# Patient Record
Sex: Female | Born: 1960 | Race: White | Hispanic: No | Marital: Single | Smoking: Never smoker
Health system: Southern US, Community
[De-identification: ages and names within clinical notes are randomized; demographics above are authoritative.]

## PROBLEM LIST (undated history)

## (undated) DIAGNOSIS — F32A Depression, unspecified: Secondary | ICD-10-CM

## (undated) DIAGNOSIS — I878 Other specified disorders of veins: Secondary | ICD-10-CM

## (undated) DIAGNOSIS — G8929 Other chronic pain: Secondary | ICD-10-CM

## (undated) DIAGNOSIS — E119 Type 2 diabetes mellitus without complications: Secondary | ICD-10-CM

## (undated) DIAGNOSIS — F419 Anxiety disorder, unspecified: Secondary | ICD-10-CM

## (undated) DIAGNOSIS — B019 Varicella without complication: Secondary | ICD-10-CM

## (undated) DIAGNOSIS — I10 Essential (primary) hypertension: Secondary | ICD-10-CM

## (undated) DIAGNOSIS — Z8639 Personal history of other endocrine, nutritional and metabolic disease: Secondary | ICD-10-CM

## (undated) DIAGNOSIS — G4730 Sleep apnea, unspecified: Secondary | ICD-10-CM

## (undated) DIAGNOSIS — Z8669 Personal history of other diseases of the nervous system and sense organs: Secondary | ICD-10-CM

## (undated) DIAGNOSIS — F329 Major depressive disorder, single episode, unspecified: Secondary | ICD-10-CM

## (undated) DIAGNOSIS — D649 Anemia, unspecified: Secondary | ICD-10-CM

## (undated) HISTORY — DX: Essential (primary) hypertension: I10

## (undated) HISTORY — DX: Depression, unspecified: F32.A

## (undated) HISTORY — DX: Personal history of other endocrine, nutritional and metabolic disease: Z86.39

## (undated) HISTORY — DX: Personal history of other diseases of the nervous system and sense organs: Z86.69

## (undated) HISTORY — DX: Varicella without complication: B01.9

## (undated) HISTORY — DX: Other specified disorders of veins: I87.8

## (undated) HISTORY — DX: Major depressive disorder, single episode, unspecified: F32.9

## (undated) HISTORY — DX: Type 2 diabetes mellitus without complications: E11.9

---
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
## 1989-02-24 HISTORY — PX: APPENDECTOMY: SHX54

## 1989-02-24 HISTORY — PX: CHOLECYSTECTOMY: SHX55

## 2004-02-25 DIAGNOSIS — E119 Type 2 diabetes mellitus without complications: Secondary | ICD-10-CM

## 2004-02-25 HISTORY — PX: OTHER SURGICAL HISTORY: SHX169

## 2004-02-25 HISTORY — DX: Type 2 diabetes mellitus without complications: E11.9

## 2007-02-25 HISTORY — PX: HERNIA REPAIR: SHX51

## 2012-02-25 HISTORY — PX: OTHER SURGICAL HISTORY: SHX169

## 2012-02-25 HISTORY — PX: BELOW KNEE LEG AMPUTATION: SUR23

## 2013-03-30 ENCOUNTER — Other Ambulatory Visit: Payer: Self-pay

## 2013-03-30 LAB — VANCOMYCIN, TROUGH: VANCOMYCIN, TROUGH: 19 ug/mL (ref 10–20)

## 2013-07-09 DIAGNOSIS — Z89519 Acquired absence of unspecified leg below knee: Secondary | ICD-10-CM | POA: Insufficient documentation

## 2013-07-10 DIAGNOSIS — G8929 Other chronic pain: Secondary | ICD-10-CM | POA: Insufficient documentation

## 2013-08-21 ENCOUNTER — Other Ambulatory Visit: Payer: Self-pay | Admitting: Family Medicine

## 2013-08-21 LAB — URINALYSIS, COMPLETE
Bilirubin,UR: NEGATIVE
GLUCOSE, UR: NEGATIVE mg/dL (ref 0–75)
Ketone: NEGATIVE
Nitrite: NEGATIVE
PH: 6 (ref 4.5–8.0)
RBC,UR: 305 /HPF (ref 0–5)
Specific Gravity: 1.015 (ref 1.003–1.030)
Squamous Epithelial: 1
WBC UR: 307 /HPF (ref 0–5)

## 2013-08-22 LAB — URINE CULTURE

## 2013-08-26 ENCOUNTER — Other Ambulatory Visit: Payer: Self-pay | Admitting: Family Medicine

## 2013-08-26 LAB — URINALYSIS, COMPLETE
Bacteria: NEGATIVE
Bilirubin,UR: NEGATIVE
Glucose,UR: NEGATIVE mg/dL (ref 0–75)
Ketone: NEGATIVE
Nitrite: NEGATIVE
Ph: 5 (ref 4.5–8.0)
Protein: NEGATIVE
Specific Gravity: 1.016 (ref 1.003–1.030)

## 2013-08-27 LAB — URINE CULTURE

## 2013-11-24 ENCOUNTER — Ambulatory Visit: Payer: Self-pay | Admitting: Internal Medicine

## 2013-11-27 ENCOUNTER — Inpatient Hospital Stay: Payer: Self-pay | Admitting: Internal Medicine

## 2013-11-27 ENCOUNTER — Ambulatory Visit: Payer: Self-pay | Admitting: Urology

## 2013-11-27 LAB — URINALYSIS, COMPLETE
Bilirubin,UR: NEGATIVE
GLUCOSE, UR: NEGATIVE mg/dL (ref 0–75)
KETONE: NEGATIVE
Nitrite: NEGATIVE
Ph: 5 (ref 4.5–8.0)
Protein: NEGATIVE
Specific Gravity: 1.011 (ref 1.003–1.030)
Squamous Epithelial: 1

## 2013-11-27 LAB — CBC WITH DIFFERENTIAL/PLATELET
Bands: 6 %
HCT: 29.1 % — AB (ref 40.0–52.0)
HGB: 8.9 g/dL — AB (ref 13.0–18.0)
Lymphocytes: 3 %
MCH: 30.9 pg (ref 26.0–34.0)
MCHC: 30.5 g/dL — AB (ref 32.0–36.0)
MCV: 101 fL — AB (ref 80–100)
Metamyelocyte: 1 %
Monocytes: 4 %
Myelocyte: 1 %
Platelet: 244 10*3/uL (ref 150–440)
RBC: 2.87 10*6/uL — ABNORMAL LOW (ref 4.40–5.90)
RDW: 14.5 % (ref 11.5–14.5)
Segmented Neutrophils: 85 %
WBC: 20.3 10*3/uL — AB (ref 3.8–10.6)

## 2013-11-27 LAB — COMPREHENSIVE METABOLIC PANEL
Albumin: 1.3 g/dL — ABNORMAL LOW (ref 3.4–5.0)
Alkaline Phosphatase: 142 U/L — ABNORMAL HIGH
Anion Gap: 11 (ref 7–16)
BUN: 52 mg/dL — ABNORMAL HIGH (ref 7–18)
Bilirubin,Total: 0.4 mg/dL (ref 0.2–1.0)
CALCIUM: 6.7 mg/dL — AB (ref 8.5–10.1)
CO2: 12 mmol/L — AB (ref 21–32)
CREATININE: 2.48 mg/dL — AB (ref 0.60–1.30)
Chloride: 110 mmol/L — ABNORMAL HIGH (ref 98–107)
EGFR (African American): 35 — ABNORMAL LOW
EGFR (Non-African Amer.): 29 — ABNORMAL LOW
GLUCOSE: 81 mg/dL (ref 65–99)
Osmolality: 279 (ref 275–301)
Potassium: 5.4 mmol/L — ABNORMAL HIGH (ref 3.5–5.1)
SGOT(AST): 43 U/L — ABNORMAL HIGH (ref 15–37)
SGPT (ALT): 31 U/L
Sodium: 133 mmol/L — ABNORMAL LOW (ref 136–145)
TOTAL PROTEIN: 4.7 g/dL — AB (ref 6.4–8.2)

## 2013-11-27 LAB — TROPONIN I: Troponin-I: <0.02 ng/mL

## 2013-11-27 LAB — MAGNESIUM: Magnesium: 1.9 mg/dL

## 2013-11-27 LAB — PHOSPHORUS: PHOSPHORUS: 7.4 mg/dL — AB (ref 2.5–4.9)

## 2013-11-28 LAB — CBC WITH DIFFERENTIAL/PLATELET
BASOS ABS: 0 10*3/uL (ref 0.0–0.1)
Basophil %: 0.2 %
Eosinophil #: 0.1 10*3/uL (ref 0.0–0.7)
Eosinophil %: 0.2 %
HCT: 31.9 % — ABNORMAL LOW (ref 40.0–52.0)
HGB: 9.6 g/dL — ABNORMAL LOW (ref 13.0–18.0)
LYMPHS ABS: 0.7 10*3/uL — AB (ref 1.0–3.6)
Lymphocyte %: 3.5 %
MCH: 30.2 pg (ref 26.0–34.0)
MCHC: 30.2 g/dL — ABNORMAL LOW (ref 32.0–36.0)
MCV: 100 fL (ref 80–100)
Monocyte #: 0.6 x10 3/mm (ref 0.2–1.0)
Monocyte %: 2.7 %
Neutrophil #: 20.3 10*3/uL — ABNORMAL HIGH (ref 1.4–6.5)
Neutrophil %: 93.4 %
PLATELETS: 283 10*3/uL (ref 150–440)
RBC: 3.19 10*6/uL — AB (ref 4.40–5.90)
RDW: 13.9 % (ref 11.5–14.5)
WBC: 21.7 10*3/uL — ABNORMAL HIGH (ref 3.8–10.6)

## 2013-11-28 LAB — COMPREHENSIVE METABOLIC PANEL
ALBUMIN: 1.3 g/dL — AB (ref 3.4–5.0)
ALK PHOS: 157 U/L — AB
ALT: 35 U/L
ANION GAP: 9 (ref 7–16)
BILIRUBIN TOTAL: 0.6 mg/dL (ref 0.2–1.0)
BUN: 45 mg/dL — ABNORMAL HIGH (ref 7–18)
Calcium, Total: 6.4 mg/dL — CL (ref 8.5–10.1)
Chloride: 112 mmol/L — ABNORMAL HIGH (ref 98–107)
Co2: 14 mmol/L — ABNORMAL LOW (ref 21–32)
Creatinine: 2.01 mg/dL — ABNORMAL HIGH (ref 0.60–1.30)
EGFR (African American): 45 — ABNORMAL LOW
EGFR (Non-African Amer.): 37 — ABNORMAL LOW
Glucose: 106 mg/dL — ABNORMAL HIGH (ref 65–99)
OSMOLALITY: 282 (ref 275–301)
Potassium: 4.7 mmol/L (ref 3.5–5.1)
SGOT(AST): 44 U/L — ABNORMAL HIGH (ref 15–37)
SODIUM: 135 mmol/L — AB (ref 136–145)
Total Protein: 5.5 g/dL — ABNORMAL LOW (ref 6.4–8.2)

## 2013-11-28 LAB — IRON AND TIBC
IRON BIND. CAP.(TOTAL): 30 ug/dL — AB (ref 250–450)
Iron Saturation: 73 %
Iron: 22 ug/dL — ABNORMAL LOW (ref 65–175)
UNBOUND IRON-BIND. CAP.: 8 ug/dL

## 2013-11-28 LAB — PROTIME-INR
INR: 2.6
PROTHROMBIN TIME: 27 s — AB (ref 11.5–14.7)

## 2013-11-28 LAB — BASIC METABOLIC PANEL
ANION GAP: 4 — AB (ref 7–16)
BUN: 41 mg/dL — ABNORMAL HIGH (ref 7–18)
CALCIUM: 6.7 mg/dL — AB (ref 8.5–10.1)
CO2: 19 mmol/L — AB (ref 21–32)
Chloride: 114 mmol/L — ABNORMAL HIGH (ref 98–107)
Creatinine: 1.7 mg/dL — ABNORMAL HIGH (ref 0.60–1.30)
EGFR (African American): 55 — ABNORMAL LOW
EGFR (Non-African Amer.): 45 — ABNORMAL LOW
GLUCOSE: 103 mg/dL — AB (ref 65–99)
OSMOLALITY: 284 (ref 275–301)
Potassium: 4.6 mmol/L (ref 3.5–5.1)
Sodium: 137 mmol/L (ref 136–145)

## 2013-11-28 LAB — AMMONIA: Ammonia, Plasma: 56 mcmol/L — ABNORMAL HIGH (ref 11–32)

## 2013-11-28 LAB — TSH: Thyroid Stimulating Horm: 2.08 u[IU]/mL

## 2013-11-28 LAB — FERRITIN: Ferritin (ARMC): 1022 ng/mL — ABNORMAL HIGH (ref 8–388)

## 2013-11-28 LAB — SEDIMENTATION RATE: ERYTHROCYTE SED RATE: 28 mm/h — AB (ref 0–20)

## 2013-11-29 LAB — COMPREHENSIVE METABOLIC PANEL
ALT: 33 U/L
AST: 31 U/L (ref 15–37)
Albumin: 1 g/dL — ABNORMAL LOW (ref 3.4–5.0)
Alkaline Phosphatase: 124 U/L — ABNORMAL HIGH
Anion Gap: 7 (ref 7–16)
BUN: 36 mg/dL — ABNORMAL HIGH (ref 7–18)
Bilirubin,Total: 0.3 mg/dL (ref 0.2–1.0)
CHLORIDE: 116 mmol/L — AB (ref 98–107)
CREATININE: 1.44 mg/dL — AB (ref 0.60–1.30)
Calcium, Total: 6.5 mg/dL — CL (ref 8.5–10.1)
Co2: 16 mmol/L — ABNORMAL LOW (ref 21–32)
EGFR (African American): >60
EGFR (Non-African Amer.): 55 — ABNORMAL LOW
Glucose: 152 mg/dL — ABNORMAL HIGH (ref 65–99)
Osmolality: 289 (ref 275–301)
Potassium: 4.5 mmol/L (ref 3.5–5.1)
SODIUM: 139 mmol/L (ref 136–145)
TOTAL PROTEIN: 4.6 g/dL — AB (ref 6.4–8.2)

## 2013-11-29 LAB — WBC: WBC: 15.1 10*3/uL — ABNORMAL HIGH (ref 3.8–10.6)

## 2013-11-29 LAB — ALBUMIN: Albumin: 1 g/dL — ABNORMAL LOW (ref 3.4–5.0)

## 2013-11-29 LAB — PROTIME-INR
INR: 2.4
Prothrombin Time: 25.6 secs — ABNORMAL HIGH (ref 11.5–14.7)

## 2013-11-30 LAB — BASIC METABOLIC PANEL
Anion Gap: 5 — ABNORMAL LOW (ref 7–16)
BUN: 33 mg/dL — AB (ref 7–18)
CALCIUM: 6.7 mg/dL — AB (ref 8.5–10.1)
Chloride: 119 mmol/L — ABNORMAL HIGH (ref 98–107)
Co2: 19 mmol/L — ABNORMAL LOW (ref 21–32)
Creatinine: 1.15 mg/dL (ref 0.60–1.30)
EGFR (African American): >60
EGFR (Non-African Amer.): >60
GLUCOSE: 93 mg/dL (ref 65–99)
OSMOLALITY: 292 (ref 275–301)
POTASSIUM: 4.7 mmol/L (ref 3.5–5.1)
SODIUM: 143 mmol/L (ref 136–145)

## 2013-11-30 LAB — CBC WITH DIFFERENTIAL/PLATELET
BANDS NEUTROPHIL: 3 %
EOS PCT: 2 %
HCT: 27 % — AB (ref 40.0–52.0)
HGB: 8.5 g/dL — ABNORMAL LOW (ref 13.0–18.0)
LYMPHS PCT: 7 %
MCH: 31.1 pg (ref 26.0–34.0)
MCHC: 31.6 g/dL — AB (ref 32.0–36.0)
MCV: 99 fL (ref 80–100)
MONOS PCT: 4 %
Metamyelocyte: 2 %
Myelocyte: 3 %
Platelet: 147 10*3/uL — ABNORMAL LOW (ref 150–440)
Promyelocyte: 1 %
RBC: 2.75 10*6/uL — ABNORMAL LOW (ref 4.40–5.90)
RDW: 14.4 % (ref 11.5–14.5)
SEGMENTED NEUTROPHILS: 78 %
WBC: 9 10*3/uL (ref 3.8–10.6)

## 2013-12-01 LAB — URINE CULTURE

## 2013-12-02 LAB — CBC WITH DIFFERENTIAL/PLATELET
BASOS ABS: 0 10*3/uL (ref 0.0–0.1)
BASOS PCT: 0.1 %
Eosinophil #: 0.2 10*3/uL (ref 0.0–0.7)
Eosinophil %: 2 %
HCT: 28.4 % — AB (ref 40.0–52.0)
HGB: 8.9 g/dL — ABNORMAL LOW (ref 13.0–18.0)
Lymphocyte #: 0.9 10*3/uL — ABNORMAL LOW (ref 1.0–3.6)
Lymphocyte %: 8.3 %
MCH: 31 pg (ref 26.0–34.0)
MCHC: 31.5 g/dL — ABNORMAL LOW (ref 32.0–36.0)
MCV: 98 fL (ref 80–100)
Monocyte #: 0.6 x10 3/mm (ref 0.2–1.0)
Monocyte %: 5.4 %
Neutrophil #: 9.5 10*3/uL — ABNORMAL HIGH (ref 1.4–6.5)
Neutrophil %: 84.2 %
PLATELETS: 177 10*3/uL (ref 150–440)
RBC: 2.89 10*6/uL — ABNORMAL LOW (ref 4.40–5.90)
RDW: 14.7 % — AB (ref 11.5–14.5)
WBC: 11.3 10*3/uL — AB (ref 3.8–10.6)

## 2013-12-02 LAB — CULTURE, BLOOD (SINGLE)

## 2013-12-02 LAB — PROTIME-INR
INR: 2.1
Prothrombin Time: 23.4 secs — ABNORMAL HIGH (ref 11.5–14.7)

## 2013-12-02 LAB — WOUND AEROBIC CULTURE

## 2013-12-03 LAB — CBC WITH DIFFERENTIAL/PLATELET
BASOS ABS: 0.1 10*3/uL (ref 0.0–0.1)
Basophil %: 0.6 %
EOS PCT: 1.8 %
Eosinophil #: 0.2 10*3/uL (ref 0.0–0.7)
HCT: 29.4 % — ABNORMAL LOW (ref 40.0–52.0)
HGB: 8.9 g/dL — ABNORMAL LOW (ref 13.0–18.0)
Lymphocyte #: 1.2 10*3/uL (ref 1.0–3.6)
Lymphocyte %: 8.7 %
MCH: 29.8 pg (ref 26.0–34.0)
MCHC: 30.3 g/dL — AB (ref 32.0–36.0)
MCV: 98 fL (ref 80–100)
MONO ABS: 0.6 x10 3/mm (ref 0.2–1.0)
Monocyte %: 4.1 %
Neutrophil #: 11.6 10*3/uL — ABNORMAL HIGH (ref 1.4–6.5)
Neutrophil %: 84.8 %
PLATELETS: 232 10*3/uL (ref 150–440)
RBC: 2.99 10*6/uL — ABNORMAL LOW (ref 4.40–5.90)
RDW: 14.8 % — ABNORMAL HIGH (ref 11.5–14.5)
WBC: 13.7 10*3/uL — ABNORMAL HIGH (ref 3.8–10.6)

## 2013-12-03 LAB — WOUND AEROBIC CULTURE

## 2013-12-03 LAB — PROTIME-INR
INR: 1.8
PROTHROMBIN TIME: 20.8 s — AB (ref 11.5–14.7)

## 2013-12-04 LAB — CBC WITH DIFFERENTIAL/PLATELET
BANDS NEUTROPHIL: 1 %
Comment - H1-Com5: NORMAL
EOS PCT: 2 %
HCT: 29.3 % — ABNORMAL LOW (ref 40.0–52.0)
HGB: 9 g/dL — ABNORMAL LOW (ref 13.0–18.0)
Lymphocytes: 4 %
MCH: 30.1 pg (ref 26.0–34.0)
MCHC: 30.8 g/dL — AB (ref 32.0–36.0)
MCV: 98 fL (ref 80–100)
Metamyelocyte: 2 %
Monocytes: 5 %
Myelocyte: 3 %
PLATELETS: 261 10*3/uL (ref 150–440)
RBC: 3 10*6/uL — ABNORMAL LOW (ref 4.40–5.90)
RDW: 14.6 % — ABNORMAL HIGH (ref 11.5–14.5)
Segmented Neutrophils: 83 %
WBC: 15.3 10*3/uL — ABNORMAL HIGH (ref 3.8–10.6)

## 2013-12-04 LAB — PROTIME-INR
INR: 2
Prothrombin Time: 21.8 secs — ABNORMAL HIGH (ref 11.5–14.7)

## 2013-12-05 LAB — COMPREHENSIVE METABOLIC PANEL
ALBUMIN: 1 g/dL — AB (ref 3.4–5.0)
AST: 22 U/L (ref 15–37)
Alkaline Phosphatase: 118 U/L — ABNORMAL HIGH
Anion Gap: 0 — ABNORMAL LOW (ref 7–16)
BUN: 14 mg/dL (ref 7–18)
Bilirubin,Total: 0.2 mg/dL (ref 0.2–1.0)
CREATININE: 0.74 mg/dL (ref 0.60–1.30)
Calcium, Total: 6.4 mg/dL — CL (ref 8.5–10.1)
Chloride: 119 mmol/L — ABNORMAL HIGH (ref 98–107)
Co2: 23 mmol/L (ref 21–32)
EGFR (African American): >60
EGFR (Non-African Amer.): >60
Glucose: 103 mg/dL — ABNORMAL HIGH (ref 65–99)
Osmolality: 284 (ref 275–301)
Potassium: 4.2 mmol/L (ref 3.5–5.1)
SGPT (ALT): 25 U/L
SODIUM: 142 mmol/L (ref 136–145)
Total Protein: 4.6 g/dL — ABNORMAL LOW (ref 6.4–8.2)

## 2013-12-06 LAB — CULTURE, BLOOD (SINGLE)

## 2013-12-08 LAB — ALBUMIN: ALBUMIN: 1 g/dL — AB (ref 3.4–5.0)

## 2013-12-08 LAB — BASIC METABOLIC PANEL
Anion Gap: 4 — ABNORMAL LOW (ref 7–16)
BUN: 11 mg/dL (ref 7–18)
CALCIUM: 6.3 mg/dL — AB (ref 8.5–10.1)
CHLORIDE: 113 mmol/L — AB (ref 98–107)
Co2: 24 mmol/L (ref 21–32)
Creatinine: 0.68 mg/dL (ref 0.60–1.30)
Glucose: 135 mg/dL — ABNORMAL HIGH (ref 65–99)
Osmolality: 283 (ref 275–301)
POTASSIUM: 4.2 mmol/L (ref 3.5–5.1)
Sodium: 141 mmol/L (ref 136–145)

## 2013-12-08 LAB — PROTIME-INR
INR: 2
Prothrombin Time: 21.9 secs — ABNORMAL HIGH (ref 11.5–14.7)

## 2013-12-08 LAB — PHOSPHORUS: Phosphorus: 1.9 mg/dL — ABNORMAL LOW (ref 2.5–4.9)

## 2013-12-08 LAB — MAGNESIUM: MAGNESIUM: 1.6 mg/dL — AB

## 2013-12-09 LAB — COMPREHENSIVE METABOLIC PANEL
Albumin: 0.9 g/dL — ABNORMAL LOW (ref 3.4–5.0)
Alkaline Phosphatase: 129 U/L — ABNORMAL HIGH
Anion Gap: 5 — ABNORMAL LOW (ref 7–16)
BUN: 10 mg/dL (ref 7–18)
Bilirubin,Total: 0.2 mg/dL (ref 0.2–1.0)
Calcium, Total: 6.4 mg/dL — CL (ref 8.5–10.1)
Chloride: 111 mmol/L — ABNORMAL HIGH (ref 98–107)
Co2: 24 mmol/L (ref 21–32)
Creatinine: 0.73 mg/dL (ref 0.60–1.30)
EGFR (African American): >60
EGFR (Non-African Amer.): >60
Glucose: 134 mg/dL — ABNORMAL HIGH (ref 65–99)
Osmolality: 280 (ref 275–301)
Potassium: 4.4 mmol/L (ref 3.5–5.1)
SGOT(AST): 27 U/L (ref 15–37)
SGPT (ALT): 26 U/L
Sodium: 140 mmol/L (ref 136–145)
Total Protein: 4.8 g/dL — ABNORMAL LOW (ref 6.4–8.2)

## 2013-12-09 LAB — CBC WITH DIFFERENTIAL/PLATELET
BASOS PCT: 0.5 %
Basophil #: 0.1 10*3/uL (ref 0.0–0.1)
EOS ABS: 0.3 10*3/uL (ref 0.0–0.7)
Eosinophil %: 2.3 %
HCT: 28.4 % — ABNORMAL LOW (ref 40.0–52.0)
HGB: 8.7 g/dL — ABNORMAL LOW (ref 13.0–18.0)
LYMPHS ABS: 1.4 10*3/uL (ref 1.0–3.6)
Lymphocyte %: 12.1 %
MCH: 29.5 pg (ref 26.0–34.0)
MCHC: 30.7 g/dL — ABNORMAL LOW (ref 32.0–36.0)
MCV: 96 fL (ref 80–100)
MONO ABS: 0.8 x10 3/mm (ref 0.2–1.0)
MONOS PCT: 7.1 %
NEUTROS PCT: 78 %
Neutrophil #: 8.8 10*3/uL — ABNORMAL HIGH (ref 1.4–6.5)
Platelet: 288 10*3/uL (ref 150–440)
RBC: 2.95 10*6/uL — ABNORMAL LOW (ref 4.40–5.90)
RDW: 14.5 % (ref 11.5–14.5)
WBC: 11.3 10*3/uL — ABNORMAL HIGH (ref 3.8–10.6)

## 2013-12-09 LAB — MAGNESIUM: Magnesium: 1.7 mg/dL — ABNORMAL LOW

## 2013-12-09 LAB — PHOSPHORUS: Phosphorus: 1.9 mg/dL — ABNORMAL LOW (ref 2.5–4.9)

## 2013-12-10 LAB — PHOSPHORUS
PHOSPHORUS: 2.3 mg/dL — AB (ref 2.5–4.9)
Phosphorus: 2.1 mg/dL — ABNORMAL LOW (ref 2.5–4.9)

## 2013-12-10 LAB — BASIC METABOLIC PANEL
Anion Gap: 5 — ABNORMAL LOW (ref 7–16)
BUN: 10 mg/dL (ref 7–18)
CO2: 24 mmol/L (ref 21–32)
Calcium, Total: 6.3 mg/dL — CL (ref 8.5–10.1)
Chloride: 108 mmol/L — ABNORMAL HIGH (ref 98–107)
Creatinine: 0.76 mg/dL (ref 0.60–1.30)
EGFR (African American): >60
Glucose: 112 mg/dL — ABNORMAL HIGH (ref 65–99)
Osmolality: 274 (ref 275–301)
POTASSIUM: 4.4 mmol/L (ref 3.5–5.1)
SODIUM: 137 mmol/L (ref 136–145)

## 2013-12-10 LAB — MAGNESIUM: MAGNESIUM: 1.8 mg/dL

## 2013-12-11 LAB — COMPREHENSIVE METABOLIC PANEL
Albumin: 0.8 g/dL — ABNORMAL LOW (ref 3.4–5.0)
Alkaline Phosphatase: 110 U/L
Anion Gap: 5 — ABNORMAL LOW (ref 7–16)
BUN: 13 mg/dL (ref 7–18)
Bilirubin,Total: 0.1 mg/dL — ABNORMAL LOW (ref 0.2–1.0)
CHLORIDE: 108 mmol/L — AB (ref 98–107)
CO2: 24 mmol/L (ref 21–32)
Calcium, Total: 6 mg/dL — CL (ref 8.5–10.1)
Creatinine: 0.8 mg/dL (ref 0.60–1.30)
Glucose: 114 mg/dL — ABNORMAL HIGH (ref 65–99)
OSMOLALITY: 275 (ref 275–301)
POTASSIUM: 4.6 mmol/L (ref 3.5–5.1)
SGOT(AST): 21 U/L (ref 15–37)
SGPT (ALT): 20 U/L
Sodium: 137 mmol/L (ref 136–145)
TOTAL PROTEIN: 4.1 g/dL — AB (ref 6.4–8.2)

## 2013-12-11 LAB — MAGNESIUM: Magnesium: 2 mg/dL

## 2013-12-11 LAB — PHOSPHORUS
PHOSPHORUS: 2.6 mg/dL (ref 2.5–4.9)
Phosphorus: 2.4 mg/dL — ABNORMAL LOW (ref 2.5–4.9)

## 2013-12-12 LAB — BASIC METABOLIC PANEL
Anion Gap: 5 — ABNORMAL LOW (ref 7–16)
BUN: 17 mg/dL (ref 7–18)
CALCIUM: 6.3 mg/dL — AB (ref 8.5–10.1)
CO2: 23 mmol/L (ref 21–32)
Chloride: 108 mmol/L — ABNORMAL HIGH (ref 98–107)
Creatinine: 0.89 mg/dL (ref 0.60–1.30)
EGFR (African American): >60
Glucose: 118 mg/dL — ABNORMAL HIGH (ref 65–99)
Osmolality: 275 (ref 275–301)
Potassium: 4.6 mmol/L (ref 3.5–5.1)
Sodium: 136 mmol/L (ref 136–145)

## 2013-12-12 LAB — MAGNESIUM: Magnesium: 2.1 mg/dL

## 2013-12-12 LAB — PHOSPHORUS: Phosphorus: 3.2 mg/dL (ref 2.5–4.9)

## 2013-12-13 LAB — COMPREHENSIVE METABOLIC PANEL
Albumin: 1 g/dL — ABNORMAL LOW (ref 3.4–5.0)
Alkaline Phosphatase: 130 U/L — ABNORMAL HIGH
Anion Gap: 3 — ABNORMAL LOW (ref 7–16)
BUN: 23 mg/dL — ABNORMAL HIGH (ref 7–18)
Bilirubin,Total: 0.1 mg/dL — ABNORMAL LOW (ref 0.2–1.0)
CHLORIDE: 108 mmol/L — AB (ref 98–107)
Calcium, Total: 6.5 mg/dL — CL (ref 8.5–10.1)
Co2: 25 mmol/L (ref 21–32)
Creatinine: 1 mg/dL (ref 0.60–1.30)
GLUCOSE: 112 mg/dL — AB (ref 65–99)
Osmolality: 276 (ref 275–301)
POTASSIUM: 4.4 mmol/L (ref 3.5–5.1)
SGOT(AST): 23 U/L (ref 15–37)
SGPT (ALT): 22 U/L
SODIUM: 136 mmol/L (ref 136–145)
Total Protein: 4.9 g/dL — ABNORMAL LOW (ref 6.4–8.2)

## 2013-12-13 LAB — MAGNESIUM: Magnesium: 2 mg/dL

## 2013-12-13 LAB — PHOSPHORUS: Phosphorus: 2.5 mg/dL (ref 2.5–4.9)

## 2013-12-13 LAB — RAPID HIV SCREEN (HIV 1/2 AB+AG)

## 2013-12-14 DIAGNOSIS — I83029 Varicose veins of left lower extremity with ulcer of unspecified site: Secondary | ICD-10-CM | POA: Insufficient documentation

## 2013-12-14 DIAGNOSIS — L97929 Non-pressure chronic ulcer of unspecified part of left lower leg with unspecified severity: Secondary | ICD-10-CM

## 2013-12-25 ENCOUNTER — Ambulatory Visit: Payer: Self-pay | Admitting: Internal Medicine

## 2014-03-07 ENCOUNTER — Inpatient Hospital Stay
Admission: AD | Admit: 2014-03-07 | Discharge: 2014-03-27 | Disposition: A | Discharge status: Home Health | Payer: Self-pay | Source: Ambulatory Visit | Attending: Internal Medicine | Admitting: Internal Medicine

## 2014-03-08 LAB — COMPREHENSIVE METABOLIC PANEL
ALT: 23 U/L (ref 0–53)
AST: 45 U/L — ABNORMAL HIGH (ref 0–37)
Albumin: 2.5 g/dL — ABNORMAL LOW (ref 3.5–5.2)
Alkaline Phosphatase: 121 U/L — ABNORMAL HIGH (ref 39–117)
Anion gap: 12 (ref 5–15)
BUN: 36 mg/dL — ABNORMAL HIGH (ref 6–23)
CALCIUM: 8.5 mg/dL (ref 8.4–10.5)
CO2: 30 mmol/L (ref 19–32)
CREATININE: 0.84 mg/dL (ref 0.50–1.35)
Chloride: 92 mEq/L — ABNORMAL LOW (ref 96–112)
GFR calc non Af Amer: >90 mL/min (ref >90)
GLUCOSE: 121 mg/dL — AB (ref 70–99)
Potassium: 4.3 mmol/L (ref 3.5–5.1)
SODIUM: 134 mmol/L — AB (ref 135–145)
Total Bilirubin: 0.8 mg/dL (ref 0.3–1.2)
Total Protein: 8 g/dL (ref 6.0–8.3)

## 2014-03-08 LAB — LIPID PANEL
CHOL/HDL RATIO: 3.7 ratio
Cholesterol: 100 mg/dL (ref 0–200)
HDL: 27 mg/dL — AB (ref >39)
LDL CALC: 64 mg/dL (ref 0–99)
Triglycerides: 45 mg/dL (ref <150)
VLDL: 9 mg/dL (ref 0–40)

## 2014-03-08 LAB — CBC WITH DIFFERENTIAL/PLATELET
Basophils Absolute: 0 10*3/uL (ref 0.0–0.1)
Basophils Relative: 0 % (ref 0–1)
EOS PCT: 1 % (ref 0–5)
Eosinophils Absolute: 0.1 10*3/uL (ref 0.0–0.7)
HEMATOCRIT: 29.1 % — AB (ref 39.0–52.0)
HEMOGLOBIN: 9.6 g/dL — AB (ref 13.0–17.0)
LYMPHS ABS: 1.9 10*3/uL (ref 0.7–4.0)
Lymphocytes Relative: 16 % (ref 12–46)
MCH: 27.9 pg (ref 26.0–34.0)
MCHC: 33 g/dL (ref 30.0–36.0)
MCV: 84.6 fL (ref 78.0–100.0)
MONO ABS: 0.8 10*3/uL (ref 0.1–1.0)
MONOS PCT: 7 % (ref 3–12)
NEUTROS ABS: 8.7 10*3/uL — AB (ref 1.7–7.7)
Neutrophils Relative %: 76 % (ref 43–77)
Platelets: 472 10*3/uL — ABNORMAL HIGH (ref 150–400)
RBC: 3.44 MIL/uL — AB (ref 4.22–5.81)
RDW: 15.7 % — ABNORMAL HIGH (ref 11.5–15.5)
WBC: 11.6 10*3/uL — AB (ref 4.0–10.5)

## 2014-03-08 LAB — T4, FREE: FREE T4: 1.33 ng/dL (ref 0.80–1.80)

## 2014-03-08 LAB — PHOSPHORUS: Phosphorus: 6.2 mg/dL — ABNORMAL HIGH (ref 2.3–4.6)

## 2014-03-08 LAB — HEMOGLOBIN A1C
Hgb A1c MFr Bld: 5.3 % (ref <5.7)
Hgb A1c MFr Bld: 5.4 % (ref <5.7)
Mean Plasma Glucose: 105 mg/dL (ref <117)
Mean Plasma Glucose: 108 mg/dL (ref <117)

## 2014-03-08 LAB — VITAMIN B12: VITAMIN B 12: 1377 pg/mL — AB (ref 211–911)

## 2014-03-08 LAB — MAGNESIUM: MAGNESIUM: 1.4 mg/dL — AB (ref 1.5–2.5)

## 2014-03-08 LAB — TSH: TSH: 4.142 u[IU]/mL (ref 0.350–4.500)

## 2014-03-09 LAB — CBC WITH DIFFERENTIAL/PLATELET
BASOS PCT: 0 % (ref 0–1)
Basophils Absolute: 0 10*3/uL (ref 0.0–0.1)
Eosinophils Absolute: 0.1 10*3/uL (ref 0.0–0.7)
Eosinophils Relative: 1 % (ref 0–5)
HEMATOCRIT: 26.4 % — AB (ref 39.0–52.0)
Hemoglobin: 8.4 g/dL — ABNORMAL LOW (ref 13.0–17.0)
LYMPHS ABS: 1.9 10*3/uL (ref 0.7–4.0)
LYMPHS PCT: 17 % (ref 12–46)
MCH: 27.3 pg (ref 26.0–34.0)
MCHC: 31.8 g/dL (ref 30.0–36.0)
MCV: 85.7 fL (ref 78.0–100.0)
MONO ABS: 0.7 10*3/uL (ref 0.1–1.0)
MONOS PCT: 6 % (ref 3–12)
NEUTROS PCT: 76 % (ref 43–77)
Neutro Abs: 8.3 10*3/uL — ABNORMAL HIGH (ref 1.7–7.7)
Platelets: 382 10*3/uL (ref 150–400)
RBC: 3.08 MIL/uL — ABNORMAL LOW (ref 4.22–5.81)
RDW: 15.7 % — ABNORMAL HIGH (ref 11.5–15.5)
WBC: 11 10*3/uL — AB (ref 4.0–10.5)

## 2014-03-09 LAB — FOLATE RBC
Folate, Hemolysate: >619 ng/mL
Folate, RBC: >2134 ng/mL (ref >498)
Hematocrit: 29 % — ABNORMAL LOW (ref 37.5–51.0)

## 2014-03-09 LAB — BASIC METABOLIC PANEL
Anion gap: 13 (ref 5–15)
BUN: 54 mg/dL — ABNORMAL HIGH (ref 6–23)
CHLORIDE: 90 meq/L — AB (ref 96–112)
CO2: 31 mmol/L (ref 19–32)
CREATININE: 0.84 mg/dL (ref 0.50–1.35)
Calcium: 8.4 mg/dL (ref 8.4–10.5)
GFR calc non Af Amer: >90 mL/min (ref >90)
Glucose, Bld: 115 mg/dL — ABNORMAL HIGH (ref 70–99)
POTASSIUM: 4.6 mmol/L (ref 3.5–5.1)
Sodium: 134 mmol/L — ABNORMAL LOW (ref 135–145)

## 2014-03-09 LAB — PREALBUMIN: PREALBUMIN: 7.3 mg/dL — AB (ref 17.0–34.0)

## 2014-03-09 LAB — PROTIME-INR
INR: 1.27 (ref 0.00–1.49)
Prothrombin Time: 16 seconds — ABNORMAL HIGH (ref 11.6–15.2)

## 2014-03-09 LAB — MAGNESIUM: MAGNESIUM: 1.6 mg/dL (ref 1.5–2.5)

## 2014-03-09 LAB — IRON AND TIBC
Iron: 26 ug/dL — ABNORMAL LOW (ref 42–165)
Saturation Ratios: 15 % — ABNORMAL LOW (ref 20–55)
TIBC: 169 ug/dL — AB (ref 215–435)
UIBC: 143 ug/dL (ref 125–400)

## 2014-03-09 LAB — PHOSPHORUS: Phosphorus: 5.1 mg/dL — ABNORMAL HIGH (ref 2.3–4.6)

## 2014-03-09 LAB — FERRITIN: Ferritin: 558 ng/mL — ABNORMAL HIGH (ref 22–322)

## 2014-03-10 LAB — BASIC METABOLIC PANEL
Anion gap: 11 (ref 5–15)
BUN: 51 mg/dL — AB (ref 6–23)
CALCIUM: 8 mg/dL — AB (ref 8.4–10.5)
CO2: 29 mmol/L (ref 19–32)
CREATININE: 0.76 mg/dL (ref 0.50–1.35)
Chloride: 94 mEq/L — ABNORMAL LOW (ref 96–112)
GFR calc Af Amer: >90 mL/min (ref >90)
GFR calc non Af Amer: >90 mL/min (ref >90)
Glucose, Bld: 116 mg/dL — ABNORMAL HIGH (ref 70–99)
Potassium: 3.8 mmol/L (ref 3.5–5.1)
SODIUM: 134 mmol/L — AB (ref 135–145)

## 2014-03-10 LAB — CBC
HEMATOCRIT: 26.6 % — AB (ref 39.0–52.0)
HEMOGLOBIN: 8.4 g/dL — AB (ref 13.0–17.0)
MCH: 26.8 pg (ref 26.0–34.0)
MCHC: 31.6 g/dL (ref 30.0–36.0)
MCV: 85 fL (ref 78.0–100.0)
PLATELETS: 389 10*3/uL (ref 150–400)
RBC: 3.13 MIL/uL — ABNORMAL LOW (ref 4.22–5.81)
RDW: 15.4 % (ref 11.5–15.5)
WBC: 9.6 10*3/uL (ref 4.0–10.5)

## 2014-03-10 LAB — SEDIMENTATION RATE: Sed Rate: 135 mm/hr — ABNORMAL HIGH (ref 0–16)

## 2014-03-10 LAB — VITAMIN D 25 HYDROXY (VIT D DEFICIENCY, FRACTURES): VIT D 25 HYDROXY: 14.5 ng/mL — AB (ref 30.0–100.0)

## 2014-03-11 LAB — C-REACTIVE PROTEIN: CRP: 6.5 mg/dL — AB (ref <0.60)

## 2014-03-12 LAB — COMPREHENSIVE METABOLIC PANEL
ALK PHOS: 108 U/L (ref 39–117)
ALT: 25 U/L (ref 0–53)
AST: 31 U/L (ref 0–37)
Albumin: 2.3 g/dL — ABNORMAL LOW (ref 3.5–5.2)
Anion gap: 10 (ref 5–15)
BUN: 46 mg/dL — AB (ref 6–23)
CALCIUM: 8.3 mg/dL — AB (ref 8.4–10.5)
CO2: 28 mmol/L (ref 19–32)
Chloride: 95 mEq/L — ABNORMAL LOW (ref 96–112)
Creatinine, Ser: 0.75 mg/dL (ref 0.50–1.35)
GFR calc non Af Amer: >90 mL/min (ref >90)
Glucose, Bld: 117 mg/dL — ABNORMAL HIGH (ref 70–99)
POTASSIUM: 4 mmol/L (ref 3.5–5.1)
Sodium: 133 mmol/L — ABNORMAL LOW (ref 135–145)
Total Bilirubin: 0.4 mg/dL (ref 0.3–1.2)
Total Protein: 7.6 g/dL (ref 6.0–8.3)

## 2014-03-12 LAB — MAGNESIUM: MAGNESIUM: 1.6 mg/dL (ref 1.5–2.5)

## 2014-03-12 LAB — PHOSPHORUS: Phosphorus: 4.4 mg/dL (ref 2.3–4.6)

## 2014-03-15 LAB — CBC
HCT: 29.4 % — ABNORMAL LOW (ref 39.0–52.0)
HEMOGLOBIN: 9.2 g/dL — AB (ref 13.0–17.0)
MCH: 26.3 pg (ref 26.0–34.0)
MCHC: 31.3 g/dL (ref 30.0–36.0)
MCV: 84 fL (ref 78.0–100.0)
Platelets: 408 10*3/uL — ABNORMAL HIGH (ref 150–400)
RBC: 3.5 MIL/uL — AB (ref 4.22–5.81)
RDW: 15.6 % — AB (ref 11.5–15.5)
WBC: 10.3 10*3/uL (ref 4.0–10.5)

## 2014-03-15 LAB — BASIC METABOLIC PANEL
Anion gap: 11 (ref 5–15)
BUN: 60 mg/dL — AB (ref 6–23)
CHLORIDE: 95 meq/L — AB (ref 96–112)
CO2: 28 mmol/L (ref 19–32)
Calcium: 8.3 mg/dL — ABNORMAL LOW (ref 8.4–10.5)
Creatinine, Ser: 0.86 mg/dL (ref 0.50–1.35)
GFR calc non Af Amer: >90 mL/min (ref >90)
Glucose, Bld: 144 mg/dL — ABNORMAL HIGH (ref 70–99)
POTASSIUM: 4 mmol/L (ref 3.5–5.1)
SODIUM: 134 mmol/L — AB (ref 135–145)

## 2014-03-16 LAB — RENAL FUNCTION PANEL
Albumin: 2.4 g/dL — ABNORMAL LOW (ref 3.5–5.2)
Anion gap: 7 (ref 5–15)
BUN: 59 mg/dL — AB (ref 6–23)
CO2: 28 mmol/L (ref 19–32)
Calcium: 8.1 mg/dL — ABNORMAL LOW (ref 8.4–10.5)
Chloride: 97 mEq/L (ref 96–112)
Creatinine, Ser: 0.89 mg/dL (ref 0.50–1.35)
GFR calc Af Amer: >90 mL/min (ref >90)
GFR calc non Af Amer: >90 mL/min (ref >90)
GLUCOSE: 122 mg/dL — AB (ref 70–99)
PHOSPHORUS: 4.4 mg/dL (ref 2.3–4.6)
POTASSIUM: 4 mmol/L (ref 3.5–5.1)
Sodium: 132 mmol/L — ABNORMAL LOW (ref 135–145)

## 2014-03-16 LAB — HEPATIC FUNCTION PANEL
ALBUMIN: 2.4 g/dL — AB (ref 3.5–5.2)
ALK PHOS: 120 U/L — AB (ref 39–117)
ALT: 38 U/L (ref 0–53)
AST: 49 U/L — ABNORMAL HIGH (ref 0–37)
Bilirubin, Direct: <0.1 mg/dL (ref 0.0–0.5)
TOTAL PROTEIN: 7.6 g/dL (ref 6.0–8.3)
Total Bilirubin: 0.5 mg/dL (ref 0.3–1.2)

## 2014-03-16 LAB — TRIGLYCERIDES: TRIGLYCERIDES: 57 mg/dL (ref <150)

## 2014-03-16 LAB — MAGNESIUM: Magnesium: 1.9 mg/dL (ref 1.5–2.5)

## 2014-03-18 LAB — COMPREHENSIVE METABOLIC PANEL
ALT: 32 U/L (ref 0–53)
AST: 33 U/L (ref 0–37)
Albumin: 2.4 g/dL — ABNORMAL LOW (ref 3.5–5.2)
Alkaline Phosphatase: 111 U/L (ref 39–117)
Anion gap: 8 (ref 5–15)
BUN: 53 mg/dL — AB (ref 6–23)
CALCIUM: 7.8 mg/dL — AB (ref 8.4–10.5)
CO2: 27 mmol/L (ref 19–32)
Chloride: 95 mmol/L — ABNORMAL LOW (ref 96–112)
Creatinine, Ser: 0.84 mg/dL (ref 0.50–1.35)
GFR calc non Af Amer: >90 mL/min (ref >90)
Glucose, Bld: 158 mg/dL — ABNORMAL HIGH (ref 70–99)
Potassium: 3.7 mmol/L (ref 3.5–5.1)
Sodium: 130 mmol/L — ABNORMAL LOW (ref 135–145)
Total Bilirubin: 0.3 mg/dL (ref 0.3–1.2)
Total Protein: 7.4 g/dL (ref 6.0–8.3)

## 2014-03-19 LAB — CBC
HCT: 25.5 % — ABNORMAL LOW (ref 39.0–52.0)
Hemoglobin: 7.9 g/dL — ABNORMAL LOW (ref 13.0–17.0)
MCH: 26.2 pg (ref 26.0–34.0)
MCHC: 31 g/dL (ref 30.0–36.0)
MCV: 84.4 fL (ref 78.0–100.0)
Platelets: 291 10*3/uL (ref 150–400)
RBC: 3.02 MIL/uL — ABNORMAL LOW (ref 4.22–5.81)
RDW: 15.7 % — ABNORMAL HIGH (ref 11.5–15.5)
WBC: 7.3 10*3/uL (ref 4.0–10.5)

## 2014-03-19 LAB — MAGNESIUM: MAGNESIUM: 2 mg/dL (ref 1.5–2.5)

## 2014-03-20 LAB — BASIC METABOLIC PANEL
Anion gap: 7 (ref 5–15)
BUN: 47 mg/dL — ABNORMAL HIGH (ref 6–23)
CO2: 27 mmol/L (ref 19–32)
CREATININE: 0.67 mg/dL (ref 0.50–1.35)
Calcium: 8 mg/dL — ABNORMAL LOW (ref 8.4–10.5)
Chloride: 99 mmol/L (ref 96–112)
GLUCOSE: 138 mg/dL — AB (ref 70–99)
Potassium: 3.8 mmol/L (ref 3.5–5.1)
Sodium: 133 mmol/L — ABNORMAL LOW (ref 135–145)

## 2014-03-20 LAB — MAGNESIUM: Magnesium: 2 mg/dL (ref 1.5–2.5)

## 2014-03-21 LAB — IRON: IRON: 24 ug/dL — AB (ref 42–165)

## 2014-03-21 LAB — VITAMIN B12: VITAMIN B 12: 843 pg/mL (ref 211–911)

## 2014-03-21 LAB — FERRITIN: Ferritin: 478 ng/mL — ABNORMAL HIGH (ref 22–322)

## 2014-03-23 LAB — TRIGLYCERIDES: Triglycerides: 42 mg/dL (ref <150)

## 2014-03-23 LAB — RENAL FUNCTION PANEL
Albumin: 2.1 g/dL — ABNORMAL LOW (ref 3.5–5.2)
Anion gap: 5 (ref 5–15)
BUN: 40 mg/dL — ABNORMAL HIGH (ref 6–23)
CALCIUM: 7.6 mg/dL — AB (ref 8.4–10.5)
CO2: 26 mmol/L (ref 19–32)
CREATININE: 0.61 mg/dL (ref 0.50–1.35)
Chloride: 103 mmol/L (ref 96–112)
GFR calc Af Amer: >90 mL/min (ref >90)
GFR calc non Af Amer: >90 mL/min (ref >90)
Glucose, Bld: 75 mg/dL (ref 70–99)
POTASSIUM: 3.8 mmol/L (ref 3.5–5.1)
Phosphorus: 4.1 mg/dL (ref 2.3–4.6)
SODIUM: 134 mmol/L — AB (ref 135–145)

## 2014-03-23 LAB — HEPATIC FUNCTION PANEL
ALBUMIN: 2.1 g/dL — AB (ref 3.5–5.2)
ALK PHOS: 93 U/L (ref 39–117)
ALT: 22 U/L (ref 0–53)
AST: 23 U/L (ref 0–37)
BILIRUBIN TOTAL: 0.4 mg/dL (ref 0.3–1.2)
Bilirubin, Direct: <0.1 mg/dL (ref 0.0–0.5)
Total Protein: 6.4 g/dL (ref 6.0–8.3)

## 2014-03-23 LAB — MAGNESIUM: Magnesium: 1.8 mg/dL (ref 1.5–2.5)

## 2014-03-25 LAB — COMPREHENSIVE METABOLIC PANEL
ALT: 19 U/L (ref 0–53)
ANION GAP: 5 (ref 5–15)
AST: 25 U/L (ref 0–37)
Albumin: 2.2 g/dL — ABNORMAL LOW (ref 3.5–5.2)
Alkaline Phosphatase: 104 U/L (ref 39–117)
BILIRUBIN TOTAL: 0.5 mg/dL (ref 0.3–1.2)
BUN: 42 mg/dL — ABNORMAL HIGH (ref 6–23)
CHLORIDE: 102 mmol/L (ref 96–112)
CO2: 26 mmol/L (ref 19–32)
Calcium: 7.9 mg/dL — ABNORMAL LOW (ref 8.4–10.5)
Creatinine, Ser: 0.7 mg/dL (ref 0.50–1.35)
Glucose, Bld: 148 mg/dL — ABNORMAL HIGH (ref 70–99)
Potassium: 4.6 mmol/L (ref 3.5–5.1)
Sodium: 133 mmol/L — ABNORMAL LOW (ref 135–145)
TOTAL PROTEIN: 6.9 g/dL (ref 6.0–8.3)

## 2014-03-25 LAB — CBC
HCT: 26.6 % — ABNORMAL LOW (ref 39.0–52.0)
Hemoglobin: 8.2 g/dL — ABNORMAL LOW (ref 13.0–17.0)
MCH: 26.4 pg (ref 26.0–34.0)
MCHC: 30.8 g/dL (ref 30.0–36.0)
MCV: 85.5 fL (ref 78.0–100.0)
PLATELETS: 327 10*3/uL (ref 150–400)
RBC: 3.11 MIL/uL — ABNORMAL LOW (ref 4.22–5.81)
RDW: 16 % — ABNORMAL HIGH (ref 11.5–15.5)
WBC: 9.3 10*3/uL (ref 4.0–10.5)

## 2014-03-27 ENCOUNTER — Ambulatory Visit
Admit: 2014-03-27 | Disposition: A | Discharge status: Home / Self Care | Payer: Self-pay | Attending: Hospice and Palliative Medicine | Admitting: Hospice and Palliative Medicine

## 2014-03-27 LAB — HEPATIC FUNCTION PANEL
ALT: 21 U/L (ref 0–53)
AST: 26 U/L (ref 0–37)
Albumin: 2.3 g/dL — ABNORMAL LOW (ref 3.5–5.2)
Alkaline Phosphatase: 105 U/L (ref 39–117)
Bilirubin, Direct: <0.1 mg/dL (ref 0.0–0.5)
Total Bilirubin: 0.3 mg/dL (ref 0.3–1.2)
Total Protein: 7.4 g/dL (ref 6.0–8.3)

## 2014-03-27 LAB — RENAL FUNCTION PANEL
Albumin: 2.3 g/dL — ABNORMAL LOW (ref 3.5–5.2)
Anion gap: 7 (ref 5–15)
BUN: 49 mg/dL — ABNORMAL HIGH (ref 6–23)
CALCIUM: 7.9 mg/dL — AB (ref 8.4–10.5)
CO2: 27 mmol/L (ref 19–32)
CREATININE: 0.86 mg/dL (ref 0.50–1.35)
Chloride: 100 mmol/L (ref 96–112)
GFR calc Af Amer: >90 mL/min (ref >90)
GFR calc non Af Amer: >90 mL/min (ref >90)
Glucose, Bld: 133 mg/dL — ABNORMAL HIGH (ref 70–99)
POTASSIUM: 4.1 mmol/L (ref 3.5–5.1)
Phosphorus: 4.3 mg/dL (ref 2.3–4.6)
SODIUM: 134 mmol/L — AB (ref 135–145)

## 2014-03-27 LAB — TRIGLYCERIDES: TRIGLYCERIDES: 53 mg/dL (ref <150)

## 2014-03-27 LAB — MAGNESIUM: Magnesium: 2 mg/dL (ref 1.5–2.5)

## 2014-04-01 ENCOUNTER — Emergency Department: Payer: Self-pay | Admitting: Emergency Medicine

## 2014-04-01 LAB — CBC WITH DIFFERENTIAL/PLATELET
BASOS ABS: 0 10*3/uL (ref 0.0–0.1)
Basophil %: 0.5 %
EOS ABS: 0 10*3/uL (ref 0.0–0.7)
Eosinophil %: 0.1 %
HCT: 27.3 % — AB (ref 40.0–52.0)
HGB: 8.8 g/dL — ABNORMAL LOW (ref 13.0–18.0)
Lymphocyte #: 0.4 10*3/uL — ABNORMAL LOW (ref 1.0–3.6)
Lymphocyte %: 3.8 %
MCH: 26.3 pg (ref 26.0–34.0)
MCHC: 32.1 g/dL (ref 32.0–36.0)
MCV: 82 fL (ref 80–100)
MONOS PCT: 4.2 %
Monocyte #: 0.4 x10 3/mm (ref 0.2–1.0)
NEUTROS ABS: 8.5 10*3/uL — AB (ref 1.4–6.5)
Neutrophil %: 91.4 %
Platelet: 287 10*3/uL (ref 150–440)
RBC: 3.33 10*6/uL — ABNORMAL LOW (ref 4.40–5.90)
RDW: 16.9 % — ABNORMAL HIGH (ref 11.5–14.5)
WBC: 9.3 10*3/uL (ref 3.8–10.6)

## 2014-04-01 LAB — MAGNESIUM: MAGNESIUM: 1.6 mg/dL — AB

## 2014-04-01 LAB — URINALYSIS, COMPLETE
BACTERIA: NONE SEEN
Bilirubin,UR: NEGATIVE
Blood: NEGATIVE
GLUCOSE, UR: NEGATIVE mg/dL (ref 0–75)
KETONE: NEGATIVE
Leukocyte Esterase: NEGATIVE
Nitrite: NEGATIVE
Ph: 5 (ref 4.5–8.0)
Protein: NEGATIVE
RBC,UR: 1 /HPF (ref 0–5)
Specific Gravity: 1.006 (ref 1.003–1.030)
Squamous Epithelial: <1

## 2014-04-01 LAB — COMPREHENSIVE METABOLIC PANEL
ALK PHOS: 144 U/L — AB (ref 46–116)
ALT: 31 U/L (ref 14–63)
AST: 39 U/L — AB (ref 15–37)
Albumin: 2.2 g/dL — ABNORMAL LOW (ref 3.4–5.0)
Anion Gap: 7 (ref 7–16)
BUN: 39 mg/dL — AB (ref 7–18)
Bilirubin,Total: 0.4 mg/dL (ref 0.2–1.0)
CHLORIDE: 99 mmol/L (ref 98–107)
Calcium, Total: 7.5 mg/dL — ABNORMAL LOW (ref 8.5–10.1)
Co2: 26 mmol/L (ref 21–32)
Creatinine: 0.88 mg/dL (ref 0.60–1.30)
EGFR (African American): >60
Glucose: 120 mg/dL — ABNORMAL HIGH (ref 65–99)
Osmolality: 275 (ref 275–301)
Potassium: 3.7 mmol/L (ref 3.5–5.1)
Sodium: 132 mmol/L — ABNORMAL LOW (ref 136–145)
TOTAL PROTEIN: 7.4 g/dL (ref 6.4–8.2)

## 2014-04-01 LAB — PROTIME-INR
INR: 1.2
PROTHROMBIN TIME: 15.4 s — AB

## 2014-04-01 LAB — PHOSPHORUS: Phosphorus: 3.5 mg/dL (ref 2.5–4.9)

## 2014-04-01 LAB — TROPONIN I: Troponin-I: 0.03 ng/mL

## 2014-04-02 ENCOUNTER — Emergency Department: Payer: Self-pay | Admitting: Internal Medicine

## 2014-04-02 LAB — COMPREHENSIVE METABOLIC PANEL
AST: 32 U/L (ref 15–37)
Albumin: 2.1 g/dL — ABNORMAL LOW (ref 3.4–5.0)
Alkaline Phosphatase: 130 U/L — ABNORMAL HIGH (ref 46–116)
Anion Gap: 7 (ref 7–16)
BUN: 35 mg/dL — ABNORMAL HIGH (ref 7–18)
Bilirubin,Total: 0.3 mg/dL (ref 0.2–1.0)
CO2: 27 mmol/L (ref 21–32)
Calcium, Total: 7.6 mg/dL — ABNORMAL LOW (ref 8.5–10.1)
Chloride: 100 mmol/L (ref 98–107)
Creatinine: 0.95 mg/dL (ref 0.60–1.30)
GLUCOSE: 119 mg/dL — AB (ref 65–99)
OSMOLALITY: 277 (ref 275–301)
Potassium: 3.8 mmol/L (ref 3.5–5.1)
SGPT (ALT): 27 U/L (ref 14–63)
Sodium: 134 mmol/L — ABNORMAL LOW (ref 136–145)
Total Protein: 7 g/dL (ref 6.4–8.2)

## 2014-04-02 LAB — CBC WITH DIFFERENTIAL/PLATELET
Basophil #: 0 10*3/uL (ref 0.0–0.1)
Basophil %: 0.5 %
EOS PCT: 1.4 %
Eosinophil #: 0.1 10*3/uL (ref 0.0–0.7)
HCT: 27.9 % — ABNORMAL LOW (ref 40.0–52.0)
HGB: 8.9 g/dL — ABNORMAL LOW (ref 13.0–18.0)
LYMPHS PCT: 13.4 %
Lymphocyte #: 0.7 10*3/uL — ABNORMAL LOW (ref 1.0–3.6)
MCH: 26 pg (ref 26.0–34.0)
MCHC: 31.9 g/dL — ABNORMAL LOW (ref 32.0–36.0)
MCV: 81 fL (ref 80–100)
MONOS PCT: 10.2 %
Monocyte #: 0.5 x10 3/mm (ref 0.2–1.0)
NEUTROS PCT: 74.5 %
Neutrophil #: 4 10*3/uL (ref 1.4–6.5)
Platelet: 267 10*3/uL (ref 150–440)
RBC: 3.43 10*6/uL — ABNORMAL LOW (ref 4.40–5.90)
RDW: 17 % — AB (ref 11.5–14.5)
WBC: 5.4 10*3/uL (ref 3.8–10.6)

## 2014-04-02 LAB — PROTIME-INR
INR: 1.2
Prothrombin Time: 15.7 secs — ABNORMAL HIGH

## 2014-04-02 LAB — CULTURE, BLOOD (SINGLE)

## 2014-04-03 ENCOUNTER — Inpatient Hospital Stay: Payer: Self-pay | Admitting: Internal Medicine

## 2014-04-03 LAB — COMPREHENSIVE METABOLIC PANEL
ALK PHOS: 135 U/L — AB (ref 46–116)
Albumin: 2 g/dL — ABNORMAL LOW (ref 3.4–5.0)
Anion Gap: 8 (ref 7–16)
BUN: 39 mg/dL — ABNORMAL HIGH (ref 7–18)
Bilirubin,Total: 0.3 mg/dL (ref 0.2–1.0)
CO2: 27 mmol/L (ref 21–32)
CREATININE: 0.96 mg/dL (ref 0.60–1.30)
Calcium, Total: 7.7 mg/dL — ABNORMAL LOW (ref 8.5–10.1)
Chloride: 101 mmol/L (ref 98–107)
Glucose: 112 mg/dL — ABNORMAL HIGH (ref 65–99)
Osmolality: 282 (ref 275–301)
Potassium: 3.7 mmol/L (ref 3.5–5.1)
SGOT(AST): 38 U/L — ABNORMAL HIGH (ref 15–37)
SGPT (ALT): 30 U/L (ref 14–63)
SODIUM: 136 mmol/L (ref 136–145)
TOTAL PROTEIN: 7 g/dL (ref 6.4–8.2)

## 2014-04-03 LAB — CBC WITH DIFFERENTIAL/PLATELET
BASOS ABS: 0 10*3/uL (ref 0.0–0.1)
Basophil %: 0.5 %
EOS ABS: 0.1 10*3/uL (ref 0.0–0.7)
Eosinophil %: 2.1 %
HCT: 26.2 % — ABNORMAL LOW (ref 40.0–52.0)
HGB: 8.2 g/dL — ABNORMAL LOW (ref 13.0–18.0)
Lymphocyte #: 0.9 10*3/uL — ABNORMAL LOW (ref 1.0–3.6)
Lymphocyte %: 14.7 %
MCH: 25.7 pg — ABNORMAL LOW (ref 26.0–34.0)
MCHC: 31.4 g/dL — ABNORMAL LOW (ref 32.0–36.0)
MCV: 82 fL (ref 80–100)
MONOS PCT: 12.8 %
Monocyte #: 0.8 x10 3/mm (ref 0.2–1.0)
Neutrophil #: 4.3 10*3/uL (ref 1.4–6.5)
Neutrophil %: 69.9 %
PLATELETS: 271 10*3/uL (ref 150–440)
RBC: 3.2 10*6/uL — ABNORMAL LOW (ref 4.40–5.90)
RDW: 17.1 % — AB (ref 11.5–14.5)
WBC: 6.1 10*3/uL (ref 3.8–10.6)

## 2014-04-03 LAB — TROPONIN I: Troponin-I: 0.03 ng/mL

## 2014-04-03 LAB — CULTURE, BLOOD (SINGLE)

## 2014-04-03 LAB — MAGNESIUM: MAGNESIUM: 1.7 mg/dL — AB

## 2014-04-04 DIAGNOSIS — I38 Endocarditis, valve unspecified: Secondary | ICD-10-CM

## 2014-04-17 ENCOUNTER — Inpatient Hospital Stay: Payer: Self-pay | Admitting: Internal Medicine

## 2014-05-26 ENCOUNTER — Ambulatory Visit
Admit: 2014-05-26 | Disposition: A | Discharge status: Home / Self Care | Payer: Self-pay | Attending: Hospice and Palliative Medicine | Admitting: Hospice and Palliative Medicine

## 2014-06-17 NOTE — Op Note (Signed)
PATIENT NAME:  [name], [name] MR#:  [id number] DATE OF BIRTH:  [birth year]  DATE OF PROCEDURE:  11/28/2013  PREOPERATIVE DIAGNOSIS:  Sepsis.   POSTOPERATIVE DIAGNOSIS:  Sepsis.   PROCEDURE:  Right subclavian vein central line placement with ultrasound guidance.   SURGEON:  Richard E. Excell Seltzerooper, MD   ANESTHESIA:  Local anesthetic.   INDICATIONS:  This is a patient who is in septic shock requiring pressors and volume resuscitation and central line placement. Preoperatively, I discussed the rationale for the placement and the risks associated with the procedure and consent had been obtained by the nursing staff.   FINDINGS:  Right subclavian vein approach was utilized due to the patient's severe kyphoscoliosis and inability to access the neck easily in the left subclavian area. Also, he had had multiple surgeries for weight reduction apparently and had scars throughout his abdomen and groin, and with ultrasound guidance, I could not identify any identifiable arterial or venous flow or palpable pulse in the groins. Therefore, with ultrasound guidance, the right subclavian vein was visualized.   DESCRIPTION OF PROCEDURE:  The patient was properly identified in the intensive care unit. Attempts at choosing a site utilizing palpation and ultrasound and inspection was performed as above. Ultimately, the right subclavian approach was chosen with ultrasound guidance.  Using cap, gown, and gloves, the patient was prepped and draped in a sterile fashion. Local anesthetic was infiltrated in the skin and subcutaneous tissues in the anterior chest on the right side, and on the first attempt, a large-bore needle was placed into the right subclavian vein. Seldinger wire was placed. An incision was made, and the introducer dilator was placed followed by placement of the previously flushed triple-lumen catheter. It was aspirated and flushed for function and then sutured to the skin of the anterior chest wall, and a sterile  dressing was placed.   The patient tolerated the procedure well. There were no complications. He was left in critical condition in the intensive care unit with a chest film ordered and pending.    ____________________________ Adah Salvageichard E. Excell Seltzerooper, MD rec:nb D: 11/28/2013 02:14:35 ET T: 11/28/2013 03:01:14 ET JOB#: [id number]  cc: Adah Salvageichard E. Excell Seltzerooper, MD, <Dictator> Lattie HawICHARD E COOPER MD ELECTRONICALLY SIGNED 11/30/2013 19:25

## 2014-06-17 NOTE — Consult Note (Signed)
Chief Complaint:  Subjective/Chief Complaint patient seen for protien calorie malnutrition due to previous bariatric surgery.  currently on tf.  variable mentation, occasionally appropriately responsive to questions then appears to respond inappropriately. denies abdominal pain and nausea.   VITAL SIGNS/ANCILLARY NOTES: **Vital Signs.:   15-Oct-15 12:00  Temperature Temperature (F) 97.8  Celsius 36.5  Temperature Source axillary  Pulse Pulse 43  Respirations Respirations 14  Systolic BP Systolic BP 92  Diastolic BP (mmHg) Diastolic BP (mmHg) 51  Mean BP 64  Pulse Ox % Pulse Ox % 95  Pulse Ox Activity Level  At rest  Oxygen Delivery Room Air/ 21 %   Brief Assessment:  Cardiac Regular   Respiratory clear BS   Gastrointestinal details normal bs positive, obese/anasarca, nontender   Lab Results: Hepatic:  12-Oct-15 11:08   Albumin, Serum  1.0  Bilirubin, Total 0.2  Alkaline Phosphatase  118 (46-116 NOTE: New Reference Range 09/13/13)  SGPT (ALT) 25 (14-63 NOTE: New Reference Range 09/13/13)  SGOT (AST) 22  Total Protein, Serum  4.6  15-Oct-15 04:34   Albumin, Serum  1.0 (Result(s) reported on 08 Dec 2013 at 06:55AM.)  Routine Chem:  15-Oct-15 04:38   Magnesium, Serum  1.6 (1.8-2.4 THERAPEUTIC RANGE: 4-7 mg/dL TOXIC: > 10 mg/dL  -----------------------)  Phosphorus, Serum  1.9 (Result(s) reported on 08 Dec 2013 at 05:04AM.)  Glucose, Serum  135  BUN 11  Creatinine (comp) 0.68  Sodium, Serum 141  Potassium, Serum 4.2  Chloride, Serum  113  CO2, Serum 24  Calcium (Total), Serum  6.3  Anion Gap  4  Osmolality (calc) 283  eGFR (African American) >60  eGFR (Non-African American) >60 (eGFR values <60m/min/1.73 m2 may be an indication of chronic kidney disease (CKD). Calculated eGFR, using the MRDR Study equation, is useful in  patients with stable renal function. The eGFR calculation will not be reliable in acutely ill patients when serum creatinine is changing  rapidly. It is not useful in patients on dialysis. The eGFR calculation may not be applicable to patients at the low and high extremes of body sizes, pregnant women, and vetetarians.)  Result Comment CALCIUM - NOTIFIED OF CRITICAL VALUE  - RESULTS VERIFIED BY REPEAT TESTING.  - CALLED TO SStarrAT 0510 ON  - 12/08/13..Marland KitchenMarland KitchenPL  - READ-BACK PROCESS PERFORMED.  Result(s) reported on 08 Dec 2013 at 05:04AM.  Result Comment PT - VERIFIED IN ERROR  Result(s) reported on 08 Dec 2013 at 06:50AM.  Routine Coag:  15-Oct-15 04:38   Prothrombin -  INR - (INR reference interval applies to patients on anticoagulant therapy. A single INR therapeutic range for coumarins is not optimal for all indications; however, the suggested range for most indications is 2.0 - 3.0. Exceptions to the INR Reference Range may include: Prosthetic heart valves, acute myocardial infarction, prevention of myocardial infarction, and combinations of aspirin and anticoagulant. The need for a higher or lower target INR must be assessed individually. Reference: The Pharmacology and Management of the Vitamin K  antagonists: the seventh ACCP Conference on Antithrombotic and Thrombolytic Therapy. CZCHYI.5027Sept:126 (3suppl): [id number] A HCT value >55% may artifactually increase the PT.  In one study,  the increase was an average of 25%. Reference:  "Effect on Routine and Special Coagulation Testing Values of Citrate Anticoagulant Adjustment in Patients with High HCT Values." American Journal of Clinical Pathology 2006;126:400-405.)    06:38   Prothrombin  21.9  INR 2.0 (INR reference interval applies to patients on anticoagulant therapy. A single  INR therapeutic range for coumarins is not optimal for all indications; however, the suggested range for most indications is 2.0 - 3.0. Exceptions to the INR Reference Range may include: Prosthetic heart valves, acute myocardial infarction, prevention of  myocardial infarction, and combinations of aspirin and anticoagulant. The need for a higher or lower target INR must be assessed individually. Reference: The Pharmacology and Management of the Vitamin K  antagonists: the seventh ACCP Conference on Antithrombotic and Thrombolytic Therapy. QDIYM.4158 Sept:126 (3suppl): [id number]. A HCT value >55% may artifactually increase the PT.  In one study,  the increase was an average of 25%. Reference:  "Effect on Routine and Special Coagulation Testing Values of Citrate Anticoagulant Adjustment in Patients with High HCT Values." American Journal of Clinical Pathology 2006;126:400-405.)   Assessment/Plan:  Assessment/Plan:  Assessment 1) protien calorie malnutrition-severe.  patient now with dobhoff tube for tf. Patient is on elemental feeds per dietary.  I have told nursing not to use the dobhoff for crushed meds, as the small diameter and length will make it quite likely to block.  2) h/o extensive bariatric surgery.  Likely not a good candidate for percutaneous endoscopic gastroscopy tube as it is a near blind stick in placing the tube.  Patient would likely do better with a surgically placed duodenal tube. The dobhoff is a bridge until consideration of the possibility of surgically placed tube.   3) sepsis/nonhealing ulcers-for proceedure tomorrow.   Plan as above.  I will try to contact bariatric service for opinion of surgical duodenal tube.  will sign off.   Electronic Signatures: Loistine Simas (MD)  (Signed 15-Oct-15 18:46)  Authored: Chief Complaint, VITAL SIGNS/ANCILLARY NOTES, Brief Assessment, Lab Results, Assessment/Plan   Last Updated: 15-Oct-15 18:46 by Loistine Simas (MD)

## 2014-06-17 NOTE — Consult Note (Signed)
   Comments   I met with pt's 2 sisters who just arrived [place]. Updated them on pt's current condition. They both understand that pt is critically ill and has a very poor prognosis. I told them of my conversation with pt this morning when he told me he did not want resuscitation in the event of cardiopulmonary arrest. Sisters agree that this is most appropriate. Code status changed to DNR.   Electronic Signatures: Gevin Perea, Izora Gala (MD)  (Signed 09-Oct-15 17:18)  Authored: Palliative Care   Last Updated: 09-Oct-15 17:18 by Auron Tadros, Izora Gala (MD)

## 2014-06-17 NOTE — Consult Note (Signed)
Chief Complaint:  Subjective/Chief Complaint patietn drowsy/lethargic.  will respond to questions.  denies abdominal pain or nausea.   VITAL SIGNS/ANCILLARY NOTES: **Vital Signs.:   14-Oct-15 07:10  Vital Signs Type Routine  Temperature Temperature (F) 98  Celsius 36.6  Temperature Source oral  Pulse Pulse 64  Respirations Respirations 20  Systolic BP Systolic BP 81  Diastolic BP (mmHg) Diastolic BP (mmHg) 63  Mean BP 69  BP Source  if not from Vital Sign Device non-invasive  Pulse Ox % Pulse Ox % 95  Pulse Ox Activity Level  At rest  Oxygen Delivery Room Air/ 21 %    14:00  Vital Signs Type Routine  Temperature Source oral  Pulse Pulse 48  Pulse source if not from Vital Sign Device per cardiac monitor  Respirations Respirations 18  Systolic BP Systolic BP 92  Diastolic BP (mmHg) Diastolic BP (mmHg) 61  Mean BP 71  Pulse Ox % Pulse Ox % 95  Oxygen Delivery Room Air/ 21 %   Brief Assessment:  GEN critically ill appearing   Cardiac Regular   Respiratory occasional coarse rhonchi   Gastrointestinal details normal Nontender  mild distension/obesity/anasarca, bowel sounds positive   Lab Results: Hepatic:  12-Oct-15 11:08   Bilirubin, Total 0.2  Alkaline Phosphatase  118 (46-116 NOTE: New Reference Range 09/13/13)  SGPT (ALT) 25 (14-63 NOTE: New Reference Range 09/13/13)  SGOT (AST) 22  Total Protein, Serum  4.6  Albumin, Serum  1.0  Routine Chem:  12-Oct-15 11:08   Glucose, Serum  103  BUN 14  Creatinine (comp) 0.74  Sodium, Serum 142  Potassium, Serum 4.2  Chloride, Serum  119  CO2, Serum 23  Calcium (Total), Serum  6.4  Osmolality (calc) 284  eGFR (African American) >60  eGFR (Non-African American) >60 (eGFR values <62m/min/1.73 m2 may be an indication of chronic kidney disease (CKD). Calculated eGFR, using the MRDR Study equation, is useful in  patients with stable renal function. The eGFR calculation will not be reliable in acutely ill  patients when serum creatinine is changing rapidly. It is not useful in patients on dialysis. The eGFR calculation may not be applicable to patients at the low and high extremes of body sizes, pregnant women, and vetetarians.)  Result Comment Calcium - RESULTS VERIFIED BY REPEAT TESTING.  - Critical value called to and read back  - by SLeonel Ramsayin CCU @ 1140 12/05/13.BGB  Result(s) reported on 05 Dec 2013 at 11:40AM.  Anion Gap  0  Routine Coag:  11-Oct-15 04:30   INR 2.0 (INR reference interval applies to patients on anticoagulant therapy. A single INR therapeutic range for coumarins is not optimal for all indications; however, the suggested range for most indications is 2.0 - 3.0. Exceptions to the INR Reference Range may include: Prosthetic heart valves, acute myocardial infarction, prevention of myocardial infarction, and combinations of aspirin and anticoagulant. The need for a higher or lower target INR must be assessed individually. Reference: The Pharmacology and Management of the Vitamin K  antagonists: the seventh ACCP Conference on Antithrombotic and Thrombolytic Therapy. CKCLEX.5170Sept:126 (3suppl): [id number] A HCT value >55% may artifactually increase the PT.  In one study,  the increase was an average of 25%. Reference:  "Effect on Routine and Special Coagulation Testing Values of Citrate Anticoagulant Adjustment in Patients with High HCT Values." American Journal of Clinical Pathology 2006;126:400-405.)   Radiology Results: [place]    12-Oct-15 16:09, [place]  [place]  REASON FOR EXAM:    elevated INR, abnormal LFT's.  COMMENTS:   Body Site: Gallbladder, Liver, Common Bile Duct    PROCEDURE: [place]  - US ABDOMEN LIMITED SURVEY  - Dec 05 2013  4:09PM     CLINICAL DATA:  Abnormal LFTs.    EXAM:  US ABDOMEN LIMITED - RIGHT UPPER QUADRANT    COMPARISON:  None.    FINDINGS:  Gallbladder:  Cholecystectomy.    Common bile  duct:    Diameter: 4.7 mm.    Liver:    Limited evaluation due to patient body habitus and scarring over the  upper abdomen. Mild ascites. Portal vein appears to be patent.     IMPRESSION:  1. Cholecystectomy.  No biliary distention.  2. Limited exam due to patient body habitus scarring of the upper  abdomen. Mild ascites.  Electronically Signed    By: Marcello Moores  Register    On: 12/05/2013 16:18         Verified By: Osa Craver, M.D., MD   Assessment/Plan:  Assessment/Plan:  Assessment 1) protien calorie malnutrition in the setting of osteomyelitis, previous bariatric surgery.  lethargic, markedly hypoalbuminemic. 2) anasarca/hypoalbuminemia.   Plan 1) in light of the post operative anatomy from the previous bariatric anatomy, a PEG may not be the best choice for nutrition currently.  recommend placement of a dobhoff as a bridge to allow nutrition, and surgical consultation,  for possible gastrostomy placement surgically for longer term care.  I have discussed the case with Dr Nevada Crane in  radiology today and they are going to evaluate and place the dobhoff if clinically feasible.  following.   Electronic Signatures: Loistine Simas (MD)  (Signed 14-Oct-15 15:35)  Authored: Chief Complaint, VITAL SIGNS/ANCILLARY NOTES, Brief Assessment, Lab Results, Radiology Results, Assessment/Plan   Last Updated: 14-Oct-15 15:35 by Loistine Simas (MD)

## 2014-06-17 NOTE — Discharge Summary (Signed)
PATIENT NAME:  [name], [name] MR#:  [id number] DATE OF BIRTH:  December 29, 1960  DATE OF ADMISSION:  11/27/2013 DATE OF DISCHARGE:  12/13/2013  For further details, please see interim discharge summary done on 12/03/2013 by Dr. Delfino Lovett.  This discharge summary covers hospital course after October 10.   FINAL DIAGNOSES: 1.  Sepsis due to ESBL urinary tract infection and chronic nonhealing ulcers on the left lower extremity.  2.  Hypotension due to combination of low  osmotic pressure secondary to hypoalbuminemia and sepsis and infection.  3.  Acute renal failure suspected from acute tubular necrosis and sepsis, resolved.   4.  Chronic nonhealing leg ulcers.   5.  Severe malnutrition and hypoalbuminemia.  6.  Chronic pain and depression.  7.  Coagulopathy.  8.  Hypothyroidism.   HOSPITAL COURSE: This patient came with sepsis and had infected ulcers. Initially had blood culture positive and later on repeat blood culture reported to be negative during earlier course of hospital stay later on during my time period of seeing patient. He was being treated by ertapenem and ciprofloxacin for his wound infection and his ESBL UTI. Initially he was on Levophed IV drip to maintain his blood pressure, but gradually was able to come off and for the last 48 to 72 hours he is off Levophed and maintaining blood pressure in lower normal range without any support. Infectious disease consult has been called in to help with adjusting his medications and they agreed with the current plan, and suggesting to change his antibiotic or stop after he had amputation done. Because of poor circulation and chronic infected ulcer on the left lower extremity, vascular is suggesting to go for above-the-knee amputation. Gradually now as patient had some improvement in his blood pressure, he is suggesting to speak to [facility] and try to speak to his vascular surgeon, as he had an amputation done by his vascular surgeon at [facility] in the past  on the right leg, and so will proceed with that, and are in the process of getting patient transferred to [facility]. Other than his leg ulcer, he also had issues of severe and chronic malnutrition and had history of gastric bypass surgery, so a complicated anatomy, and has dysphagia. So a Dobbhoff tube has been placed by a radiologist's help and started on tube feeding to improve his nutritional status before he can be taken for amputation surgery over here. Overall, he might need a gastric tube placement which we do not have the capacity to do due to lack of gastric surgery, and [facility] might take care of that also when we will transfer over there. Other medical issues like hypothyroidism, chronic pain and depression, gastroesophageal reflux disease, and coagulopathy remain stable. No interventions were needed. Renal function improved and became normal.  CONSULTATIONS DURING THIS TIME PERIOD: Vascular consultation Dr. Wyn Quaker and infectious disease Dr. Clydie Braun.   IMPORTANT LABORATORY RESULTS: Creatinine 0.73, phosphorus 1.9, magnesium 1.7, calcium is 6.3. HIV test is done which is negative.   DISCHARGE MEDICATIONS: 1.  Metolazone 15 mg oral  every 12 hours.  2.  Cholestyramine 4 grams oral every 12 hours.  3.  Docusate 10 mg/mL oral liquid 5 mL once a day.  4.  Levothyroxine 37.5 mcg injection once a day.  5.  Multivitamin 5 mL once a day.  6.  Acetaminophen 325 mg oral tablet 2 tablets every 4 hours as needed for mild pain and temperature greater than 100.4.  7.  Morphine 2  mg injectable every 2 hours as needed for pain.  8.  Calcium carbonate oral suspension 2 mL oral 2 times a day.  9.  Albuterol and ipratropium inhalation 4 times a day.  10.   Ertapenem 1 gram IV daily.  11.   Ciprofloxacin 400 mg IV every 8 hours.  12.   Cyanocobalamin 500 mcg intramuscular once a day.   TOTAL TIME SPENT ON THIS DISCHARGE SUMMARY: 30 minutes.   Please see interim discharge summary done by  Dr. Sherryll BurgerShah on October 10 and attach a copy with this.     ____________________________ Hope PigeonVaibhavkumar G. Elisabeth PigeonVachhani, MD vgv:at D: 12/13/2013 15:47:15 ET T: 12/13/2013 16:26:10 ET JOB#: [id number]  cc: Hope PigeonVaibhavkumar G. Elisabeth PigeonVachhani, MD, <Dictator> Altamese DillingVAIBHAVKUMAR Nysia Dell MD ELECTRONICALLY SIGNED 12/25/2013 18:07

## 2014-06-17 NOTE — Consult Note (Signed)
PATIENT NAME:  [name], Desa MR#:  [id number] DATE OF BIRTH:  1960-03-15  DATE OF CONSULTATION:  12/06/2013  REFERRING PHYSICIAN:  Vipul S. Sherryll BurgerShah, MD CONSULTING PHYSICIAN:  Keturah Barrehristiane H. Bristyl Mclees, NP  REASON FOR CONSULTATION: GI consult ordered by Dr. Sherryll BurgerShah for evaluation of possible PEG tube placement.   HISTORY OF PRESENT ILLNESS: I appreciate the consult for this 54 year old, Caucasian man with complicated health history, admitted with sepsis related to chronic ulcers, for evaluation of PEG tube placement due to protein calorie malnutrition. The patient has history of extensive gastric bypass surgery, believes it was a duodenal switch with some type of biliary diversion. [place] was [place] in [place] about 10 years ago. Since then, he has had trouble with malabsorption. He is currently on a dysphagia diet but says he has not really had any problems with obstruction to swallowing. His dysphagia diet includes thickened liquids. He has not had much p.o. intake over the last week. States he is not having any abdominal pain, reflux issues, problems with bowel habits nor any other GI complaints. He is currently alert although mildly confused at times. A family friend is at bedside. I do note that he has some mild coagulopathy, although not on anticoagulants and denies history of liver disease. Left AKA is being considered for nonhealing of ulcer/PVD. 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 Was also admitted with sepsis and on antibiotics, low albumin of 1, and generalized anasarca. ALP somewhat elevated. This may be due to his physiologic changes due to surgery in the small bowel. Do note that palliative care is following him as well.   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   HOME MEDICATIONS:  Colace 100 mg p.o. b.i.d., iron 325 mg p.o. daily, Synthroid 175 mcg p.o. daily, multivitamin 1 daily, Aleve 220 mg p.o. daily, spironolactone 25 mg p.o. daily, Protonix 40 mg p.o. b.i.d., citalopram 10 mg p.o. daily, Lasix 10 mg p.o. b.i.d., methadone 15 mg p.o. b.i.d., Dilaudid 2 mg q. 4 h. p.r.n.   ALLERGIES: NKDA.   SOCIAL HISTORY: Lives alone. No alcohol or tobacco. No illicits. Friend states concern over ability to self-care at times.   FAMILY HISTORY: Unable to obtain.   REVIEW OF SYSTEMS: Difficult to obtain due to patient's intermittent confusion at this time.   LABORATORY DATA: Most recent: Glucose 103, BUN 14, creatinine 0.74, sodium 142, potassium 4.2, chloride 119, GFR 60, calcium 6.4, total protein 4.6, albumin 1.0, total bilirubin 0.2, ALP 118, AST 22, ALT 15. WBC 15.3, hemoglobin 9, hematocrit 29.3, platelet count 261,000. PT 21.8, INR 2.0.  Ultrasound a couple of days ago showed cholecystectomy, normal common bile duct, no biliary distention. Limited exam due to body habitus and scarring over the upper abdomen.   PHYSICAL EXAMINATION:  VITAL SIGNS: Most recent: Temperature 97.6, pulse 51, respiratory rate 18, blood pressure 81/53, oxygen saturation 97% on room air. Of note, he has been requiring pressor therapy to maintain adequate MAP.  GENERAL:  Chronically ill-appearing man in no acute distress, resting comfortably in bed.  HEENT: Normocephalic, atraumatic. There is a bandage underneath his chin that is clean, dry and intact. His conjunctivae are pale. Sclerae are clear. Mucous membranes are pink and moist.  NECK: Supple. No JVD, adenopathy, thyromegaly.  CHEST: There is a right subclavian catheter intact. Respirations eupneic. Lungs are clear, somewhat decreased in the  bases.  CARDIAC: S1, S2, RRR, somewhat bradycardic. No MRG; 2+ generalized edema.  ABDOMEN: Several well-healing scars. Bowel sounds x4. Soft, nontender, nondistended. Unable to appreciate any  hepatosplenomegaly or masses. No guarding, rigidity or peritoneal signs.  EXTREMITIES: Generalized weakness. Sensation appears to be intact. Well-healed incisions to right amputation site. There is a dressing to the left leg that is clean, dry and intact.  SKIN: Warm, dry, pale, somewhat pink; a few ecchymotic lesions. No rash appreciated, as above.  NEUROLOGIC: Alert, oriented to self and place, somewhat tangential. Cranial nerves appear to be intact. Speech clear. No facial droop.  PSYCHIATRIC: Pleasant, calm, cooperative, tangential. Requires some redirection. Gets confused sometimes about details.   IMPRESSION AND PLAN: Protein calorie malnutrition, chronic. Would recommend initiation of TPN as bridge while in consideration of PEG, if necessary, as well as nutritionist recommendations. Would also consult bariatric surgery for evaluation of this, as his extensive GI surgery may preclude him from being a candidate for tube placement. His coagulopathy of uncertain origin, female, will also influence his candidacy for tube placement. Can also consider NG feedings to supplement his nutrition.   Thank you very much for this consult.   These services were provided by Vevelyn Pat, MSN, [facility], under collaborative agreement with Barnetta Chapel, MD,  with whom I have discussed this patient in full.    ____________________________ Keturah Barre, NP chl:je D: 12/07/2013 10:02:00 ET T: 12/07/2013 10:45:12 ET JOB#: [id number]  cc: Keturah Barre, NP, <Dictator> Eustaquio Maize Janeli Lewison FNP ELECTRONICALLY SIGNED 12/09/2013 16:56

## 2014-06-17 NOTE — Consult Note (Signed)
PATIENT NAME:  [name], KUKUK MR#:  [id number] DATE OF BIRTH:  Dec 31, 1960  DATE OF CONSULTATION:  12/12/2013  CONSULTING PHYSICIAN:  Cheral Marker. Ola Spurr, MD  REASON FOR CONSULTATION: Lower extremity infection and sepsis.  HISTORY OF PRESENT ILLNESS: This is quite a complicated 54 year old gentleman admitted to the intensive care unit October 4 from this facility with altered mental status and hypotension.  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 He has become profoundly protein malnourished with an albumin of 1 or less. He recently had a right AKA in [place] for chronic infection of his leg there. Those records are not available. He has been dealing also with chronic left lower extremity ulcer. Apparently, his vascular supply is intact per review of old notes.  However, I do not think he had marked edema and anasarca and it is presumed these wounds developed at that time and now involve bone, skin, and soft tissue.   Since he was admitted, he has been hypotensive. He had an elevated white count. He has been in the intensive care unit on pressors. He has been seen by vascular surgery, GI, palliative care. The plan is for likely amputation of the left lower extremity due to the chronic nonhealing ulceration and the severe protein malnutrition. However, he has been intermittently hypotensive and is unable to be stable enough for the surgery.  He has also been bacteremic. We are consulted for further antibiotic assistance.   PAST MEDICAL HISTORY: 1.  Prior morbid obesity.  2.  Chronic ulceration of the lower extremities, status post right AKA.  3.  Prior osteomyelitis.  4.  Peripheral vascular disease.  5.  Depression.  6.  Chronic pain.  7.  Hypothyroidism.  8.  Anemia.   PAST SURGICAL HISTORY:  1.  Right AKA in May 2015.  2.  Extensive bariatric surgery, unclear details, done in [place] approximately 10 years ago.     SOCIAL HISTORY: The patient states he is originally from [place]. He works in Psychiatric nurse. His family is involved in his care including sisters, but he has no children.   REVIEW OF SYSTEMS: Unable to be obtained.   ALLERGIES: No known drug allergies.   ANTIBIOTICS SINCE ADMISSION: Initially, the patient was on vancomycin and Zosyn. However, based on culture data, the patient has been on ciprofloxacin and ertapenem.   FAMILY HISTORY: Noncontributory.   PHYSICAL EXAMINATION: VITAL SIGNS: Temperature 97.6, pulse 60, blood pressure 73/60, respirations 14, saturating at 96% on room air. GENERAL:  The patient is lying in bed. He is quite chronically ill-appearing. He is somewhat confused, but is able to answer yes or no questions. He is quite pale.  HEENT:  His pupils are reactive. Extraocular movements are intact. He has a Dobbhoff tube in place.  NECK: Supple.  HEART: Tachycardic, but regular.  ABDOMEN: Distended, multiple scars. EXTREMITIES:  He has a right AKA site that is relatively well healed except for one small area of dehiscence. The left lower extremity has extensive deep ulceration with exposed soft tissue, muscle, and  bone.    LABORATORY DATA: Microbiology, blood cultures from October 4 grew 2 out of 2 Myroides species sensitive to Cipro and levofloxacin, but resistant to imipenem, ceftazidime, gentamicin, and ceftriaxone. Ulcer culture October 4 grew Serratia, Pseudomonas and group B strep. The Serratia was resistant to Cipro, but sensitive to ertapenem. The Pseudomonas was sensitive to Cipro as well as imipenem.  Urine culture October  4 grew 2 forms of Escherichia, 80,000 colonies each.  Repeat blood cultures October 8 grew no growth.  Wound culture October 9 grew heavy growth of Klebsiella and heavy growth Proteus vulgaris and Klebsiella. Was an ESBL producer and was sensitive to ertapenem, but intermediate to ciprofloxacin. The Proteus was sensitive to  ciprofloxacin and ertapenem.  White blood count on admission was 20.3, currently it is 11.3, hemoglobin 8.7, platelets 288,000. Renal function shows a creatinine of 0.89, albumin is severely low at 0.8. TSH is 2.08.  ESR is 28. Vitamin B12 was greater than 1900.   IMPRESSION: A 54 year old with chronic protein malnutrition, status post bariatric surgery 10 years ago with prior massive edema, chronic bilateral lower extremity ulceration and status post right above-knee amputation earlier this year. Presents with sepsis with Myroides as well as wound infection with Klebsiella, Proteus, Pseudomonas and Serratia and group B streptococcus. He has been adequately treated with antibiotics on ciprofloxacin and ertapenem, which covered all these organisms. However, he remains hypotensive. He will require an above-knee amputation to help heal these wounds.   RECOMMENDATIONS: 1.  Continue current antibiotics.  2.  Check an HIV test as well as a cortisol level.  3.  We will attempt to review further records from outside hospital.  We thank you for the consult.  We would be glad to follow with you.   ____________________________ Cheral Marker. Ola Spurr, MD dpf:LT D: 12/12/2013 21:04:00 ET T: 12/12/2013 21:45:27 ET JOB#: [id number]  cc: Cheral Marker. Ola Spurr, MD, <Dictator> Lenka Zhao Ola Spurr MD ELECTRONICALLY SIGNED 12/20/2013 9:54

## 2014-06-17 NOTE — Consult Note (Signed)
   Comments   I spoke with RN at SNF who completed MOST form with pt. She says that, at that time (08/02/2013), pt was optimistic about his recovery, was working with PT, etc. She says that since that time, pt has declined significantly. He seemed depressed and much less optimistic about his recovery. She is not sure that he would make the same decisions regarding end-of-life care now as he did in the past.  spoke with sister in [place]. Updated her on pt's current condition including requirement for pressors. I also told her of above conversation with RN at SNF. I asked that she speak with her siblings and consider whether they want pt to remain a full code. Sister will do this and get back to me.   Electronic Signatures for Addendum Section:  Asal Teas, Harriett SineNancy (MD) (Signed Addendum 07-Oct-15 20:44)  Pt's sister returned call. She and her brother and sister have talked and cannot come to an agreement on code status. Will continue full code status.   Electronic Signatures: Yuliya Nova, Harriett SineNancy (MD)  (Signed 07-Oct-15 11:56)  Authored: Palliative Care   Last Updated: 07-Oct-15 20:44 by Trystyn Dolley, Harriett SineNancy (MD)

## 2014-06-17 NOTE — Consult Note (Signed)
   Comments   I received a call from one of pt's sisters, Alice RiegerDee Hallett. She says that pt's siblings have discussed the options presented and have chosen to proceed with continued aggressive medical therapy including amputation and PEG if needed.  consent, contact sister Alice RiegerDee Hallett [phone] (H) or [phone] (c) or sister Eulah PontJulie Menge [phone]   Electronic Signatures: Vail Vuncannon, Harriett SineNancy (MD)  (Signed 13-Oct-15 13:21)  Authored: Palliative Care   Last Updated: 13-Oct-15 13:21 by Elspeth Blucher, Harriett SineNancy (MD)

## 2014-06-17 NOTE — Consult Note (Signed)
Brief Consult Note: Diagnosis: sepsis/ams.   Patient was seen by consultant.   Comments: Appreciate consult for 54 y/o caucasian man admitted with sepsis relating to chronic ulcers for evaluation for PEG placement due to protein calorie malnutrition. Patient has hx of extensive gastric bypass surgery:believes it was duodenal switch with some type of biliary diversion: this was done in [place] about 4765yr ago. 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 He is currently on a dysphagia diet, but says he really hasn't had any problems with obstructions to swallowing. He is currently on a dysphagia diet with thickened liquids. Has not had much po intake over the last week. States he is not having any abdominal pain, reflux issues, problems with bowel habits, nor any other GI complaints. He is currently alert, although mildly confused at times. A family friend is at the bedside. Do note that he has some mild coagulopathy although not on anticoagulants, and denies history of liver disease, and a left AKA is being considered for nonhealing ulcers/PVD. Has had right bka last year. Was admitted also with sepsis and on antibiotics. Noted also allbumin of 1 and generalized anasarca; ALP some elevated: this may be physiologic due to his surgery in the small bowel.  Palliative care is following as well.  Impression and plan: Protein calorie malnutrition (chronic): would recommend initiation of TPN as bridge  while in consideration if PEG necessary- as well as nutritionist recommendations. Would also consult bariatric surgery for evaluation of this, as his extensive GI surgery may preclude him from being a candidate for a tube placement. His coagulopathy of uncertain origin may also influence his candidacy for tube placement. Can also consider NG feedings to supplement his nutrition.  Electronic Signatures: Vevelyn PatLondon, Naomie Crow H (NP)  (Signed 13-Oct-15 18:12)  Authored: Brief Consult Note   Last Updated: 13-Oct-15  18:12 by Keturah BarreLondon, Keano Guggenheim H (NP)

## 2014-06-17 NOTE — H&P (Signed)
PATIENT NAME:  [name], [name] MR#:  [id number] DATE OF BIRTH:  22-Sep-1960  REFERRING PHYSICIAN: Bobetta LimeEryka A. Inocencio HomesGayle, MD  FAMILY PHYSICIAN: Teena Iraniavid M. Terance HartBronstein, MD   REASON FOR ADMISSION: Altered mental status with presumed sepsis.   HISTORY OF PRESENT ILLNESS: The patient is a 54 year old female who resides at [facility].  He is a full code there. The patient has a history of chronic pain with osteomyelitis and peripheral vascular disease. Has chronic anemia. He is chronically on narcotics. Presents to the Emergency Room from the facility today with altered mental status and lethargy. In the Emergency Room, the patient was hypotensive and tachycardic. He was noted to have a markedly elevated white count. His initial lethargy and altered mental status improved with Narcan. He is unable to void and a Foley catheter could not be placed at this time. His chest x-ray was essentially unremarkable. He does have diffuse skin ulcerations noted all over his legs and is now admitted for further evaluation.   PAST MEDICAL HISTORY: 1.  Chronic pressure ulcerations.  2.  Chronic hyponatremia.  3.  History of hypokalemia.  4.  Anemia of chronic disease.  5.  Osteomyelitis.  6.  Peripheral vascular disease.  7.  Depression.  8.  Chronic pain. 9.  Hypothyroidism.   MEDICATIONS: 1.  Colace 100 mg p.o. b.i.d.  2.  Iron sulfate 325 mg p.o. daily.  3.  Synthroid 175 mcg p.o. daily.  4.  Multivitamin 1 p.o. daily.  5.  Aleve 220 mg p.o. daily.  6.  Spironolactone 25 mg p.o. daily.  7.  Protonix 40 mg p.o. b.i.d.   8.  Citalopram 10 mg p.o. daily.  9.  Lasix 10 mg p.o. b.i.d.  10.  Methadone 15 mg p.o. b.i.d.  11.  Dilaudid 2 mg every 4 hours as needed for pain.   ALLERGIES: No known drug allergies.   SOCIAL HISTORY: Negative for alcohol or tobacco abuse.   FAMILY HISTORY: Unable to obtain.   REVIEW OF SYSTEMS:  Unable to obtain.   PHYSICAL EXAMINATION: GENERAL: The patient is chronically ill-appearing  and confused, but in no acute distress.  VITAL SIGNS: Currently remarkable for a blood pressure of 105/53, with a heart rate 102, respiratory rate of 12, saturation of 100% on oxygen.  HEENT: Normocephalic, atraumatic. Pupils equally round and reactive to light and accommodation. Extraocular movements are intact. Sclerae are not icteric. Conjunctivae were pale.  Oropharynx is dry, but clear. NECK: Supple without JVD. No adenopathy or thyromegaly is noted.  LUNGS: Reveal basilar rhonchi without wheezes or rales. No dullness. Respiratory effort is normal.  CARDIAC: Rapid rate with a regular rhythm.  Normal S1 and S2.  No significant rubs or gallops. PMI is nondisplaced. Chest wall is nontender.  ABDOMEN: Soft, nontender, with normoactive bowel sounds. No organomegaly or masses were appreciated. No hernias or bruits were noted.  EXTREMITIES: Revealed 2+ edema and status post right AKA. Multiple lower extremity ulcerations were noted as well as sacral decubitus ulcerations were present with purulent drainage.   EKG revealed sinus rhythm with no acute ischemic changes. Chest x-ray was unremarkable.   LABORATORY DATA:  His white count was 20.3 with a hemoglobin of 8.9. Glucose was 81 with a BUN of 52, creatinine of 2.48 and a sodium of 133, potassium of 5.4 and a GFR of 29. Lactic acid was mildly elevated at 0.9.   ASSESSMENT: 1.  Presumed sepsis.  2.  Multiple skin and sacral decubitus ulcerations.  3.  Anemia  of chronic disease.  4.  Dehydration.  5.  Acute renal failure.  6.  Chronic hyponatremia.  7.  Chronic pain syndrome.  8.  Hypothyroidism.   PLAN: The patient will be admitted as a full code per his orders from the nursing facility. He will be started on IV fluids with IV antibiotics. Will minimize his pain medications. Will use Zofran as needed for nausea. We will consult wound center because of his multiple wounds. We will also obtain a urology consult because of his urinary retention. We  will send off anemia labs and guaiac all stools. Follow up routine labs in the morning. Further treatment and evaluation will depend upon the patient's progress.   TOTAL TIME SPENT ON THIS PATIENT: 50 minutes.   ____________________________ Duane Lope Judithann Sheen, MD jds:LT D: 11/27/2013 18:18:06 ET T: 11/27/2013 18:55:19 ET JOB#: [id number]  cc: Duane Lope. Judithann Sheen, MD, <Dictator> Teena Irani. Terance Hart, MD Shannel Zahm Rodena Medin MD ELECTRONICALLY SIGNED 11/28/2013 21:41

## 2014-06-17 NOTE — Consult Note (Signed)
   Comments   I spoke with pt's sister, Julie Menge, by phone. She has been uEulah Pontpdated on pt's current condition by RN and Dr Allena KatzPatel. She and her sister will be here this week-end to see pt. She confirms that pt has never married, has no children and that his parents are deceased. Sister does not think that pt has a HCPOA and understands that she and her siblings will be his decision-makers if he cannot make decisions.  was unaware that pt had completed a MOST form and that he had chosen very aggressive care including intubation and feeding tube. She hopes that she and her sister can discuss this with him further when they are here this week-end.   Electronic Signatures: Bettyann Birchler, Harriett SineNancy (MD)  (Signed 06-Oct-15 16:02)  Authored: Palliative Care   Last Updated: 06-Oct-15 16:02 by Keishawna Carranza, Harriett SineNancy (MD)

## 2014-06-17 NOTE — Consult Note (Signed)
CHIEF COMPLAINT and HISTORY:  Subjective/Chief Complaint AMS   History of Present Illness Patient is admitted with altered mental status and markedly elevated WBC with presumed sepsis.  He is confused and barely able to speak more than a couple words, so he is unable to provide much history.  He has a long standing history of a large poorly healing left leg ulcer.  This encompassess much of the shin and lower leg area and involves several toes.  The bone appears exposed on the second toe today.  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  This was in [place], and at that time he refused left leg amputation.  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  His sister has reported progressive decline and not eating for several months.  he has massive LE edema that has been a chronic problem.  He was hypotensive on admission and his WBC was 20K.   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  ALLERGIES:  Allergies:  No Known Allergies:   HOME MEDICATIONS:  Home Medications: Medication Instructions Status  HYDROmorphone 2 mg oral tablet 1 tab(s) orally every 4 hours, As Needed - for Pain Active  polyethylene glycol 3350 - oral powder for reconstitution 17 gram(s) in 4-8 oz of fluid and drink orally once a day as needed for constipation Active  furosemide 20 mg oral tablet 0.5 tab (2m) orally 2 times a day Active  atropine-diphenoxylate 0.025 mg-2.5 mg oral tablet 2 tab(s) orally every 12 hours Active  methadone 10 mg/mL oral concentrate 15 milligram(s) orally every 12 hours Active  naproxen 250 mg oral tablet 1 tab(s) orally once a day Active  pantoprazole 40 mg oral delayed release tablet 1 tab(s)  orally every 12 hours Active  pro-stat sugar free liquid 30 milliliter(s) orally 3 times a day Active  Questran Packets 4 g/9 g oral powder for reconstitution 1 packet(s) orally every 12 hours Active  citalopram 10 mg oral tablet 1 tab(s) orally once a day Active  spironolactone 25 mg oral tablet 1 tab(s) orally once a day Active  docusate sodium sodium 100 mg oral capsule 1 cap(s) orally once a day Active  ferrous sulfate 325 mg oral tablet 1 tab(s) orally once a day Active  levothyroxine 175 mcg (0.175 mg) oral tablet 1 tab(s) orally once a day Active  multivitamin 1 tab(s) orally once a day Active  naproxen sodium 220 mg oral tablet 1 tab(s) orally once a day Active   Family and Social History:  Family History Non-Contributory   Social History negative tobacco, negative ETOH   Place of Living Nursing Home   Review of Systems:  ROS Pt not able to provide ROS   Medications/Allergies Reviewed Medications/Allergies reviewed   Physical Exam:  GEN critically ill appearing, pale and chronically ill appearing   HEENT pale conjunctivae, moist oral mucosa   NECK No masses  trachea midline   RESP postive use of accessory muscles  crackles   CARD regular rate  no JVD   VASCULAR ACCESS none   ABD denies tenderness  soft   GU foley catheter in place  dark urine draining  LYMPH negative neck, negative axillae   EXTR positive edema, He has massive LE edema.  he is already s/p right BKA.  His left leg has no palpable pulses by has good cap refill into the foot.  Massive ulcers as below   SKIN positive ulcers, Right BKA healed.  Left leg with ulcers encompassing a large part of the shin and calf area and going down to the foot and toes.  Bone is exposed on toe two.  He has some necrotic eschar.  Mild surrounding erythema.  Swelling is very severe   NEURO difficult to assess.  Very lethargic. Does move all four extremities, but is quite weak.   PSYCH poor insight, lethargic    LABS:  Laboratory Results: Thyroid:    05-Oct-15 06:18, Thyroid Stimulating Hormone  Thyroid Stimulating Hormone 2.08  0.45-4.50  (IU = [facility])   -----------------------  Pregnant patients have   different reference   ranges for TSH:   - - - - - - - - - -   Pregnant, first trimetser:   0.36 - 2.50 uIU/mL  Hepatic:    04-Oct-15 15:29, Comprehensive Metabolic Panel  Bilirubin, Total -  Alkaline Phosphatase -  46-116  NOTE: New Reference Range  09/13/13  SGPT (ALT) -  14-63  NOTE: New Reference Range  09/13/13  SGOT (AST) -  Total Protein, Serum -  Albumin, Serum -    04-Oct-15 16:06, Comprehensive Metabolic Panel  Bilirubin, Total 0.4  Alkaline Phosphatase 142  46-116  NOTE: New Reference Range  09/13/13  SGPT (ALT) 31  14-63  NOTE: New Reference Range  09/13/13  SGOT (AST) 43  Total Protein, Serum 4.7  Albumin, Serum 1.3    05-Oct-15 06:18, Comprehensive Metabolic Panel  Bilirubin, Total 0.6  Alkaline Phosphatase 157  46-116  NOTE: New Reference Range  09/13/13  SGPT (ALT) 35  14-63  NOTE: New Reference Range  09/13/13  SGOT (AST) 44  Total Protein, Serum 5.5  Albumin, Serum 1.3  Routine Micro:    04-Oct-15 15:29, Blood Culture  Micro Text Report   BLOOD CULTURE    COMMENT                   IN AEROBIC BOTTLE ONLY    GRAM STAIN                GRAM NEGATIVE ROD     ANTIBIOTIC  Culture Comment   IN AEROBIC BOTTLE ONLY  Gram Stain 1   GRAM NEGATIVE ROD    04-Oct-15 16:06, Blood Culture  Micro Text Report   BLOOD CULTURE    COMMENT                   IN AEROBIC BOTTLE ONLY    GRAM STAIN                GRAM NEGATIVE ROD     ANTIBIOTIC  Culture Comment   IN AEROBIC BOTTLE ONLY  Gram Stain 1   GRAM NEGATIVE ROD    04-Oct-15 19:13, Wound Aerobic Culture  Micro Text Report   WOUND AEROBIC CULTURE    COMMENT                   HOLDING FOR POSSIBLE PATHOGEN    GRAM STAIN                RARE WHITE BLOOD CELLS    GRAM STAIN  MODERATE GRAM NEGATIVE ROD    GRAM STAIN                FEW GRAM POSITIVE ROD    GRAM STAIN                RARE GRAM POSITIVE COCCI IN PAIRS IN CLUSTERS     ANTIBIOTIC  Specimen Source ULCER  Culture Comment   HOLDING FOR POSSIBLE PATHOGEN  Gram Stain 1   RARE WHITE BLOOD CELLS  Gram Stain 2   MODERATE GRAM NEGATIVE ROD  Gram Stain 3   FEW GRAM POSITIVE ROD  Gram Stain 4   RARE GRAM POSITIVE COCCI IN PAIRS IN CLUSTERS   Result(s) reported on 28 Nov 2013 at 09:22AM.    04-Oct-15 19:33, Urine Culture  Micro Text Report   URINE CULTURE    COMMENT                   COLONIES TOO SMALL TO READ     ANTIBIOTIC  Specimen Source   IN AND OUT CATH  Culture Comment   COLONIES TOO SMALL TO READ   Result(s) reported on 28 Nov 2013 at 10:51AM.  Lab:    04-Oct-15 15:40, ABG  pH (ABG) 7.110  7.350-7.450  NOTE: New Reference Range  09/17/13  PCO2 38  32-48  NOTE: New Reference Range  10/04/13  PO2 136  83-108  NOTE: New Reference Range  09/17/13  FiO2 100  Base Excess -17  -3-3  NOTE: New Reference Range  10/04/13  HCO3 12.1  21.0-28.0  NOTE: New Reference Range  09/17/13  O2 Saturation 96.9  O2 Device nrb  Specimen Site (ABG)   RT RADIAL  Specimen Type (ABG) ARTERIAL  Patient Temp (ABG) 37.0    05-Oct-15 11:15, ABG  pH (ABG) 7.210  7.350-7.450  NOTE: New Reference Range  09/17/13  PCO2 31  32-48  NOTE: New Reference Range  10/04/13  PO2 34  83-108  NOTE: New Reference Range  09/17/13  FiO2 28  Base Excess -14  -3-3  NOTE: New Reference Range  10/04/13  HCO3 12.4  21.0-28.0  NOTE: New Reference Range  09/17/13  Specimen Site (ABG)   LT RADIAL  Specimen Type (ABG) venous  Cardiology:    04-Oct-15 15:02, ED ECG  Ventricular Rate 85  Atrial Rate 85  P-R Interval 120  QRS Duration 84  QT 388  QTc 461  P Axis 24  R Axis -17  T Axis 46  ECG interpretation   Normal sinus rhythm  Possible Anterolateral infarct , age undetermined  Abnormal ECG  No  previous ECGs available  ----------unconfirmed----------  Confirmed by OVERREAD, NOT ([id number]), editor PEARSON, BARBARA ([id number]) on 11/28/2013 12:52:38 PM  ED ECG   Routine Chem:    04-Oct-15 15:29, Blood Culture  Result Comment   AEROBIC BOTTLE - NOTIFIED OF CRITICAL VALUE   - CTJ TO STAEY COLLIE @ 1026 ON 11-28-13.   - READ-BACK PROCESS PERFORMED.   Result(s) reported on 28 Nov 2013 at 10:30AM.    04-Oct-15 15:29, CBC Profile  Result Comment   all labs - cancel specimens hemolyzed   - called to jean Martinique @ 1540 on 11/27/13   - caf   Result(s) reported on 27 Nov 2013 at 03:43PM.    04-Oct-15 15:29, Comprehensive Metabolic Panel  Glucose, Serum -  BUN -  Creatinine (comp) -  Sodium, Serum -  Potassium, Serum -  Chloride, Serum -  CO2, Serum -  Calcium (Total), Serum -  Osmolality (calc) -  eGFR (African American) -  eGFR (Non-African American) -  eGFR values <63m/min/1.73 m2 may be an indication of chronic  kidney disease (CKD).  Calculated eGFR, using the MRDR Study equation, is useful in   patients with stable renal function.  The eGFR calculation will not be reliable in acutely ill patients  when serum creatinine is changing rapidly. It is not useful in  patients on dialysis. The eGFR calculation may not be applicable  to patients at the low and high extremes of body sizes, pregnant  women, and vetetarians.  Anion Gap -    04-Oct-15 15:29, Magnesium, Serum  Magnesium, Serum -  1.8-2.4  THERAPEUTIC RANGE: 4-7 mg/dL  TOXIC: > 10 mg/dL   -----------------------    04-Oct-15 15:29, Phosphorus, Serum  Phosphorus, Serum -  Result(s) reported on 27 Nov 2013 at 03:44PM.    04-Oct-15 15:40, ABG  Result Comment   - NOTIFIED OF CRITICAL VALUE   - HAND DELIVERED   - to dr gEdd Fabian_0  on 11/27/13   Result(s) reported on 27 Nov 2013 at 03:46PM.    04-Oct-15 16:06, Blood Culture  Result Comment   AEROBIC BOTTLE - CRITICAL VALUE PREVIOUSLY NOTIFIED.   Result(s) reported on 28 Nov 2013 at 10:32AM.    04-Oct-15 16:06, Comprehensive Metabolic Panel  Glucose, Serum 81  BUN 52  Creatinine (comp) 2.48  Sodium, Serum 133  Potassium, Serum 5.4  Chloride, Serum 110  CO2, Serum 12  Calcium (Total), Serum 6.7  Osmolality (calc) 279  eGFR (African American) 35  eGFR (Non-African American) 29  eGFR values <654mmin/1.73 m2 may be an indication of chronic  kidney disease (CKD).  Calculated eGFR, using the MRDR Study equation, is useful in   patients with stable renal function.  The eGFR calculation will not be reliable in acutely ill patients  when serum creatinine is changing rapidly. It is not useful in  patients on dialysis. The eGFR calculation may not be applicable  to patients at the low and high extremes of body sizes, pregnant  women, and vetetarians.  Result Comment   CALCIUM - RESULTS VERIFIED BY REPEAT TESTING.   - NOTIFIED OF CRITICAL VALUE   - CALLED TO DONALD SWEENEY @ [id number] - ON 11/27/2013 CAF   - READ-BACK PROCESS PERFORMED.   Result(s) reported on 27 Nov 2013 at 05:07PM.  Anion Gap 11    04-Oct-15 16:06, Magnesium, Serum  Magnesium, Serum 1.9  1.8-2.4  THERAPEUTIC RANGE: 4-7 mg/dL  TOXIC: > 10 mg/dL   -----------------------    04-Oct-15 16:06, Phosphorus, Serum  Phosphorus, Serum 7.4  Result(s) reported on 27 Nov 2013 at 05:07PM.    05-Oct-15 06:18, Comprehensive Metabolic Panel  Glucose, Serum 106  BUN 45  Creatinine (comp) 2.01  Sodium, Serum 135  Potassium, Serum 4.7  Chloride, Serum 112  CO2, Serum 14  Calcium (Total), Serum 6.4  Osmolality (calc) 282  eGFR (African American) 45  eGFR (Non-African American) 37  eGFR values <602min/1.73 m2 may be an indication of chronic  kidney disease (CKD).  Calculated eGFR, using the MRDR Study equation, is useful in   patients with stable renal function.  The eGFR calculation will not be reliable in acutely ill patients  when serum creatinine is changing rapidly. It is not useful  in  patients on dialysis. The eGFR calculation may not be applicable  to patients at the low and high extremes of body sizes, pregnant  women,  and vetetarians.  Result Comment   Calcium - RESULTS VERIFIED BY REPEAT TESTING.   - NOTIFIED OF CRITICAL VALUE   - Notified Adele Barthel @ [phone] on 11/28/13   - SFJ   - READ-BACK PROCESS PERFORMED.   Result(s) reported on 28 Nov 2013 at 07:26AM.  Anion Gap 9    05-Oct-15 06:18, Ferritin [facility])  Ferritin [facility]) 1022  Result(s) reported on 28 Nov 2013 at 07:05AM.    05-Oct-15 11:15, ABG  Result Comment   - hd dr Mortimer Fries 11-28-13   Result(s) reported on 28 Nov 2013 at 11:21AM.    05-Oct-15 11:26, Ammonia, Plasma  Ammonia, Plasma 56  Result(s) reported on 28 Nov 2013 at 12:14PM.    05-Oct-15 11:26, Iron and IBC [facility])  Iron Binding Capacity (TIBC) 30  Unbound Iron Binding Capacity 8  Iron, Serum 22  Iron Saturation 73  Result(s) reported on 28 Nov 2013 at 12:15PM.  Cardiac:    04-Oct-15 15:29, Troponin I  Troponin I -  0.00-0.05  0.05 ng/mL or less: NEGATIVE   Repeat testing in 3-6 hrs   if clinically indicated.  >0.05 ng/mL: POTENTIAL   MYOCARDIAL INJURY. Repeat   testing in 3-6 hrs if   clinically indicated.  NOTE: An increase or decrease   of 30% or more on serial   testing suggests a   clinically important change    04-Oct-15 16:06, Troponin I  Troponin I < 0.02  0.00-0.05  0.05 ng/mL or less: NEGATIVE   Repeat testing in 3-6 hrs   if clinically indicated.  >0.05 ng/mL: POTENTIAL   MYOCARDIAL INJURY. Repeat   testing in 3-6 hrs if   clinically indicated.  NOTE: An increase or decrease   of 30% or more on serial   testing suggests a   clinically important change  Routine UA:    04-Oct-15 19:33, Urinalysis  Color (UA) Yellow  Clarity (UA) Cloudy  Glucose (UA) Negative  Bilirubin (UA) Negative  Ketones (UA) Negative  Specific Gravity (UA) 1.011  Blood (UA) 3+  pH (UA) 5.0  Protein (UA) Negative  Nitrite (UA) Negative   Leukocyte Esterase (UA) 3+  Result(s) reported on 27 Nov 2013 at 07:53PM.  RBC (UA) 4 /HPF  WBC (UA) 25 /HPF  Bacteria (UA) 3+  Epithelial Cells (UA) 1 /HPF  WBC Clump (UA) PRESENT  Mucous (UA) PRESENT  Hyaline Cast (UA) 25 /LPF  Result(s) reported on 27 Nov 2013 at 07:53PM.  Routine Coag:    04-Oct-15 15:29, Prothrombin Time  Prothrombin -  INR -  INR reference interval applies to patients on anticoagulant therapy.  A single INR therapeutic range for coumarins is not optimal for all  indications; however, the suggested range for most indications is  2.0 - 3.0.  Exceptions to the INR Reference Range may include: Prosthetic heart  valves, acute myocardial infarction, prevention of myocardial  infarction, and combinations of aspirin and anticoagulant. The need  for a higher or lower target INR must be assessed individually.  Reference: The Pharmacology and Management of the Vitamin K   antagonists: the seventh ACCP Conference on Antithrombotic and  Thrombolytic Therapy. OHYWV.3710 Sept:126 (3suppl): [id number].  A HCT value >55% may artifactually increase the PT.  In one study,   the increase was an average of 25%.  Reference:  "Effect on Routine and Special Coagulation Testing Values  of Citrate Anticoagulant Adjustment in Patients with High HCT Values."  American Journal of Clinical Pathology 2006;126:400-405.    05-Oct-15 05:02, Prothrombin  Time  Prothrombin 27.0  INR 2.6  INR reference interval applies to patients on anticoagulant therapy.  A single INR therapeutic range for coumarins is not optimal for all  indications; however, the suggested range for most indications is  2.0 - 3.0.  Exceptions to the INR Reference Range may include: Prosthetic heart  valves, acute myocardial infarction, prevention of myocardial  infarction, and combinations of aspirin and anticoagulant. The need  for a higher or lower target INR must be assessed individually.  Reference: The  Pharmacology and Management of the Vitamin K   antagonists: the seventh ACCP Conference on Antithrombotic and  Thrombolytic Therapy. [id number] Sept:126 (3suppl): [id number].  A HCT value >55% may artifactually increase the PT.  In one study,   the increase was an average of 25%.  Reference:  "Effect on Routine and Special Coagulation Testing Values  of Citrate Anticoagulant Adjustment in Patients with High HCT Values."  American Journal of Clinical Pathology 2006;126:400-405.  Routine Hem:    04-Oct-15 15:29, CBC Profile  WBC (CBC) -  RBC (CBC) -  Hemoglobin (CBC) -  Hematocrit (CBC) -  Platelet Count (CBC) -  MCV -  MCH -  MCHC -  RDW -  Neutrophil % -  Lymphocyte % -  Monocyte % -  Eosinophil % -  Basophil % -  Neutrophil # -  Lymphocyte # -  Monocyte # -  Eosinophil # -  Basophil # -  Bands -  Segmented Neutrophils -  Lymphocytes -  Variant Lymphocytes -  Monocytes -  Eosinophil -  Basophil -  Metamyelocyte -  Myelocyte -  Promyelocyte -  Blast-Like -  Other Cells -  NRBC -  Diff Comment 1 -  Diff Comment 2 -  Diff Comment 3 -  Diff Comment 4 -  Diff Comment 5 -  Diff Comment 6 -  Diff Comment 7 -  Diff Comment 8 -  Diff Comment 9 -  Diff Comment 10 -  Result(s) reported on 27 Nov 2013 at 03:43PM.    04-Oct-15 16:06, CBC Profile  WBC (CBC) 20.3  RBC (CBC) 2.87  Hemoglobin (CBC) 8.9  Hematocrit (CBC) 29.1  Platelet Count (CBC) 244  Result(s) reported on 27 Nov 2013 at 04:42PM.  MCV 101  MCH 30.9  MCHC 30.5  RDW 14.5  Bands 6  Segmented Neutrophils 85  Lymphocytes 3  Monocytes 4  Metamyelocyte 1  Myelocyte 1  Diff Comment 1   HYPOCHROMIA  Diff Comment 2   PLTS VARIED IN SIZE  Diff Comment 3   BURR CELLS   Result(s) reported on 27 Nov 2013 at 04:42PM.    05-Oct-15 06:18, CBC Profile  WBC (CBC) 21.7  RBC (CBC) 3.19  Hemoglobin (CBC) 9.6  Hematocrit (CBC) 31.9  Platelet Count (CBC) 283  MCV 100  MCH 30.2  MCHC 30.2  RDW 13.9   Neutrophil % 93.4  Lymphocyte % 3.5  Monocyte % 2.7  Eosinophil % 0.2  Basophil % 0.2  Neutrophil # 20.3  Lymphocyte # 0.7  Monocyte # 0.6  Eosinophil # 0.1  Basophil # 0.0  Result(s) reported on 28 Nov 2013 at 07:26AM.    05-Oct-15 06:18, Sedimentation Rate  Erythrocyte Sed Rate 28  Result(s) reported on 28 Nov 2013 at 08:03AM.   RADIOLOGY:  Radiology Results: XRay:    04-Oct-15 16:13, Chest Portable Single View  Chest Portable Single View  REASON FOR EXAM:    Sepsis  COMMENTS:  PROCEDURE: DXR - DXR PORTABLE CHEST SINGLE VIEW  - Nov 27 2013  4:13PM     CLINICAL DATA:  Respiratory distress, r/o sepsis, diabetes, pt  unable to verbalize history. Initial encounter.    EXAM:  PORTABLE CHEST - 1 VIEW    COMPARISON:  None.    FINDINGS:  Patient rotated left. The chin overlies left apex. Cardiomegaly  accentuated by AP portable technique. No right and no definite left  pleural effusion. No pneumothorax. No congestive failure. No lobar  consolidation.     IMPRESSION:  Decreased sensitivity and specificity exam due to technique related  factors, as described above.    Cardiomegaly and low lung volumes without definite acute disease.      Electronically Signed    By: Abigail Miyamoto M.D.    On: 11/27/2013 16:20     Verified By: Areta Haber, M.D.,    05-Oct-15 02:20, Chest Portable Single View  Chest Portable Single View  REASON FOR EXAM:    central line placement  COMMENTS:       PROCEDURE: DXR - DXR PORTABLE CHEST SINGLE VIEW  - Nov 28 2013  2:20AM     CLINICAL DATA:  Right subclavian central line placement.    EXAM:  PORTABLE CHEST - 1 VIEW    COMPARISON:  Chest radiograph performed 11/27/2013    FINDINGS:  A right subclavian line is noted ending overlying the right  cavoatrial junction.  The lungs are hypoexpanded. Mild vascular congestion is noted. Mild  bibasilar atelectasis is seen. No pleural effusion or pneumothorax  identified.    The  cardiomediastinal silhouette is normal in size. No acute osseous  abnormalities are identified.     IMPRESSION:  1. Right subclavian line noted ending overlying the right cavoatrial  junction.  2. Lungs hypoexpanded. Mild vascular congestion noted. Mild  bibasilar atelectasis seen.      Electronically Signed    By: Garald Balding M.D.    On: 11/28/2013 03:10         Verified By: JEFFREY . Radene Knee, M.D.,  [place]:    04-Oct-15 16:13, Chest Portable Single View  PACS Image    05-Oct-15 02:20, Chest Portable Single View  PACS Image   ASSESSMENT AND PLAN:  Assessment/Admission Diagnosis severe LE wounds on LLE involving skin, soft tissue, muscle, and even bone in the foot and toe area. Severe LE swelling S/P amputation already to RLE for infection/ulceration Poor PO intake reported   Plan He has actually undergone non-invasive arterial studies as an outpatient a couple of months ago and he did not appear to have arterial inusufficiency or venous thrombotic disease.  He has had vascular studies in [place] as well about a year or two ago that also showed no arterial insufficiency.  These ulcers were likely a result of tremendous lower extremity swelling and skin breakdown many months ago and this also limited his healing.  It may be his low protein state also impaired his wound healing.   Would think the likelihood of these wounds healing is quite low.  With apparent bone involvement in the foot, this is very unlikely to heal.  His skin situation would likely require an AKA to good intact skin coverage.  He has apparently refused this previously.   With previous outpatient vascular studies, do not think angiogram is likely of benefit for him.   Would try to elevate leg and use a compression wrap or UNNA Boot if possible. If determined he needs  and AKA and he/family desire this, we can perform but would not be safe now to proceed to surgery until he is stabilized. 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 Will follow  level 5   Electronic Signatures: Algernon Huxley (MD)  (Signed 05-Oct-15 15:04)  Authored: Chief Complaint and History, PAST MEDICAL/SURGICAL HISTORY, ALLERGIES, HOME MEDICATIONS, Family and Social History, Review of Systems, Physical Exam, LABS, RADIOLOGY, Assessment and Plan   Last Updated: 05-Oct-15 15:04 by Algernon Huxley (MD)

## 2014-06-17 NOTE — Consult Note (Signed)
   Comments   I called pt's sister/HCPOA, Eulah PontJulie Menge. No answer. Message left. Will try again in AM.   Electronic Signatures: Broedy Osbourne, Harriett SineNancy (MD)  (Signed 12-Oct-15 17:16)  Authored: Palliative Care   Last Updated: 12-Oct-15 17:16 by Defne Gerling, Harriett SineNancy (MD)

## 2014-06-17 NOTE — Consult Note (Signed)
Urology Procedure Note: Foley Catheter Placement, Complicated (due to altered anatomy: recessed/buried penis) Allison OlpJay H. Gena Laski, M.D. for Procedure: Inability to void for >12 hrs with 300mL on Bladder Scan.  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  Multiple attempts at foley catheter placement unsuccessful by the ER Staff. in Detail: sterilely gloved assistant maintained exposure of the glans penis after deep compression of the soft tissues lateral to the penis bilaterally was successful in exposing the glans and urethral meatus. pt was then prepped and draped sterilely.  The urethra was then distended with 5mL of sterile lubrifax.  A 16 French foley was then placed per urethra to the hub.  10mL of sterile water was utilized to inflate the balloon.  The foley was then irrigated with 30mL of sterile saline prompting the release of >27400mL of cloudy amber urine after placement to straight drainage. sample of urine was sent for UA, C&S.    Electronic Signatures: Allison OlpKim, Haely Leyland H (MD)  (Signed on 04-Oct-15 19:48)  Authored  Last Updated: 04-Oct-15 19:48 by Allison OlpKim, Philip Eckersley H (MD)

## 2014-06-25 NOTE — Discharge Summary (Signed)
PATIENT NAME:  [name], [name] MR#:  [id number] DATE OF BIRTH:  Aug 10, 1960  DATE OF ADMISSION:  04/03/2014 DATE OF DISCHARGE:  04/05/2014  PRESENTING COMPLAINT: Fevers.  DISCHARGE DIAGNOSES:  1.  Methicillin-resistant Staphylococcus aureus and enterococcus sepsis, likely PICC line infection, removed.  2.  Chronic left lower extremity wound.  3.  Malnutrition, on IV TPN at home.  4.  Chronic pain syndrome.   CONDITION ON DISCHARGE: Fair.   CODE STATUS: Full code.   MEDICATIONS:  1.  Vancomycin, per Dr. Jarrett Ables orders, 1500 mg IV q. 12 hourly.  2.  Oxycodone 5 mg every 6 hours as needed.  3.  Methadone 5 mg 2 tablets every 8 hours.  4.  Torsemide 20 mg 1-1/2 tablets b.i.d.  5.  Tylenol 325 one to two every q. 4 hourly.  6.  Colace 100 mg p.o. daily.  7.  Remeron 15 mg 1/2 tablet daily at bedtime.  8.  Synthroid 200 mcg p.o. daily.   DISCHARGE INSTRUCTIONS:  1.  Home health RN to be resumed. IV antibiotics.and ressing and IV TPN.  2.  Follow up with Dr. Sampson Goon in 1 to 2 weeks.  3.  [facility] with [facility].  4.  The patient to follow up with Dr. Juel Burrow, who is going to be his primary care physician, on Friday, February 12, at 11:30 a.m.   LABORATORY DATA: Repeat blood cultures from February 8 and February 9 are negative. Echo Doppler negative for any vegetations. White count is 5.5, H and H is 8 and 25.6. Blood cultures from February 6 and February 7 are enterococcus and MRSA positive. UA negative for UTI. The patient's albumin is 2.0.   CONSULTATIONS: Infectious disease consultation, Stann Mainland. Sampson Goon, MD.  BRIEF SUMMARY OF HOSPITAL COURSE: Mr. 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 He was admitted with:  1.  Sepsis. Blood cultures grew MRSA and enterococcus, likely due to PICC line sepsis. He has had it for 2 months from which he was getting his TPN. PICC line was removed on the day of admission. His blood cultures from 6th and 7th were positive, blood cultures from 8th and 9th are negative. The patient will get a double lumen PICC line placed again for his IV antibiotics, which is going to be vancomycin and we will resume his TPN. Vancomycin levels will be followed by Dr. Sampson Goon. The patient will follow up with him as outpatient. Home health has been resumed. Echocardiogram is negative for vegetation.  2.  Malabsorption syndrome with hypoalbuminemia. Started on TPN at [facility] 2 months ago. We will replace PICC line today. The patient will be resumed back on his previous TPN orders. The patient will need some duodenal, pancreatic or biliary diversion procedure as follow up from his previous bariatric surgery.  3.  Chronic left leg ulceration. Vascular recommended above-knee amputation at that time. The patient refused. His wounds will require dressing changes, which can be done by home health RN and can follow up with [facility] as before.  4.  Chronic pain syndrome, on methadone and oxycodone.  5.  Overall, hospital stay otherwise remained stable.   CODE STATUS: The patient remained a full code.   TIME SPENT: Forty minutes.    ____________________________  Kennita Pavlovich A. Allena KatzPatel, MD sap:TM D: 04/05/2014 14:38:50 ET T: 04/05/2014 22:38:08 ET JOB#: [id number]  cc: Beyonka Pitney A. Allena KatzPatel, MD, <Dictator> Stann Mainlandavid P. Sampson GoonFitzgerald, MD Corky DownsJaved Masoud, MD Willow OraSONA A Noelle Sease MD ELECTRONICALLY SIGNED 04/25/2014 17:28

## 2014-06-25 NOTE — Consult Note (Signed)
PATIENT NAME:  [name], [name] MR#:  [id number] DATE OF BIRTH:  [birth year]  DATE OF CONSULTATION:  04/04/2014  REFERRING PHYSICIAN:  Enid Baasadhika Kalisetti, MD CONSULTING PHYSICIAN:  Stann Mainlandavid P. Sampson GoonFitzgerald, MD  REASON FOR CONSULTATION: PICC line infection.   HISTORY OF PRESENT ILLNESS: This is a 54 year old gentleman with very complicated past medical history who was just discharged from [facility] 2 weeks ago. He has a history of chronic osteomyelitis status post right leg AKA as well as chronic draining extremity wounds on the left, peripheral vascular disease, and chronic abdominal pain following gastric bypass surgery who developed fever at home. He has a PICC line in place that he gives himself TPN through. In the ED, he had blood work and blood cultures done and was given a dose of antibiotics and sent home. Blood cultures ended up growing gram-positive cocci. Follow-up blood cultures are also positive. The patient was admitted February 8th for further evaluation. Besides the low grade fever, the patient feels relatively well at home. He administers himself with TPN through the PICC line that has been in place for about 2 months. He does have severe malabsorption from his bariatric surgery and hypoalbuminemia. He is potentially going to have a repeat surgery for his malabsorption in the future. Since admission the PICC line has been pulled and he has been on vancomycin and Zosyn.   PAST MEDICAL HISTORY:   1.  Severe protein calorie malnutrition. 2.  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 3.  Peripheral vascular disease status post right AKA,  4.  Depression.  5.  Chronic pain syndrome.  6.  Hypothyroidism.  7.  Chronic anemia.   PAST SURGICAL HISTORY:  1.  Right AKA. 2.  Bariatric surgery done in [place] in 2006.   ALLERGIES: No known drug allergies.   SOCIAL HISTORY: Lives by himself, but is being followed by [facility]. He has a motorized wheelchair. He was  just discharged [facility] weeks ago. No alcohol or tobacco.   FAMILY HISTORY: Noncontributory.   REVIEW OF SYSTEMS: Eleven systems reviewed and negative, except as per HPI.   ANTIBIOTICS SINCE ADMISSION: Vancomycin and Zosyn.   PHYSICAL EXAMINATION: VITAL SIGNS: Temperature 97.2, pulse 87, blood pressure 96/61, respirations 18, sat 96% on room air.  GENERAL: He is quite chronically ill-appearing.   HEENT: Pupils are reactive. Extraocular movements are intact. Sclerae are anicteric. His oropharynx is clear.  NECK: Supple. He does have some kyphosis.  HEART: Regular.  LUNGS: Clear.  ABDOMEN: Soft, nontender.  EXTREMITIES: He has a right AKA. His left lower extremity has chronic skin changes and ulceration anteriorly. There is some serosanguineous drainage from this.  NEUROLOGIC: He is alert and oriented x3.   DIAGNOSTIC DATA: Blood cultures February 6th are growing Enterococcus faecalis sensitive to ampicillin and linezolid as well as Staphylococcus aureus with sensitivities pending. This is in 1 of the 2 blood cultures. The other blood culture just has enterococcus. Follow-up blood cultures February 7th, one of two, is positive for gram-positive cocci. Follow-up blood cultures February 8th are negative.   White blood count on admission was 5.4, on February 7th, currently it is 5.5, hemoglobin 8, and platelets 268,000. Albumin is 2. Renal function normal.   Imaging: Chest x-ray February 8th was negative.   IMPRESSION: A 54 year old gentleman status post bypass surgery 10 years ago with chronic malabsorption, hypoalbuminemia, malnutrition and peripheral vascular disease status post right above-knee amputation with persistent draining wounds in his left lower  extremity. He has a PICC line in place for the last 2 months for TPN at home. He was admitted with fever and found to have a PICC line infection. The PICC has been removed. Follow-up blood cultures February 8th are negative.    RECOMMENDATIONS: 1.  Continue vancomycin and Zosyn to cover the Staphylococcus aureus and enterococcus.  2.  Could likely be discharged on Zosyn if this is methicillin-sensitive Staphylococcus aureus. This can be given 3 times a day and it is easier to give at home. If it is methicillin-resistant Staphylococcus aureus then I would discharge him just on vancomycin.  3.  He will need weekly labs.  4.  He will likely need a 2 to 4 week course of IV antibiotics.  5.  Echocardiogram is pending.   Thank you for the consult. I will be glad to follow with you.  ____________________________ Stann Mainland. Sampson Goon, MD dpf:sb D: 04/04/2014 14:49:35 ET T: 04/04/2014 15:11:33 ET JOB#: [id number]  cc: Stann Mainland. Sampson Goon, MD, <Dictator> Janiene Aarons Sampson Goon MD ELECTRONICALLY SIGNED 04/05/2014 20:54

## 2014-06-25 NOTE — Discharge Summary (Signed)
PATIENT NAME:  [name], LANZA MR#:  [id number] DATE OF BIRTH:  20-Nov-1960  DATE OF ADMISSION:  04/17/2014 DATE OF DISCHARGE:  04/21/2014  PRIMARY CARE PHYSICIAN: [facility] [employer], Marcos Eke. Hal Hope, MD    FINAL DIAGNOSES:  1.  Systemic inflammatory response syndrome with left lower extremity cellulitis, sacral decubitus, likely contaminate with skin contaminants.  2.  Malnutrition.  3.  Hypokalemia.  4.  Anemia.  5.  Relative hypotension.  6.  Sacral decubitus.   MEDICATIONS ON DISCHARGE: Include oxycodone 5 mg every 6 hours as needed for pain, methadone 5 mg 2 tablets every 8 hours, torsemide 20 mg 1-1/2 tablets twice a day, Tylenol 325 mg 1-2 tablets every 4 hours as needed for pain, Remeron 15 mg 1/2 tablet at bedtime, Synthroid 200 mcg 1 tablet daily, amoxicillin 875 mg twice a day for 13 days, magnesium oxide 400 mg daily, multivitamin with minerals 1 tablet daily, ferrous sulfate 325 mg daily, Pro-Stat 1 packet 3 times a day, line flush normal saline 5 mL injectable daily and as needed, line flush heparin 10 units/mL 5 mL injectable once a day and as needed for line patency. Stop IV vancomycin. Stop Colace.   SERVICES: Home health for PICC line care, help with medications and strength.   WOUND CARE: Cover sacral decubitus with DuoDERM every 5 days.   HOME OXYGEN: No.   DIET: Regular, regular consistency.   ACTIVITY: As tolerated.   FOLLOWUP: In 1-2 weeks with [employer] [facility], Dr. Nadyne Coombes. They can decide whether or not to continue that TPN as outpatient; if TPN is not going to be restarted, then can PICC line.   HOSPITAL COURSE: The patient was admitted 04/16/2014 and discharged 04/21/2014. Came in with fever for 2 days, was admitted with systemic inflammatory response syndrome, bilateral leg cellulitis, sacral decubitus, admitted with fever, hypotension, hypokalemia. The patient was given IV fluid hydration, started on IV antibiotics, potassium  supplementation. Consultation was done by Dr. Sampson Goon, infectious disease.   LABORATORY AND RADIOLOGICAL DATA DURING THE HOSPITAL COURSE: Included a chest x-ray showed stable findings, no acute cardiopulmonary process. Magnesium 1.6. Urinalysis negative. Troponin negative. White blood cell count 3.8, hemoglobin and hematocrit 8.2 and 26.6, platelet count of 192,000. Glucose 71, BUN 25, creatinine 1.05, sodium 134, potassium 3.3, chloride 101, CO2 of 27, calcium 7.7. Alkaline phosphatase 306, ALT 41, AST 78. Blood cultures negative. Urine culture basically negative, only 7000 gram-negative rods. Venous pH normal range. Lactic acid negative. Wound culture grew out many organisms, enterococcus, Klebsiella, and Serratia. Dr. Sampson Goon believed this is more of a skin contaminant rather than source of infection. Stools for Clostridium difficile were negative. Potassium upon discharge was 3.8, magnesium 2.3, albumin 2.0, prealbumin is still pending, creatinine was 1.05, hemoglobin 8.1.   HOSPITAL COURSE PER PROBLEM LIST:  1.  For the patient's systemic inflammatory response syndrome with left lower extremity cellulitis, sacral decubitus, the patient was seen in consultation by Dr. Sampson Goon. He believed the wound culture from the sacrum is going to show contamination from skin organisms and not a true pathogen. He agreed initially with IV vancomycin and Zosyn and switch over to Augmentin for 2 weeks.  2.  Malnutrition. The patient is on TPN at home. The patient wanted to hold off on this speak with his medical doctor prior to restarting. Prealbumin still pending. Albumin low at 2.0.  3.  Hypokalemia and hypomagnesemia. This was replaced during the hospital course.  4.  Anemia. The patient received 1  unit of packed red blood cells. Hemoglobin 8.1. Follow up as outpatient. Start iron supplementation. Can consider Procrit injections as outpatient.  5.  Relative hypotension. Relatively stable at this point.  6.   Sacral decubitus. The patient refused wound care. Follow up as outpatient with medical doctor. This will get worse if he does not get up and ambulate.  7.  Hypothyroidism on Synthroid. Same dose continued.   Time Spent ON discharge: 35 minutes.    ____________________________ Herschell Dimesichard J. Renae GlossWieting, MD rjw:bm D: 04/21/2014 15:44:55 ET T: 04/22/2014 02:33:51 ET JOB#: [id number]  cc: Herschell Dimesichard J. Renae GlossWieting, MD, <Dictator> Marcos EkeKaren L. Hal Hopeichter, MD Salley ScarletICHARD J Lucia Mccreadie MD ELECTRONICALLY SIGNED 05/03/2014 10:45

## 2014-06-25 NOTE — Consult Note (Signed)
PATIENT NAME:  [name], [name]:  [id number] DATE OF BIRTH:  Sep 16, 1960  DATE OF CONSULTATION:  04/17/2014  CONSULTING PHYSICIAN:  Stann Mainland. Sampson Goon, MD  REQUESTING PHYSICIAN: Shaune Pollack, MD  REASON FOR CONSULTATION:  Fever and recent bacteremia.  HISTORY OF PRESENT ILLNESS:  This is a 54 year old gentleman with quite a complicated medical history who was admitted on February 9 with a PICC line infection due to MRSA and enterococcus.  The PICC line at that time was removed.  He cleared his cultures and then had a PICC replaced and was discharged on vancomycin.  He has been giving himself the vancomycin regularly at home.  However, he was readmitted February 21 with symptoms of fever.  He has also been found to have a sacral decubitus ulcer.  He has a history of chronic malnutrition from prior gastric bypass surgery and also chronic osteomyelitis status post right leg AKA.  He has chronic draining wounds in his left as well as peripheral vascular disease.  He has been in and out of the hospital since October of this year.  Most recently, he was discharged from [facility] in mid-January.  He was giving himself TPN at home.    PAST MEDICAL HISTORY:   1.  Severe protein calorie malnutrition.  2.  Chronic nonhealing ulcer of his left lower extremity. 3.  Peripheral vascular disease status post right AKA. 4.  Depression. 5.  Chronic pain syndrome. 6.  Hypothyroidism. 7.  Chronic anemia.  PAST SURGICAL HISTORY:   1.  Right AKA. 2.  Bariatric surgery done in [place] in 2006.  ALLERGIES:  No known drug allergies.    SOCIAL HISTORY:  Lives by himself but was followed with home healthcare.  Prior to that he was in [facility] and has been hospitalized since October 2015. He has a motorized wheelchair and a hospital bed at home he says.  He does not smoke or drink.  FAMILY HISTORY:  Noncontributory.    REVIEW OF SYSTEMS: Eleven systems reviewed and negative except as noted per HPI.   Antibiotics since admission include clindamycin.  PHYSICAL EXAMINATION: VITAL SIGNS: Temperature 99.5, pulse 95, blood pressure 95/60, respirations 20, saturation 91%.  In the ED his temperature was 100.6 on admission.   GENERAL:  He is quite chronically ill-appearing.  He is pale.   HEENT: His pupils are reactive. Oropharynx is clear.   NECK:  Supple. HEART:  Regular. LUNGS: Clear.   EXTREMITIES:  He has a PICC line in his left upper extremity that is functioning well. He has a right AKA site which is well-healed. He has chronic left lower extremity changes with anterior ulceration but these are quite dry.  ABDOMEN: Soft and obese and nontender. NEUROLOGIC: He is awake and alert and interactive.   SKIN:  The patient has several decubitus ulcers on his sacrum.  The most superior one has some tunneling.  There is some drainage and slight odor from it.    LABORATORY DATA: White blood count is 3.8, hemoglobin 8.2, platelets 192,000.  Blood cultures from 04/16/2014 x 2 are negative.  Urine culture negative.  Urinalysis is less than 1.  Renal function creatinine 0.94.  LFTs show albumin 2.2, alkaline phosphatase 306, AST 78. Prior microcultures from his most recent hospitalization had repeat blood cultures positive for MRSA as well as enterococcus that was sensitive to ampicillin and linezolid.    IMAGING:  Chest x-ray showed no acute cardiopulmonary disease.  IMPRESSION:  54 year old gentleman quite complicated with severe  protein calorie malnutrition from a bypass surgery 10 years ago with chronic malabsorption and peripheral vascular disease.  He has already status post AKA and he has chronic draining wounds on his left lower extremity.  He was admitted 2 weeks ago with an enterococcal and MRSA PICC line infection.  The PICC was removed and he was treated with IV vancomycin and after PICC cultures cleared, he had a repeat PICC line placed.  He was discharged on vancomycin.  He has been getting that  at home.  He is readmitted with fevers and a sacral decubitus ulcer that does appear infected.  He had a transthoracic echocardiogram his last admission but did not have TEE so endocarditis is certainly a possibility.  However, I suspect his recurrent fevers are from his sacral decubitus ulcer.  At this point I recommend: 1.  Continuing vancomycin and adding Zosyn.  2.  Wound care consult for his sacral decubitus ulcer. 3.  Nutrition consult. 4.  If his blood cultures turn positive he will likely need TEE to document no evidence of endocarditis.  Thank you for the consult.  We will be glad to follow with you.    ____________________________ Stann Mainlandavid P. Sampson GoonFitzgerald, MD dpf:mc D: 04/17/2014 15:25:24 ET T: 04/17/2014 15:39:55 ET JOB#: [id number]  cc: Stann Mainlandavid P. Sampson GoonFitzgerald, MD, <Dictator> Meggan Dhaliwal Sampson GoonFITZGERALD MD ELECTRONICALLY SIGNED 04/27/2014 19:52

## 2014-06-25 NOTE — H&P (Signed)
PATIENT NAME:  Despina HickWELTY, Linday MR#:  [id number] DATE OF BIRTH:  14-Nov-1960  DATE OF ADMISSION:  04/16/2014  PRIMARY CARE PHYSICIAN: No PCP.   CHIEF COMPLAINT: Fever for 2 days.   HISTORY OF PRESENT ILLNESS: A 54 year old Caucasian female with multiple past medical problems, including history of chronic osteomyelitis, status post right AKA, chronic lower extremity wounds on the left lower extremity and PVD, who presented to the ED with fever for the past 2 days. The patient is alert, awake, oriented, in no acute distress. The patient had a recent prolonged hospitalization last year for left lower extremity wound. The patient was recently discharged after treatment of MRSA bacteremia and sepsis. The patient got a PICC line and has been treated with vancomycin at home for the past 10 days. The patient started to have a fever, 102.6 at home 2 days ago, so he came to the ED for further evaluation. The patient denies any cough, sputum, or shortness of breath. Denies any dysuria, hematuria, or incontinence. The patient said that he has erythema on both lower extremities. His left lower extremity wound is better than before. The patient was suspected to have sepsis and was treated with vancomycin the ED.   PAST MEDICAL HISTORY: Severe  protein-calorie malnutrition, chronic nonhealing ulcers of his left lower extremity, PVD, depression, chronic pain syndrome, hypothyroidism, chronic anemia, sepsis with bacteremia.   PAST SURGICAL HISTORY: Right AKA, extensive bariatric surgery in 2006.   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 The patient has no smoking or drinking or illicit drugs.   FAMILY HISTORY: Mother has diabetes and a stroke.   ALLERGIES: None.   HOME MEDICATIONS: Vancomycin 1500 mg twice a day, Tylenol 325 mg 1 to 2 tablets every 4 hours p.r.n., torsemide 20 mg 1.5 tablets b.i.d., Synthroid 200 mcg p.o. daily, Remeron 15 mg 0.5 tablet once a day, oxycodone 5 mg p.o. every 6  hours p.r.n., methadone 5 mg p.o. 2 tablets every 8 hours, Colace 100 mg p.o. b.i.d.   REVIEW OF SYSTEMS:  CONSTITUTIONAL: The patient has a fever and chills, but no headache, dizziness, or weakness.  EYES: No double vision or blurred vision.  EARS, NOSE, THROAT: No postnasal drip, slurred speech, or dysphagia.  CARDIOVASCULAR: No chest pain, palpitation, orthopnea, or nocturnal dyspnea, but has leg edema.  PULMONARY: No cough, sputum, shortness of breath, or hemoptysis.  GASTROINTESTINAL: No abdominal pain, nausea, vomiting, or diarrhea. No melena or bloody stool.  GENITOURINARY: No dysuria, hematuria, or incontinence.  SKIN: No rash or jaundice.  NEUROLOGIC: No syncope, loss of consciousness, or seizure.  ENDOCRINOLOGY: No polyuria, polydipsia, heat or cold intolerance.  HEMATOLOGY: No easy bruising or bleeding.   PHYSICAL EXAMINATION:  VITAL SIGNS: Temperature 100.6, blood pressure 86/50, pulse 86, oxygen saturation 100% on room air.  GENERAL: The patient is alert, awake, oriented, in no acute distress.  HEENT: Pupils round, equal, reactive to light and accommodation. Dry oral mucosa. Clear oropharynx.  NECK: Supple. No JVD or carotid bruit. No lymphadenopathy. No pain. No thyromegaly.  CARDIOVASCULAR: S1, S2, regular rate and rhythm. No murmurs or gallops.  PULMONARY: Bilateral air entry. No wheezing or rales. No use of accessory muscles to breathe.  ABDOMEN: Soft. No distention. No tenderness. No organomegaly. Bowel sounds present.  EXTREMITIES: Right side AKA. Left lower extremity: Chronic wound. Both lower extremities have erythema and edema. No clubbing or cyanosis. Could not palpate the patient's left pedal pulses due to swelling. There are everal decubitus ulcers  on his sacrum with some drainag. NEUROLOGIC: A and O x 3. No focal deficit.   LABORATORY DATA: Lactic acid 1.1. Glucose 71, BUN 25, creatinine 1.05, sodium 134, potassium 3.3, chloride 101, bicarbonate 27. WBC 3.8,  hemoglobin 8.2, platelets 192,000. Troponin 0.04. Urinalysis is negative.   Chest x-ray: No acute cardiopulmonary process.   IMPRESSIONS:  1.  Sepsis with bilateral leg cellulitis and sacral decubitus ulcer infection.  2.  Fever.  3.  Hypotension.  4.  Hypokalemia.  5.  Dehydration.  6.  Hyponatremia.  7.  Anemia of chronic disease.  8.  Left lower extremity chronic wound and sacral decubitus ulcer.  9.  History of bacteremia on peripherally inserted central catheter with vancomycin.   PLAN OF TREATMENT:  1.  The patient will be admitted to medical floor and we will continue clindamycin. Follow up CBC, blood culture, and we will get an ID consult from Dr. Sampson Goon.  2.  For the left lower extremity chronic wound, we will get a wound care consult.  3.  For hypokalemia and hyponatremia, we will give potassium supplement with IV normal saline.  4.  For hypotension, we will give normal saline IV.  5.  Continue other home medications.  6.  The patient also has a history of MRSA. We will get contact isolation.  7.  I discussed the patient's condition and plan of treatment.   TIME SPENT: About 56 minutes.    ____________________________ Shaune Pollack, MD qc:ts D: 04/16/2014 23:29:58 ET T: 04/16/2014 23:49:57 ET JOB#: [id number]  cc: Shaune Pollack, MD, <Dictator> Shaune Pollack MD ELECTRONICALLY SIGNED 04/17/2014 21:30

## 2014-06-25 NOTE — H&P (Signed)
PATIENT NAME:  [name], [name] MR#:  [id number] DATE OF BIRTH:  05-Nov-1960  DATE OF ADMISSION:  04/03/2014  ADMITTING PHYSICIAN: Gladstone Lighter, MD  PRIMARY CARE PHYSICIAN: The patient does not have a PCP yet as he was just discharged from [facility] 2 weeks ago, but he said he is intending to see Dr. Lavera Guise soon.  CHIEF COMPLAINT: Fever and positive blood cultures.   HISTORY OF PRESENT ILLNESS: Mr. 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 The patient is being followed by a home health nurse. Her has his chronic right AKA and his left leg ulcers, according to him, are looking much better. They have been doing some dressing changes. He has a power-motorized wheelchair at home and has been independent, living by himself with the help of the home health nurse visiting him 3 times a week. Since discharge he has been doing fine up until a couple of days ago when he started having fevers of 100.8 and 101 degrees Fahrenheit. He initially came to the Emergency Room on Saturday, which was April 01, 2014 for fever. Blood work was done. His labs at that time were good so he was discharged after giving a dose of IV antibiotic, according to the patient. His blood cultures from that day are growing gram-positive cocci in pairs and chains. The patient was even in the Emergency Room yesterday for follow-up of his blood cultures and received another dose of antibiotics since his culture results were not available. He was discharged  home. His blood cultures again from April 01, 2014 and also April 02, 2014 are growing gram-positive cocci so he was called to get admitted to the hospital today. He continues to have low-grade fevers and chills at home. No nausea, vomiting, diarrhea, or any other symptoms going on. The patient has a PICC line that has put in at [facility] maybe about 2 months ago for TPN. He states he has malabsorption syndrome since his bariatric surgery and also hypoalbuminemia and they are trying to replenish by TPN so he could have a duodenal stabilizing procedure done for his malabsorption in the near future. The PICC line site looks okay and there is no pain or redness at this time, no other source of infection noted.   PAST MEDICAL HISTORY: 1.  Severe protein calorie malnutrition. 2.  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  3.  Peripheral vascular disease.  4.  Depression.  5.  Chronic pain syndrome.  6.  Hypothyroidism.  7.  Chronic anemia.   PAST SURGICAL HISTORY:  1.  Right AKA. 2.  Extensive bariatric surgery done in [place] in 2006.   ALLERGIES TO MEDICATIONS: No known drug allergies.   CURRENT HOME MEDICATIONS:  1.  Acetaminophen 650 mg q. 6 hours p.r.n. for pain or fever.  2.  Clinimix sulfite-free amino acids 5% with 25% dextrose TPN solution twice a day.  3.  GlucaGen 1 mg injectable kit for hypoglycemia as needed.  4.  Hydrocortisone 1% cream to effected area every day as needed.  5.  Methadone 5  mg 3 times a day.  6.  NovoLog sliding scale insulin.  7.  Zofran 4 mg q. 6 hours p.r.n. for nausea.  8.  Oxycodone 5 mg q. 6 hours p.r.n. for pain.  9.  Potassium chloride 40 mEq as needed.  10.  Torsemide 20 mg p.o. b.i.d.   SOCIAL HISTORY: The patient has been living at home by himself, has home health. He has a motorized wheelchair. He was just discharged from [facility] 2 weeks ago. No smoking or alcohol use.   FAMILY HISTORY: Mom has diabetes and  had stroke. Does not know anything about his father.  REVIEW OF SYSTEMS: CONSTITUTIONAL: Positive for fever, fatigue and weakness.  EYES: No blurred vision, double vision, inflammation or glaucoma.  ENT: No tinnitus, ear pain, hearing loss, epistaxis or discharge.  RESPIRATORY: No cough, wheeze, hemoptysis, or COPD.  CARDIOVASCULAR: No chest pain, orthopnea, edema, arrhythmia, palpitations, or syncope.  GASTROINTESTINAL: No nausea, vomiting, diarrhea. Positive for chronic abdominal pain. No melena.  GENITOURINARY: No dysuria, hematuria, renal calculus, frequency, or incontinence.  ENDOCRINE: No polyuria, nocturia, thyroid problems, heat or cold intolerance.  HEMATOLOGY: No anemia, easy bruising or bleeding.  SKIN: Positive for chronic lower extremity ulcers.  MUSCULOSKELETAL: Generalized body pains, but no arthritis or gout.  NEUROLOGICAL: No numbness, weakness, CVA, TIA or seizures.  PSYCHIATRIC: No anxiety, insomnia or depression.   PHYSICAL EXAMINATION: VITAL SIGNS: Temperature 98 degrees Fahrenheit, pulse 97, respirations 20, blood pressure 104/66, pulse ox 98% on room air.  GENERAL: Well-built, ill-nourished female lying in bed very sleepy but arousable and oriented, not in any acute distress.  HEENT: Normocephalic, atraumatic. Pupils equal, round, reacting to light. Anicteric sclerae. Extraocular movements intact. Oropharynx is clear without erythema, mass or exudates, in general pale-appearing.  NECK: Supple. No thyromegaly, JVD or carotid bruits. No lymphadenopathy.  LUNGS: Moving air bilaterally. No wheeze or crackles. No use of accessory muscles for breathing.  CARDIOVASCULAR: S1 and S2, regular rate and rhythm. No murmurs, rubs, or gallops.  ABDOMEN: Obese. Multiple scars from his previous bariatric surgery noted. Hypoactive bowel sounds noted. No tenderness just discomfort on palpation noted. EXTREMITIES: Both extremities have 4+ edema, except the right lower extremity is status  post AKA. The wound has healed very well. No alterations noted. Left lower extremity, the lower part of the leg and onto the foot, he has extensive old ulcerations with serous drainage. No pus noticed at that this time.  SKIN: Other than the lesions described above, no other acne or rashes.  LYMPHATICS: No cervical lymphadenopathy.  NEUROLOGIC: Cranial nerves intact. No focal motor or sensory deficits.  PSYCH: The patient is awake, alert and oriented x3.   DIAGNOSTIC DATA: WBC 6.1, hemoglobin 8.2, hematocrit 26.2, platelet count 271,000.   Sodium 136, potassium 3.7, chloride 101, bicarb 27, BUN 39, creatinine 0.96, glucose 112, and calcium 7.7.   ALT 30, AST 38, alk phos 135, total bili 0.3 and albumin 2. Magnesium 1.7.   His blood cultures from April 01, 2014 and April 02, 2014 are growing gram-positive cocci in pairs and chains.   ASSESSMENT AND PLAN: A 54 year old female with chronic pain syndrome status post gastric bypass, on TPN for malabsorption and hypoalbuminemia, peripheral vascular disease status post right AKA, and chronic left leg ulcerations was sent in secondary to fevers and positive blood cultures. 1.  Sepsis and bacteremia. Has had a PICC line for greater than 2 months now for TPN. Could have been the source. Also has chronic left lower leg  ulcerations. Remove the PICC line. Get a chest x-ray to rule out any pneumonia and also check urinalysis. Blood cultures have been again drawn. Follow up on the culture final results from the ER visits, yesterday and the day before. Broad-spectrum antibiotics with vancomycin and Zosyn and infectious disease has been consulted.  2.  Malabsorption syndrome with hypoalbuminemia. Started on TPN at [facility] 2 months ago. Will continue that. Will get a dietician consult. We might need a PICC line once cultures are negative. He will need some duodenal pancreaticobiliary diversion procedure as a follow-up from his previous bariatric  surgery.   3.  Chronic left leg ulceration. Seen in October 2015. Vascular recommended AKA at the time, but the patient refused, had a second opinion at [facility] and now the wounds are healing, no rest pain, so will continue dressing changes as per wound care consult.  4.  Chronic pain syndrome. Continue home pain medications.  5.  Care management consult for discharge planning. Currently has home health, but as mentioned above, was at [facility] and [facility] for a prolonged course prior to that. Resume home health services likely at discharge.  6.  Deep vein thrombosis prophylaxis with subcutaneous heparin.   CODE STATUS: FULL code.   TIME SPENT ON ADMISSION: 50 minutes. ____________________________ Gladstone Lighter, MD rk:sb D: 04/03/2014 16:19:54 ET T: 04/03/2014 17:03:11 ET JOB#: [id number]  cc: Gladstone Lighter, MD, <Dictator> Cletis Athens, MD Gladstone Lighter MD ELECTRONICALLY SIGNED 04/03/2014 18:39

## 2015-01-17 ENCOUNTER — Ambulatory Visit (INDEPENDENT_AMBULATORY_CARE_PROVIDER_SITE_OTHER): Payer: 59 | Admitting: Family Medicine

## 2015-01-17 ENCOUNTER — Other Ambulatory Visit: Payer: Self-pay | Admitting: Family Medicine

## 2015-01-17 ENCOUNTER — Encounter: Payer: Self-pay | Admitting: Family Medicine

## 2015-01-17 VITALS — BP 110/62 | HR 83 | Temp 98.6°F | Ht 70.0 in | Wt 228.8 lb

## 2015-01-17 DIAGNOSIS — E559 Vitamin D deficiency, unspecified: Secondary | ICD-10-CM

## 2015-01-17 DIAGNOSIS — E039 Hypothyroidism, unspecified: Secondary | ICD-10-CM | POA: Diagnosis not present

## 2015-01-17 DIAGNOSIS — Z89521 Acquired absence of right knee: Secondary | ICD-10-CM | POA: Diagnosis not present

## 2015-01-17 DIAGNOSIS — I83029 Varicose veins of left lower extremity with ulcer of unspecified site: Secondary | ICD-10-CM

## 2015-01-17 DIAGNOSIS — D509 Iron deficiency anemia, unspecified: Secondary | ICD-10-CM

## 2015-01-17 DIAGNOSIS — Z9884 Bariatric surgery status: Secondary | ICD-10-CM | POA: Diagnosis not present

## 2015-01-17 DIAGNOSIS — Z9889 Other specified postprocedural states: Secondary | ICD-10-CM | POA: Insufficient documentation

## 2015-01-17 DIAGNOSIS — E119 Type 2 diabetes mellitus without complications: Secondary | ICD-10-CM | POA: Insufficient documentation

## 2015-01-17 DIAGNOSIS — Z89511 Acquired absence of right leg below knee: Secondary | ICD-10-CM

## 2015-01-17 DIAGNOSIS — G8929 Other chronic pain: Secondary | ICD-10-CM

## 2015-01-17 DIAGNOSIS — E66811 Obesity, class 1: Secondary | ICD-10-CM

## 2015-01-17 DIAGNOSIS — E669 Obesity, unspecified: Secondary | ICD-10-CM

## 2015-01-17 DIAGNOSIS — L97929 Non-pressure chronic ulcer of unspecified part of left lower leg with unspecified severity: Secondary | ICD-10-CM

## 2015-01-17 DIAGNOSIS — E118 Type 2 diabetes mellitus with unspecified complications: Secondary | ICD-10-CM | POA: Diagnosis not present

## 2015-01-17 LAB — COMPREHENSIVE METABOLIC PANEL
ALK PHOS: 286 U/L — AB (ref 39–117)
ALT: 22 U/L (ref 0–53)
AST: 21 U/L (ref 0–37)
Albumin: 3.2 g/dL — ABNORMAL LOW (ref 3.5–5.2)
BILIRUBIN TOTAL: 0.5 mg/dL (ref 0.2–1.2)
BUN: 20 mg/dL (ref 6–23)
CALCIUM: 7.9 mg/dL — AB (ref 8.4–10.5)
CO2: 27 meq/L (ref 19–32)
CREATININE: 0.9 mg/dL (ref 0.40–1.50)
Chloride: 106 mEq/L (ref 96–112)
GFR: 93.75 mL/min (ref >60.00)
GLUCOSE: 120 mg/dL — AB (ref 70–99)
Potassium: 4.4 mEq/L (ref 3.5–5.1)
Sodium: 140 mEq/L (ref 135–145)
TOTAL PROTEIN: 6.5 g/dL (ref 6.0–8.3)

## 2015-01-17 LAB — TSH: TSH: 3.75 u[IU]/mL (ref 0.35–4.50)

## 2015-01-17 LAB — CBC
HCT: 31.1 % — ABNORMAL LOW (ref 39.0–52.0)
HEMOGLOBIN: 9.9 g/dL — AB (ref 13.0–17.0)
MCHC: 32 g/dL (ref 30.0–36.0)
MCV: 96.7 fl (ref 78.0–100.0)
PLATELETS: 240 10*3/uL (ref 150.0–400.0)
RBC: 3.22 Mil/uL — ABNORMAL LOW (ref 4.22–5.81)
RDW: 15.7 % — AB (ref 11.5–15.5)
WBC: 8.7 10*3/uL (ref 4.0–10.5)

## 2015-01-17 LAB — HEMOGLOBIN A1C: Hgb A1c MFr Bld: 4.2 % — ABNORMAL LOW (ref 4.6–6.5)

## 2015-01-17 LAB — LIPID PANEL
CHOL/HDL RATIO: 2
Cholesterol: 72 mg/dL (ref 0–200)
HDL: 38.4 mg/dL — ABNORMAL LOW (ref >39.00)
LDL Cholesterol: 24 mg/dL (ref 0–99)
NONHDL: 33.78
Triglycerides: 47 mg/dL (ref 0.0–149.0)
VLDL: 9.4 mg/dL (ref 0.0–40.0)

## 2015-01-17 LAB — ALBUMIN: ALBUMIN: 3.2 g/dL — AB (ref 3.5–5.2)

## 2015-01-17 LAB — VITAMIN B12: VITAMIN B 12: 1266 pg/mL — AB (ref 211–911)

## 2015-01-17 LAB — FOLATE: Folate: >23.8 ng/mL (ref >5.9)

## 2015-01-17 LAB — FERRITIN: FERRITIN: 417.7 ng/mL — AB (ref 22.0–322.0)

## 2015-01-17 LAB — IRON: IRON: 50 ug/dL (ref 42–165)

## 2015-01-17 LAB — VITAMIN D 25 HYDROXY (VIT D DEFICIENCY, FRACTURES): VITD: 1.76 ng/mL — ABNORMAL LOW (ref 30.00–100.00)

## 2015-01-17 MED ORDER — VITAMIN D (ERGOCALCIFEROL) 1.25 MG (50000 UNIT) PO CAPS: 50000.0000 [IU] | ORAL_CAPSULE | Freq: Every day | ORAL | Status: DC

## 2015-01-17 NOTE — Assessment & Plan Note (Signed)
Checking A1C today. Needs foot exam and eye exam. Will address at next visit.

## 2015-01-17 NOTE — Patient Instructions (Addendum)
It was nice to see you today.  We will call with your lab results.  We will set up your appt with wound care as well as pain management (if you would like [facility].  Follow up:  Return in about 3 months (around 04/19/2015).  Take care  Dr. Adriana Simasook

## 2015-01-17 NOTE — Assessment & Plan Note (Addendum)
3 superficial venous stasis ulcers noted on exam today. Patient with limited mobility and high risk for poor wound healing. Sending to wound care.

## 2015-01-17 NOTE — Assessment & Plan Note (Signed)
Checking labs including vitamin d today.

## 2015-01-17 NOTE — Assessment & Plan Note (Signed)
TSH today 

## 2015-01-17 NOTE — Assessment & Plan Note (Signed)
Referring to pain management. I do not prescribe chronic narcotics.

## 2015-01-17 NOTE — Assessment & Plan Note (Signed)
Unsure of status. Checking labs today.

## 2015-01-17 NOTE — Assessment & Plan Note (Signed)
Labs today. See orders.

## 2015-01-17 NOTE — Progress Notes (Signed)
Subjective:  Patient ID: [name], female    DOB: 1960/04/09  Age: 54 y.o. MRN: [id number]  CC: Establish care  HPI [name] is a 54 y.o. female presents to the clinic today to establish care.  He has a complicated PMH. Previously with diabetes, hypertension, OSA, metabolic syndrome from morbid obesity. He had bariatric surgery and did well postoperatively. He had subsequent plastic surgery as well. In 2015 he had septic shock from a right foot wound and eventually lost his leg.  Current issues:  1) Chronic pain  Patient has chronic abdominal pain.  He's currently being treated with MS Contin and oxycodone.  He reports he is stable on this.   2) Venous stasis ulcerations  Review of the R medical record reveals that he has had issues with venous stasis in the past.  He is not currently being followed by wound care although he has been previously.  He denies any current wounds.  3) s/p Bariatric surgery, history of vitamin D deficiency, history of vitamin A deficiency  Patient in need of labs today.  4) DM-2  Review of electronic medical record reveals that he has had diabetes in the past but this essentially resolved following bariatric surgery.  Additionally, the chart does reflect that he's had complications from this (polyneuropathy).  No current treatment as his A1C have been below 6.5 per his report.  PMH, Surgical Hx, Family Hx, Social History reviewed and updated as below.  Past Medical History  Diagnosis Date  . Chicken pox   . Depression   . DM type 2 (diabetes mellitus, type 2) ([place]) 2006    Essentially resolved following bariatric surgery  . Venous stasis   . HTN (hypertension)     History of; resolved following bariatric surgery  . History of sleep apnea   . History of malnutrition   . History of vitamin A deficiency    Past Surgical History  Procedure Laterality Date  . Cholecystectomy  1991  . Appendectomy  1991  . Tonsillectomy and  adenoidectomy  1967  . Severe malnutrition  2014  . Below knee leg amputation Right 2014  . Hernia repair  2009  . Gastric sleeve, biliopancreatic diversion, duodenal switch  2006   Family History  Problem Relation Age of Onset  . Arthritis Mother   . Diabetes Mother    Social History  Substance Use Topics  . Smoking status: Never Smoker   . Smokeless tobacco: Never Used  . Alcohol Use: No   Review of Systems Complete ROS was performed today and was negative. See scanned sheet.  Objective:   Today's Vitals: BP 110/62 mmHg  Pulse 83  Temp(Src) 98.6 F (37 C) (Oral)  Ht _0  (1.778 m)  Wt 228 lb 12 oz (103.76 kg)  BMI 32.82 kg/m2  SpO2 96%  Physical Exam  Constitutional: He is oriented to person, place, and time. He appears well-developed. No distress.  Sitting in motorized wheelchair.  HENT:  Head: Normocephalic and atraumatic.  Mouth/Throat: No oropharyngeal exudate.  Eyes: Conjunctivae are normal.  Neck: Neck supple.  Cardiovascular: Normal rate and regular rhythm.   No murmur heard. Pulmonary/Chest: Effort normal and breath sounds normal. No respiratory distress. He has no wheezes. He has no rales.  Abdominal: Soft. He exhibits no distension. There is no tenderness. There is no rebound and no guarding.  Musculoskeletal:  Right BKA noted.  Left ankle - decreased ROM in extension & flexion. Appears that patient has a mild contracture.  Neurological: He is alert and oriented to person, place, and time.  Skin:  3 superficial venous stasis ulcerations with drainage noted on the left lower extremity. Severely dry skin noted on the left lower extremity. Skin is quite hardened and thick. No ulcerations noted on the feet.  Psychiatric: He has a normal mood and affect.  Vitals reviewed.  Assessment & Plan:   Problem List Items Addressed This Visit    Vitamin D deficiency    Checking labs including vitamin d today.       Relevant Orders   Vitamin D (25 hydroxy)    Venous stasis ulcer of left lower extremity ([place])    3 superficial venous stasis ulcers noted on exam today. Patient with limited mobility and high risk for poor wound healing. Sending to wound care.      Relevant Orders   AMB referral to wound care center   S/P bariatric surgery    Labs today. See orders.       Relevant Orders   CBC   Iron   Ferritin   B12   Vitamin B1   Folate   Vitamin A   PTH, intact (no Ca)   Prealbumin   Albumin   Hypothyroidism    TSH today.       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 Needs foot exam and eye exam. Will address at next visit.      Relevant Orders   HgB A1c   Chronic pain    Referring to pain management. I do not prescribe chronic narcotics.      Relevant Medications   DULoxetine (CYMBALTA) 20 MG capsule   mirtazapine (REMERON) 7.5 MG tablet   Oxycodone HCl 10 MG TABS   citalopram (CELEXA) 10 MG tablet   morphine (MS CONTIN) 30 MG 12 hr tablet   Other Relevant Orders   Ambulatory referral to Pain Clinic   Anemia, iron deficiency    Unsure of status. Checking labs today.       Relevant Medications   folic acid (FOLVITE) 1 MG tablet   ferrous sulfate 325 (65 FE) MG EC tablet   Other Relevant Orders   CBC    Other Visit Diagnoses    Obesity (BMI 30.0-34.9)        Relevant Orders    Comp Met (CMET)    Lipid panel      Outpatient Encounter Prescriptions as of 01/17/2015  Medication Sig  . CALCIUM-VITAMIN D PO Take by mouth.  . citalopram (CELEXA) 10 MG tablet 1 mg.  . DULoxetine (CYMBALTA) 20 MG capsule   . ferrous sulfate 325 (65 FE) MG EC tablet Take by mouth.  . folic acid (FOLVITE) 1 MG tablet   . levothyroxine (SYNTHROID, LEVOTHROID) 200 MCG tablet   . mirtazapine (REMERON) 7.5 MG tablet   . morphine (MS  CONTIN) 30 MG 12 hr tablet Take 20 mg by mouth.  . Multiple Vitamins-Minerals (MULTIVITAMIN ADULT PO) Take by mouth.  . Oxycodone HCl 10 MG TABS   . torsemide (DEMADEX) 20 MG tablet   . pantoprazole (PROTONIX) 40 MG tablet   . [DISCONTINUED] morphine (MS CONTIN) 15 MG 12 hr tablet    No facility-administered encounter medications on file as of 01/17/2015.    Follow-up:  Return in about 3 months (around 04/19/2015).   [place]

## 2015-01-17 NOTE — Progress Notes (Signed)
Pre visit review using our clinic review tool, if applicable. No additional management support is needed unless otherwise documented below in the visit note. 

## 2015-01-21 LAB — VITAMIN A

## 2015-01-22 ENCOUNTER — Telehealth: Payer: Self-pay | Admitting: Family Medicine

## 2015-01-22 LAB — VITAMIN B1: Vitamin B1 (Thiamine): 12 nmol/L (ref 8–30)

## 2015-01-22 LAB — PARATHYROID HORMONE, INTACT (NO CA): PTH: 246 pg/mL — AB (ref 14–64)

## 2015-01-22 NOTE — Telephone Encounter (Signed)
Patient was called and given lab results. 

## 2015-01-24 LAB — PREALBUMIN: PREALBUMIN: 9 mg/dL — AB (ref 21–43)

## 2015-01-31 ENCOUNTER — Other Ambulatory Visit: Payer: Self-pay | Admitting: Family Medicine

## 2015-01-31 ENCOUNTER — Telehealth: Payer: Self-pay | Admitting: *Deleted

## 2015-01-31 ENCOUNTER — Telehealth: Payer: Self-pay

## 2015-01-31 MED ORDER — MORPHINE SULFATE ER 30 MG PO TBCR: 30.0000 mg | EXTENDED_RELEASE_TABLET | Freq: Two times a day (BID) | ORAL | Status: DC

## 2015-01-31 MED ORDER — OXYCODONE HCL 10 MG PO TABS: 10.0000 mg | ORAL_TABLET | Freq: Three times a day (TID) | ORAL | Status: DC | PRN

## 2015-01-31 MED ORDER — MORPHINE SULFATE ER 10 MG PO CP24: 10.0000 mg | ORAL_CAPSULE | Freq: Every day | ORAL | Status: DC

## 2015-01-31 NOTE — Telephone Encounter (Signed)
Patient had another problem with prescription he wanted it changed for the third time and the medication was correct just written for once a day and not twice. Dr.Cook stated that he would not write it for twice a day due to it being a 24 hr medication. Patient was not happy and was nervous about medication not going the complete 24 hours due to past surgery. i told him that if that did happen give us a call.

## 2015-01-31 NOTE — Telephone Encounter (Signed)
Patient is stating that when he was taken of his fluid pill his arms and legs have started swelling. He stated his leg appliance is not fitting as well. He told me that he was going back on his fluid medication as of today.

## 2015-01-31 NOTE — Telephone Encounter (Signed)
Pt states his Oxycodone is supposed to be for 10mg  every 6 hours.#120   Morphine should be for #60  I have discuss this with Dr.Cooke he told me to let you follow up with him

## 2015-01-31 NOTE — Telephone Encounter (Signed)
Refilled for 30 days. I do not prescribe chronic narcotics. He will need to see pain management. Referral placed.

## 2015-01-31 NOTE — Telephone Encounter (Signed)
Patient requested Rx refill for oxycodone, and morphine

## 2015-01-31 NOTE — Telephone Encounter (Signed)
Patient was called and told we would only be given the oxycodone 30 days worth. Dr.Cook will change the morphine prescription and only giving 30 day supply. We stated no more will be given after his prescriptions runs out.  A referral as been sent for pain management.

## 2015-01-31 NOTE — Telephone Encounter (Signed)
He should exhibit caution with this.

## 2015-02-05 ENCOUNTER — Other Ambulatory Visit: Payer: 59

## 2015-02-07 ENCOUNTER — Other Ambulatory Visit: Payer: Self-pay | Admitting: Family Medicine

## 2015-02-07 ENCOUNTER — Telehealth: Payer: Self-pay | Admitting: *Deleted

## 2015-02-07 ENCOUNTER — Other Ambulatory Visit (INDEPENDENT_AMBULATORY_CARE_PROVIDER_SITE_OTHER): Payer: 59

## 2015-02-07 DIAGNOSIS — E559 Vitamin D deficiency, unspecified: Secondary | ICD-10-CM | POA: Diagnosis not present

## 2015-02-07 DIAGNOSIS — E509 Vitamin A deficiency, unspecified: Secondary | ICD-10-CM

## 2015-02-07 LAB — VITAMIN D 25 HYDROXY (VIT D DEFICIENCY, FRACTURES): VITD: 2.04 ng/mL — AB (ref 30.00–100.00)

## 2015-02-07 NOTE — Telephone Encounter (Signed)
Labs and dx?  

## 2015-02-07 NOTE — Telephone Encounter (Signed)
Put orders in

## 2015-02-13 LAB — VITAMIN A: Vitamin A (Retinoic Acid): 14 ug/dL — ABNORMAL LOW (ref 38–98)

## 2015-02-15 ENCOUNTER — Other Ambulatory Visit: Payer: 59

## 2015-02-15 ENCOUNTER — Telehealth: Payer: Self-pay | Admitting: *Deleted

## 2015-02-15 NOTE — Telephone Encounter (Signed)
Labs and dx?  

## 2015-04-05 ENCOUNTER — Other Ambulatory Visit: Payer: Self-pay | Admitting: Family Medicine

## 2015-04-05 NOTE — Telephone Encounter (Signed)
Please advise? Its historical TSH in November was 3.75

## 2015-04-12 ENCOUNTER — Telehealth: Payer: Self-pay | Admitting: Family Medicine

## 2015-04-12 NOTE — Telephone Encounter (Signed)
No need for labs. I have already placed referral to wound center. Was this arranged?

## 2015-04-12 NOTE — Telephone Encounter (Signed)
LAst labs with a Cr was in November, please advise if he can have new ones drawn? thanks

## 2015-04-12 NOTE — Telephone Encounter (Signed)
According to the referral [name] canceled this. When I called the wound clinic she answered and did not know why it was cxled since there wasn't any notes as to why. I opened the referral back up and she will call him today.

## 2015-04-12 NOTE — Telephone Encounter (Signed)
Pt called wanting to know his creatinine level? If no level in chart pt would like to get it check. Pt also needs a referral for wound care in [place]. Call pt @ [phone]. Thank you!

## 2015-04-12 NOTE — Telephone Encounter (Signed)
Melissa please advise. Thanks

## 2015-04-17 ENCOUNTER — Ambulatory Visit: Payer: 59 | Admitting: Internal Medicine

## 2015-04-19 ENCOUNTER — Ambulatory Visit: Payer: 59 | Admitting: Family Medicine

## 2015-04-23 ENCOUNTER — Ambulatory Visit: Payer: 59 | Admitting: Surgery

## 2015-04-27 ENCOUNTER — Inpatient Hospital Stay (HOSPITAL_COMMUNITY)
Admission: EM | Admit: 2015-04-27 | Discharge: 2015-05-01 | DRG: 728 | Disposition: A | Discharge status: Skilled Nursing Facility | Payer: 59 | Attending: Internal Medicine | Admitting: Internal Medicine

## 2015-04-27 ENCOUNTER — Encounter (HOSPITAL_COMMUNITY): Payer: Self-pay | Admitting: *Deleted

## 2015-04-27 ENCOUNTER — Emergency Department (HOSPITAL_COMMUNITY): Payer: 59

## 2015-04-27 DIAGNOSIS — R238 Other skin changes: Secondary | ICD-10-CM

## 2015-04-27 DIAGNOSIS — E11622 Type 2 diabetes mellitus with other skin ulcer: Secondary | ICD-10-CM | POA: Diagnosis present

## 2015-04-27 DIAGNOSIS — Z89519 Acquired absence of unspecified leg below knee: Secondary | ICD-10-CM

## 2015-04-27 DIAGNOSIS — R29898 Other symptoms and signs involving the musculoskeletal system: Secondary | ICD-10-CM

## 2015-04-27 DIAGNOSIS — G894 Chronic pain syndrome: Secondary | ICD-10-CM | POA: Insufficient documentation

## 2015-04-27 DIAGNOSIS — W19XXXA Unspecified fall, initial encounter: Secondary | ICD-10-CM | POA: Diagnosis present

## 2015-04-27 DIAGNOSIS — S62604A Fracture of unspecified phalanx of right ring finger, initial encounter for closed fracture: Secondary | ICD-10-CM | POA: Diagnosis present

## 2015-04-27 DIAGNOSIS — D649 Anemia, unspecified: Secondary | ICD-10-CM | POA: Diagnosis present

## 2015-04-27 DIAGNOSIS — L89300 Pressure ulcer of unspecified buttock, unstageable: Secondary | ICD-10-CM | POA: Diagnosis present

## 2015-04-27 DIAGNOSIS — Z833 Family history of diabetes mellitus: Secondary | ICD-10-CM

## 2015-04-27 DIAGNOSIS — G8929 Other chronic pain: Secondary | ICD-10-CM | POA: Diagnosis present

## 2015-04-27 DIAGNOSIS — N492 Inflammatory disorders of scrotum: Secondary | ICD-10-CM | POA: Insufficient documentation

## 2015-04-27 DIAGNOSIS — F329 Major depressive disorder, single episode, unspecified: Secondary | ICD-10-CM | POA: Diagnosis present

## 2015-04-27 DIAGNOSIS — Z9884 Bariatric surgery status: Secondary | ICD-10-CM

## 2015-04-27 DIAGNOSIS — R609 Edema, unspecified: Secondary | ICD-10-CM | POA: Insufficient documentation

## 2015-04-27 DIAGNOSIS — N5089 Other specified disorders of the male genital organs: Secondary | ICD-10-CM | POA: Diagnosis present

## 2015-04-27 DIAGNOSIS — I83009 Varicose veins of unspecified lower extremity with ulcer of unspecified site: Secondary | ICD-10-CM | POA: Insufficient documentation

## 2015-04-27 DIAGNOSIS — I83029 Varicose veins of left lower extremity with ulcer of unspecified site: Secondary | ICD-10-CM | POA: Diagnosis present

## 2015-04-27 DIAGNOSIS — Z8261 Family history of arthritis: Secondary | ICD-10-CM

## 2015-04-27 DIAGNOSIS — S62609A Fracture of unspecified phalanx of unspecified finger, initial encounter for closed fracture: Secondary | ICD-10-CM | POA: Insufficient documentation

## 2015-04-27 DIAGNOSIS — L909 Atrophic disorder of skin, unspecified: Secondary | ICD-10-CM

## 2015-04-27 DIAGNOSIS — L0390 Cellulitis, unspecified: Secondary | ICD-10-CM | POA: Diagnosis present

## 2015-04-27 DIAGNOSIS — R52 Pain, unspecified: Secondary | ICD-10-CM

## 2015-04-27 DIAGNOSIS — D509 Iron deficiency anemia, unspecified: Secondary | ICD-10-CM | POA: Diagnosis present

## 2015-04-27 DIAGNOSIS — E876 Hypokalemia: Secondary | ICD-10-CM | POA: Diagnosis present

## 2015-04-27 DIAGNOSIS — B369 Superficial mycosis, unspecified: Secondary | ICD-10-CM | POA: Diagnosis not present

## 2015-04-27 DIAGNOSIS — L97929 Non-pressure chronic ulcer of unspecified part of left lower leg with unspecified severity: Secondary | ICD-10-CM

## 2015-04-27 DIAGNOSIS — R531 Weakness: Secondary | ICD-10-CM | POA: Insufficient documentation

## 2015-04-27 DIAGNOSIS — I10 Essential (primary) hypertension: Secondary | ICD-10-CM | POA: Insufficient documentation

## 2015-04-27 DIAGNOSIS — E46 Unspecified protein-calorie malnutrition: Secondary | ICD-10-CM | POA: Diagnosis present

## 2015-04-27 DIAGNOSIS — E119 Type 2 diabetes mellitus without complications: Secondary | ICD-10-CM

## 2015-04-27 DIAGNOSIS — R627 Adult failure to thrive: Secondary | ICD-10-CM | POA: Insufficient documentation

## 2015-04-27 DIAGNOSIS — E559 Vitamin D deficiency, unspecified: Secondary | ICD-10-CM | POA: Diagnosis present

## 2015-04-27 DIAGNOSIS — E039 Hypothyroidism, unspecified: Secondary | ICD-10-CM | POA: Diagnosis present

## 2015-04-27 DIAGNOSIS — L97909 Non-pressure chronic ulcer of unspecified part of unspecified lower leg with unspecified severity: Secondary | ICD-10-CM

## 2015-04-27 LAB — BASIC METABOLIC PANEL
ANION GAP: 14 (ref 5–15)
BUN: 16 mg/dL (ref 6–20)
CO2: 27 mmol/L (ref 22–32)
Calcium: 8.1 mg/dL — ABNORMAL LOW (ref 8.9–10.3)
Chloride: 98 mmol/L — ABNORMAL LOW (ref 101–111)
Creatinine, Ser: 1.08 mg/dL (ref 0.61–1.24)
Glucose, Bld: 118 mg/dL — ABNORMAL HIGH (ref 65–99)
POTASSIUM: 3.4 mmol/L — AB (ref 3.5–5.1)
SODIUM: 139 mmol/L (ref 135–145)

## 2015-04-27 LAB — CBC
HEMATOCRIT: 32.7 % — AB (ref 39.0–52.0)
HEMOGLOBIN: 10.4 g/dL — AB (ref 13.0–17.0)
MCH: 29.9 pg (ref 26.0–34.0)
MCHC: 31.8 g/dL (ref 30.0–36.0)
MCV: 94 fL (ref 78.0–100.0)
Platelets: 361 10*3/uL (ref 150–400)
RBC: 3.48 MIL/uL — ABNORMAL LOW (ref 4.22–5.81)
RDW: 15.7 % — AB (ref 11.5–15.5)
WBC: 7.9 10*3/uL (ref 4.0–10.5)

## 2015-04-27 LAB — CBG MONITORING, ED: Glucose-Capillary: 101 mg/dL — ABNORMAL HIGH (ref 65–99)

## 2015-04-27 MED ORDER — SODIUM CHLORIDE 0.9 % IV BOLUS (SEPSIS)
1000.0000 mL | Freq: Once | INTRAVENOUS | Status: AC
  Administered 2015-04-27: 1000 mL via INTRAVENOUS

## 2015-04-27 MED ORDER — IOHEXOL 300 MG/ML  SOLN
100.0000 mL | Freq: Once | INTRAMUSCULAR | Status: AC | PRN
  Administered 2015-04-28: 100 mL via INTRAVENOUS

## 2015-04-27 MED ORDER — HYDROMORPHONE HCL 1 MG/ML IJ SOLN
1.0000 mg | Freq: Once | INTRAMUSCULAR | Status: AC
  Administered 2015-04-27: 1 mg via INTRAVENOUS
  Filled 2015-04-27: qty 1

## 2015-04-27 MED ORDER — ONDANSETRON HCL 4 MG/2ML IJ SOLN
4.0000 mg | Freq: Once | INTRAMUSCULAR | Status: AC
  Administered 2015-04-27: 4 mg via INTRAVENOUS
  Filled 2015-04-27: qty 2

## 2015-04-27 NOTE — ED Notes (Signed)
This RN informed Felicie Mornavid Smith NP of pt's BP 97/59 and Onalee HuaDavid gave verbal order to go ahead and give 1mg  of dilaudid with a fluid bolus.

## 2015-04-27 NOTE — ED Provider Notes (Signed)
CSN: [id number]     Arrival date & time 04/27/15  1554 History   First MD Initiated Contact with Patient 04/27/15 2022     Chief Complaint  Patient presents with  . Fall  . Weakness     (Consider location/radiation/quality/duration/timing/severity/associated sxs/prior Treatment) HPI Comments: Patient reports a mechanical fall in a parking lot one week ago. He was unable to get up on his own and needed EMS assistance.  Since that time, he has experienced increasing weakness and bilateral groin pain with scrotal swelling. 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  Over the course of the last week, his groin pain has steadily increased and he is no longer able to ambulate independently. He also reports increasing fluid retention in his legs.  Patient is a 55 y.o. female presenting with fall and weakness. The history is provided by the patient and a caregiver. No language interpreter was used.  Fall This is a new problem. The current episode started 1 to 4 weeks ago. The problem occurs intermittently. The problem has been gradually worsening. Associated symptoms include arthralgias, fatigue and weakness.  Weakness Associated symptoms include arthralgias, fatigue and weakness.    Past Medical History  Diagnosis Date  . Chicken pox   . Depression   . DM type 2 (diabetes mellitus, type 2) (HCC) 2006    Essentially resolved following bariatric surgery  . Venous stasis   . HTN (hypertension)     History of; resolved following bariatric surgery  . History of sleep apnea   . History of malnutrition   . History of vitamin A deficiency    Past Surgical History  Procedure Laterality Date  . Cholecystectomy  1991  . Appendectomy  1991  . Tonsillectomy and adenoidectomy  1967  . Severe malnutrition  2014  . Below knee leg amputation Right 2014  . Hernia repair  2009  . Gastric sleeve, biliopancreatic diversion, duodenal switch  2006   Family History  Problem Relation  Age of Onset  . Arthritis Mother   . Diabetes Mother    Social History  Substance Use Topics  . Smoking status: Never Smoker   . Smokeless tobacco: Never Used  . Alcohol Use: No    Review of Systems  Constitutional: Positive for activity change and fatigue.  Genitourinary: Positive for scrotal swelling.  Musculoskeletal: Positive for arthralgias and gait problem.  Skin: Positive for color change.  Neurological: Positive for weakness.  All other systems reviewed and are negative.     Allergies  Review of patient's allergies indicates no known allergies.  Home Medications   Prior to Admission medications   Medication Sig Start Date End Date Taking? Authorizing Provider  citalopram (CELEXA) 10 MG tablet Take 10 mg by mouth daily.  10/30/11  Yes Historical Provider, MD  docusate sodium (COLACE) 100 MG capsule Take 100 mg by mouth daily.   Yes Historical Provider, MD  DULoxetine (CYMBALTA) 20 MG capsule Take 20 mg by mouth daily.  12/29/14  Yes Historical Provider, MD  ferrous sulfate 325 (65 FE) MG tablet Take 325 mg by mouth 2 (two) times daily.    Yes Historical Provider, MD  folic acid (FOLVITE) 1 MG tablet Take 1 mg by mouth daily.  12/29/14  Yes Historical Provider, MD  levothyroxine (SYNTHROID, LEVOTHROID) 200 MCG tablet TAKE ONE TABLET BY MOUTH DAILY 04/05/15  Yes Tommie Sams, DO  Melatonin 5 MG TABS Take 5 mg by mouth at bedtime.  Yes Historical Provider, MD  mirtazapine (REMERON) 7.5 MG tablet Take 7.5 mg by mouth at bedtime.  12/29/14  Yes Historical Provider, MD  morphine (MS CONTIN) 15 MG 12 hr tablet Take 15 mg by mouth 2 (two) times daily. 04/23/15  Yes Historical Provider, MD  Oxycodone HCl 10 MG TABS Take 1 tablet (10 mg total) by mouth every 8 (eight) hours as needed. Patient taking differently: Take 10 mg by mouth every 8 (eight) hours as needed (for breakthrough pain).  01/31/15  Yes Jayce G Cook, DO  pantoprazole (PROTONIX) 40 MG tablet Take 40 mg by mouth daily.   12/29/14  Yes Historical Provider, MD  torsemide (DEMADEX) 20 MG tablet Take 30 mg by mouth daily. Currently takes 1.5 tabs daily   Yes Historical Provider, MD  Vitamin D, Ergocalciferol, (DRISDOL) 50000 UNITS CAPS capsule Take 1 capsule (50,000 Units total) by mouth daily. Patient taking differently: Take 50,000 Units by mouth every Monday.  01/17/15  Yes Tommie SamsJayce G Cook, DO  levothyroxine (SYNTHROID, LEVOTHROID) 25 MCG tablet Take 25 mcg by mouth daily before breakfast.    Historical Provider, MD   BP 108/80 mmHg  Pulse 85  Temp(Src) 98.4 F (36.9 C) (Oral)  Resp 22  SpO2 94% Physical Exam  Constitutional: He is oriented to person, place, and time. He appears well-developed and well-nourished.  HENT:  Head: Normocephalic and atraumatic.  Eyes: Pupils are equal, round, and reactive to light.  Neck: Normal range of motion. Neck supple.  Cardiovascular: Normal rate and regular rhythm.   Pulmonary/Chest: Effort normal and breath sounds normal.  Abdominal: Bowel sounds are normal. There is no tenderness.  Genitourinary:  Scrotal erythema and edema.  Musculoskeletal: He exhibits edema and tenderness.  Neurological: He is alert and oriented to person, place, and time.  Skin: Skin is warm. There is erythema.  Psychiatric: He has a normal mood and affect.  Nursing note and vitals reviewed.   ED Course  Procedures (including critical care time) Labs Review Labs Reviewed  BASIC METABOLIC PANEL - Abnormal; Notable for the following:    Potassium 3.4 (*)    Chloride 98 (*)    Glucose, Bld 118 (*)    Calcium 8.1 (*)    All other components within normal limits  CBC - Abnormal; Notable for the following:    RBC 3.48 (*)    Hemoglobin 10.4 (*)    HCT 32.7 (*)    RDW 15.7 (*)    All other components within normal limits  CBG MONITORING, ED - Abnormal; Notable for the following:    Glucose-Capillary 101 (*)    All other components within normal limits  URINALYSIS, ROUTINE W REFLEX  MICROSCOPIC (NOT AT [facility])    Imaging Review No results found. I have personally reviewed and evaluated these images and lab results as part of my medical decision-making.   EKG Interpretation None     Patient awaiting completion of CT scan to evaluate for intra-abdominal or pelvic abnormality. Anticipate admission for increasing weakness and functional decline in ability to ambulate independently. Patient discussed with and signed out to Dr. Denton LankSteinl. MDM   Final diagnoses:  None    Weakness. Fall.    Felicie Mornavid Masiah Lewing, NP 04/28/15 1151  Arby BarretteMarcy Pfeiffer, MD 05/06/15 [phone]

## 2015-04-27 NOTE — ED Notes (Signed)
Pt reports fall x 1 week ago while walking. States pain to bilateral legs and side. Pt states still not strong enough to walk at this point. Also reports swelling to scrotal area after fall, but is decreasing in size. Right hand ring finger, unable to bend all the way.

## 2015-04-28 ENCOUNTER — Observation Stay (HOSPITAL_COMMUNITY): Payer: 59

## 2015-04-28 ENCOUNTER — Emergency Department (HOSPITAL_COMMUNITY): Payer: 59

## 2015-04-28 ENCOUNTER — Encounter (HOSPITAL_COMMUNITY): Payer: Self-pay | Admitting: Radiology

## 2015-04-28 DIAGNOSIS — I10 Essential (primary) hypertension: Secondary | ICD-10-CM | POA: Diagnosis present

## 2015-04-28 DIAGNOSIS — D649 Anemia, unspecified: Secondary | ICD-10-CM | POA: Diagnosis not present

## 2015-04-28 DIAGNOSIS — D509 Iron deficiency anemia, unspecified: Secondary | ICD-10-CM | POA: Diagnosis present

## 2015-04-28 DIAGNOSIS — N5089 Other specified disorders of the male genital organs: Secondary | ICD-10-CM | POA: Diagnosis present

## 2015-04-28 DIAGNOSIS — E11622 Type 2 diabetes mellitus with other skin ulcer: Secondary | ICD-10-CM | POA: Diagnosis present

## 2015-04-28 DIAGNOSIS — E039 Hypothyroidism, unspecified: Secondary | ICD-10-CM

## 2015-04-28 DIAGNOSIS — R531 Weakness: Secondary | ICD-10-CM | POA: Insufficient documentation

## 2015-04-28 DIAGNOSIS — E876 Hypokalemia: Secondary | ICD-10-CM

## 2015-04-28 DIAGNOSIS — E46 Unspecified protein-calorie malnutrition: Secondary | ICD-10-CM

## 2015-04-28 DIAGNOSIS — W19XXXA Unspecified fall, initial encounter: Secondary | ICD-10-CM | POA: Diagnosis present

## 2015-04-28 DIAGNOSIS — G894 Chronic pain syndrome: Secondary | ICD-10-CM | POA: Diagnosis present

## 2015-04-28 DIAGNOSIS — B369 Superficial mycosis, unspecified: Secondary | ICD-10-CM | POA: Diagnosis present

## 2015-04-28 DIAGNOSIS — R627 Adult failure to thrive: Secondary | ICD-10-CM

## 2015-04-28 DIAGNOSIS — E559 Vitamin D deficiency, unspecified: Secondary | ICD-10-CM

## 2015-04-28 DIAGNOSIS — L03314 Cellulitis of groin: Secondary | ICD-10-CM | POA: Diagnosis not present

## 2015-04-28 DIAGNOSIS — G8929 Other chronic pain: Secondary | ICD-10-CM

## 2015-04-28 DIAGNOSIS — Z833 Family history of diabetes mellitus: Secondary | ICD-10-CM | POA: Diagnosis not present

## 2015-04-28 DIAGNOSIS — F329 Major depressive disorder, single episode, unspecified: Secondary | ICD-10-CM | POA: Diagnosis present

## 2015-04-28 DIAGNOSIS — L03116 Cellulitis of left lower limb: Secondary | ICD-10-CM | POA: Diagnosis not present

## 2015-04-28 DIAGNOSIS — N492 Inflammatory disorders of scrotum: Secondary | ICD-10-CM | POA: Diagnosis present

## 2015-04-28 DIAGNOSIS — R29898 Other symptoms and signs involving the musculoskeletal system: Secondary | ICD-10-CM | POA: Insufficient documentation

## 2015-04-28 DIAGNOSIS — L0390 Cellulitis, unspecified: Secondary | ICD-10-CM | POA: Diagnosis present

## 2015-04-28 DIAGNOSIS — L89300 Pressure ulcer of unspecified buttock, unstageable: Secondary | ICD-10-CM

## 2015-04-28 DIAGNOSIS — Z9884 Bariatric surgery status: Secondary | ICD-10-CM | POA: Diagnosis not present

## 2015-04-28 DIAGNOSIS — R609 Edema, unspecified: Secondary | ICD-10-CM | POA: Diagnosis present

## 2015-04-28 DIAGNOSIS — Z8261 Family history of arthritis: Secondary | ICD-10-CM | POA: Diagnosis not present

## 2015-04-28 DIAGNOSIS — S62604A Fracture of unspecified phalanx of right ring finger, initial encounter for closed fracture: Secondary | ICD-10-CM | POA: Diagnosis present

## 2015-04-28 LAB — CBC WITH DIFFERENTIAL/PLATELET
BASOS ABS: 0 10*3/uL (ref 0.0–0.1)
BASOS PCT: 0 %
EOS ABS: 0.2 10*3/uL (ref 0.0–0.7)
Eosinophils Relative: 2 %
HCT: 28.1 % — ABNORMAL LOW (ref 39.0–52.0)
HEMOGLOBIN: 8.5 g/dL — AB (ref 13.0–17.0)
Lymphocytes Relative: 17 %
Lymphs Abs: 1.1 10*3/uL (ref 0.7–4.0)
MCH: 28.1 pg (ref 26.0–34.0)
MCHC: 30.2 g/dL (ref 30.0–36.0)
MCV: 93 fL (ref 78.0–100.0)
MONOS PCT: 6 %
Monocytes Absolute: 0.4 10*3/uL (ref 0.1–1.0)
NEUTROS PCT: 75 %
Neutro Abs: 5.2 10*3/uL (ref 1.7–7.7)
Platelets: 308 10*3/uL (ref 150–400)
RBC: 3.02 MIL/uL — ABNORMAL LOW (ref 4.22–5.81)
RDW: 15.9 % — ABNORMAL HIGH (ref 11.5–15.5)
WBC: 6.9 10*3/uL (ref 4.0–10.5)

## 2015-04-28 LAB — I-STAT CG4 LACTIC ACID, ED: LACTIC ACID, VENOUS: 0.83 mmol/L (ref 0.5–2.0)

## 2015-04-28 LAB — URINALYSIS, ROUTINE W REFLEX MICROSCOPIC
BILIRUBIN URINE: NEGATIVE
Glucose, UA: NEGATIVE mg/dL
Hgb urine dipstick: NEGATIVE
KETONES UR: NEGATIVE mg/dL
Leukocytes, UA: NEGATIVE
NITRITE: NEGATIVE
Protein, ur: NEGATIVE mg/dL
Specific Gravity, Urine: 1.01 (ref 1.005–1.030)
pH: 5.5 (ref 5.0–8.0)

## 2015-04-28 LAB — VITAMIN B12: VITAMIN B 12: 1413 pg/mL — AB (ref 180–914)

## 2015-04-28 LAB — COMPREHENSIVE METABOLIC PANEL
ALT: 15 U/L — ABNORMAL LOW (ref 17–63)
ANION GAP: 9 (ref 5–15)
AST: 20 U/L (ref 15–41)
Albumin: 2.2 g/dL — ABNORMAL LOW (ref 3.5–5.0)
Alkaline Phosphatase: 213 U/L — ABNORMAL HIGH (ref 38–126)
BILIRUBIN TOTAL: 0.7 mg/dL (ref 0.3–1.2)
BUN: 13 mg/dL (ref 6–20)
CHLORIDE: 103 mmol/L (ref 101–111)
CO2: 27 mmol/L (ref 22–32)
Calcium: 7.3 mg/dL — ABNORMAL LOW (ref 8.9–10.3)
Creatinine, Ser: 0.82 mg/dL (ref 0.61–1.24)
Glucose, Bld: 133 mg/dL — ABNORMAL HIGH (ref 65–99)
POTASSIUM: 3.2 mmol/L — AB (ref 3.5–5.1)
Sodium: 139 mmol/L (ref 135–145)
TOTAL PROTEIN: 5.6 g/dL — AB (ref 6.5–8.1)

## 2015-04-28 LAB — IRON AND TIBC: IRON: 23 ug/dL — AB (ref 45–182)

## 2015-04-28 LAB — CK: Total CK: 87 U/L (ref 49–397)

## 2015-04-28 LAB — FERRITIN: Ferritin: 429 ng/mL — ABNORMAL HIGH (ref 24–336)

## 2015-04-28 LAB — PHOSPHORUS: PHOSPHORUS: 2.2 mg/dL — AB (ref 2.5–4.6)

## 2015-04-28 LAB — FOLATE: FOLATE: 29.1 ng/mL (ref >5.9)

## 2015-04-28 LAB — PREALBUMIN: PREALBUMIN: 2.6 mg/dL — AB (ref 18–38)

## 2015-04-28 LAB — MAGNESIUM: MAGNESIUM: 1.6 mg/dL — AB (ref 1.7–2.4)

## 2015-04-28 MED ORDER — CALCIUM CARBONATE-VITAMIN D 500-200 MG-UNIT PO TABS
2.0000 | ORAL_TABLET | Freq: Three times a day (TID) | ORAL | Status: DC
  Administered 2015-04-28 – 2015-05-01 (×15): 2 via ORAL
  Filled 2015-04-28 (×3): qty 2
  Filled 2015-04-28: qty 1
  Filled 2015-04-28 (×4): qty 2
  Filled 2015-04-28: qty 1
  Filled 2015-04-28 (×7): qty 2

## 2015-04-28 MED ORDER — POTASSIUM CHLORIDE CRYS ER 20 MEQ PO TBCR
40.0000 meq | EXTENDED_RELEASE_TABLET | Freq: Once | ORAL | Status: AC
  Administered 2015-04-28: 40 meq via ORAL
  Filled 2015-04-28: qty 2

## 2015-04-28 MED ORDER — ONDANSETRON HCL 4 MG PO TABS: 4.0000 mg | ORAL_TABLET | Freq: Four times a day (QID) | ORAL | Status: DC | PRN

## 2015-04-28 MED ORDER — CLOTRIMAZOLE 1 % EX CREA
TOPICAL_CREAM | Freq: Once | CUTANEOUS | Status: AC
  Administered 2015-04-28: 1 via TOPICAL
  Filled 2015-04-28: qty 15

## 2015-04-28 MED ORDER — DOCUSATE SODIUM 100 MG PO CAPS
100.0000 mg | ORAL_CAPSULE | Freq: Every day | ORAL | Status: DC
  Administered 2015-04-28 – 2015-05-01 (×3): 100 mg via ORAL
  Filled 2015-04-28 (×4): qty 1

## 2015-04-28 MED ORDER — ENOXAPARIN SODIUM 40 MG/0.4ML ~~LOC~~ SOLN
40.0000 mg | SUBCUTANEOUS | Status: DC
  Administered 2015-04-28 – 2015-05-01 (×4): 40 mg via SUBCUTANEOUS
  Filled 2015-04-28 (×4): qty 0.4

## 2015-04-28 MED ORDER — OXYCODONE HCL 5 MG PO TABS
10.0000 mg | ORAL_TABLET | ORAL | Status: DC | PRN
  Administered 2015-04-29 – 2015-05-01 (×11): 10 mg via ORAL
  Filled 2015-04-28 (×11): qty 2

## 2015-04-28 MED ORDER — TORSEMIDE 20 MG PO TABS
30.0000 mg | ORAL_TABLET | Freq: Every day | ORAL | Status: DC
  Administered 2015-04-28 – 2015-04-30 (×3): 30 mg via ORAL
  Filled 2015-04-28 (×3): qty 2

## 2015-04-28 MED ORDER — POTASSIUM CHLORIDE CRYS ER 20 MEQ PO TBCR
40.0000 meq | EXTENDED_RELEASE_TABLET | Freq: Once | ORAL | Status: AC
Start: 2015-04-28 — End: 2015-04-28
  Administered 2015-04-28: 40 meq via ORAL
  Filled 2015-04-28: qty 2

## 2015-04-28 MED ORDER — ALUM & MAG HYDROXIDE-SIMETH 200-200-20 MG/5ML PO SUSP: 30.0000 mL | Freq: Four times a day (QID) | ORAL | Status: DC | PRN

## 2015-04-28 MED ORDER — ACETAMINOPHEN 325 MG PO TABS: 650.0000 mg | ORAL_TABLET | Freq: Four times a day (QID) | ORAL | Status: DC | PRN

## 2015-04-28 MED ORDER — VITAMIN D (ERGOCALCIFEROL) 1.25 MG (50000 UNIT) PO CAPS
50000.0000 [IU] | ORAL_CAPSULE | ORAL | Status: DC
  Administered 2015-04-30: 50000 [IU] via ORAL
  Filled 2015-04-28 (×2): qty 1

## 2015-04-28 MED ORDER — MELATONIN 3 MG PO TABS
6.0000 mg | ORAL_TABLET | Freq: Every day | ORAL | Status: DC
  Administered 2015-04-28 – 2015-04-30 (×3): 6 mg via ORAL
  Filled 2015-04-28 (×4): qty 2

## 2015-04-28 MED ORDER — FERROUS SULFATE 325 (65 FE) MG PO TABS
325.0000 mg | ORAL_TABLET | Freq: Two times a day (BID) | ORAL | Status: DC
  Administered 2015-04-28 – 2015-05-01 (×8): 325 mg via ORAL
  Filled 2015-04-28 (×8): qty 1

## 2015-04-28 MED ORDER — ONDANSETRON HCL 4 MG/2ML IJ SOLN
4.0000 mg | Freq: Four times a day (QID) | INTRAMUSCULAR | Status: DC | PRN
Start: 2015-04-28 — End: 2015-05-02

## 2015-04-28 MED ORDER — SORBITOL 70 % SOLN
30.0000 mL | Freq: Every day | Status: DC | PRN
  Filled 2015-04-28: qty 30

## 2015-04-28 MED ORDER — CEFAZOLIN SODIUM-DEXTROSE 2-3 GM-% IV SOLR
2.0000 g | Freq: Three times a day (TID) | INTRAVENOUS | Status: DC
  Administered 2015-04-28 – 2015-04-30 (×7): 2 g via INTRAVENOUS
  Filled 2015-04-28 (×10): qty 50

## 2015-04-28 MED ORDER — SODIUM CHLORIDE 0.9 % IV SOLN
INTRAVENOUS | Status: DC
  Administered 2015-04-28: 06:00:00 via INTRAVENOUS

## 2015-04-28 MED ORDER — CEFAZOLIN SODIUM 1-5 GM-% IV SOLN
1.0000 g | Freq: Once | INTRAVENOUS | Status: AC
  Administered 2015-04-28: 1 g via INTRAVENOUS
  Filled 2015-04-28: qty 50

## 2015-04-28 MED ORDER — MORPHINE SULFATE ER 15 MG PO TBCR
15.0000 mg | EXTENDED_RELEASE_TABLET | Freq: Two times a day (BID) | ORAL | Status: DC
  Administered 2015-04-28 – 2015-05-01 (×7): 15 mg via ORAL
  Filled 2015-04-28 (×7): qty 1

## 2015-04-28 MED ORDER — DULOXETINE HCL 20 MG PO CPEP
20.0000 mg | ORAL_CAPSULE | Freq: Every day | ORAL | Status: DC
  Administered 2015-04-28 – 2015-05-01 (×4): 20 mg via ORAL
  Filled 2015-04-28 (×6): qty 1

## 2015-04-28 MED ORDER — FLEET ENEMA 7-19 GM/118ML RE ENEM: 1.0000 | ENEMA | Freq: Once | RECTAL | Status: DC | PRN

## 2015-04-28 MED ORDER — OXYCODONE HCL 5 MG PO TABS
10.0000 mg | ORAL_TABLET | Freq: Three times a day (TID) | ORAL | Status: DC | PRN
  Administered 2015-04-28 (×2): 10 mg via ORAL
  Filled 2015-04-28 (×2): qty 2

## 2015-04-28 MED ORDER — MAGNESIUM SULFATE 2 GM/50ML IV SOLN
2.0000 g | Freq: Once | INTRAVENOUS | Status: AC
  Administered 2015-04-28: 2 g via INTRAVENOUS
  Filled 2015-04-28: qty 50

## 2015-04-28 MED ORDER — CLOTRIMAZOLE 1 % EX CREA
TOPICAL_CREAM | Freq: Two times a day (BID) | CUTANEOUS | Status: DC
  Administered 2015-04-28: 1 via TOPICAL
  Administered 2015-04-28: 09:00:00 via TOPICAL
  Administered 2015-04-29: 1 via TOPICAL
  Administered 2015-04-29: 11:00:00 via TOPICAL
  Administered 2015-04-30: 1 via TOPICAL
  Administered 2015-04-30 – 2015-05-01 (×2): via TOPICAL
  Filled 2015-04-28 (×2): qty 15

## 2015-04-28 MED ORDER — LEVOTHYROXINE SODIUM 75 MCG PO TABS
225.0000 ug | ORAL_TABLET | Freq: Every day | ORAL | Status: DC
  Administered 2015-04-28 – 2015-05-01 (×4): 225 ug via ORAL
  Filled 2015-04-28 (×4): qty 3

## 2015-04-28 MED ORDER — PANTOPRAZOLE SODIUM 40 MG PO TBEC
40.0000 mg | DELAYED_RELEASE_TABLET | Freq: Every day | ORAL | Status: DC
  Administered 2015-04-28 – 2015-05-01 (×4): 40 mg via ORAL
  Filled 2015-04-28 (×4): qty 1

## 2015-04-28 MED ORDER — ACETAMINOPHEN 650 MG RE SUPP: 650.0000 mg | Freq: Four times a day (QID) | RECTAL | Status: DC | PRN

## 2015-04-28 MED ORDER — THIAMINE HCL 100 MG/ML IJ SOLN
Freq: Once | INTRAVENOUS | Status: AC
  Administered 2015-04-28: 08:00:00 via INTRAVENOUS
  Filled 2015-04-28: qty 1000

## 2015-04-28 MED ORDER — SENNOSIDES-DOCUSATE SODIUM 8.6-50 MG PO TABS: 1.0000 | ORAL_TABLET | Freq: Every evening | ORAL | Status: DC | PRN

## 2015-04-28 MED ORDER — FOLIC ACID 1 MG PO TABS
1.0000 mg | ORAL_TABLET | Freq: Every day | ORAL | Status: DC
  Administered 2015-04-28 – 2015-05-01 (×4): 1 mg via ORAL
  Filled 2015-04-28 (×4): qty 1

## 2015-04-28 MED ORDER — CITALOPRAM HYDROBROMIDE 20 MG PO TABS
10.0000 mg | ORAL_TABLET | Freq: Every day | ORAL | Status: DC
  Administered 2015-04-28 – 2015-05-01 (×4): 10 mg via ORAL
  Filled 2015-04-28 (×4): qty 1

## 2015-04-28 MED ORDER — MIRTAZAPINE 15 MG PO TABS
7.5000 mg | ORAL_TABLET | Freq: Every day | ORAL | Status: DC
  Administered 2015-04-28 – 2015-04-30 (×3): 7.5 mg via ORAL
  Filled 2015-04-28 (×3): qty 1

## 2015-04-28 NOTE — ED Notes (Signed)
CT came to get patient and pt refused to attempt CT and stated "I want to practice laying flat first with the nurse"

## 2015-04-28 NOTE — H&P (Signed)
[facility] History and Physical  [name] ZOX:[id number] DOB: 02/07/1961 DOA: 04/27/2015  Referring physician: Dr. Denton Carson PCP: Allison Other, DO   Chief Complaint:   HPI: [name] is a 55 y.o. female  With history of depression, type 2 diabetes which resolved with bariatric surgery, history of bariatric surgery, hypertension, venous stasis ulcers to the left lower extremity, vitamin A and D deficiency, protein calorie malnuitrition who presents to the ED with a 1-2 week history of worsening weakness to the point whereby patient is unable to get out of his wheelchair, skin breakdown in this sacral/buttocks region, scrotal swelling with some erythema. Patient is status post right BKA and has a caregiver at home. Patient stated that one week prior to admission he fell in the parking lot and since then weakness has gotten worse to the point where he is unable to stand with worsening scrotal swelling. Patient denies any fevers, no chills, no nausea, no vomiting, no chest pain, no shortness of breath, no palpitations, no abdominal pain, no diarrhea, no constipation, no dysuria, no melena, no hematemesis, no hematochezia, no cough, no Carson associated symptoms. Patient states thyroid medication dose was recently adjusted however has not been able to pick up his medications from the pharmacy yet. CT abdomen and pelvis were done in the ED which showed diffuse soft tissue inflammation and edema about the abdominal and pelvic wall raising concern for soft tissue injury given her recent fall. This extends about the soft tissues superior to the scrotum cellulitis could have an appearance. Fatty infiltration of the liver. Basic metabolic profile done at a potassium of 3.4 otherwise was within normal limits. Lactic acid was 0.83. CBC had a hemoglobin of 10.7 otherwise was within normal limits. [facility] were called to admit the patient for further evaluation and management.   Review of Systems: As  per history of present illness otherwise negative. Constitutional:  No weight loss, night sweats, Fevers, chills, fatigue.  HEENT:  No headaches, Difficulty swallowing,Tooth/dental problems,Sore throat,  No sneezing, itching, ear ache, nasal congestion, post nasal drip,  Cardio-vascular:  No chest pain, Orthopnea, PND, swelling in lower extremities, anasarca, dizziness, palpitations  GI:  No heartburn, indigestion, abdominal pain, nausea, vomiting, diarrhea, change in bowel habits, loss of appetite  Resp:  No shortness of breath with exertion or at rest. No excess mucus, no productive cough, No non-productive cough, No coughing up of blood.No change in color of mucus.No wheezing.No chest wall deformity  Skin:  no rash or lesions.  GU:  no dysuria, change in color of urine, no urgency or frequency. No flank pain.  Musculoskeletal:  No joint pain or swelling. No decreased range of motion. No back pain.  Psych:  No change in mood or affect. No depression or anxiety. No memory loss.   Past Medical History  Diagnosis Date  . Chicken pox   . Depression   . DM type 2 (diabetes mellitus, type 2) (HCC) 2006    Essentially resolved following bariatric surgery  . Venous stasis   . HTN (hypertension)     History of; resolved following bariatric surgery  . History of sleep apnea   . History of malnutrition   . History of vitamin A deficiency    Past Surgical History  Procedure Laterality Date  . Cholecystectomy  1991  . Appendectomy  1991  . Tonsillectomy and adenoidectomy  1967  . Severe malnutrition  2014  . Below knee leg amputation Right 2014  . Hernia repair  2009  .  Gastric sleeve, biliopancreatic diversion, duodenal switch  2006   Social History:  reports that he has never smoked. He has never used smokeless tobacco. He reports that he does not drink alcohol or use illicit drugs.  No Known Allergies  Family History  Problem Relation Age of Onset  . Arthritis Mother   .  Diabetes Mother     Prior to Admission medications   Medication Sig Start Date End Date Taking? Authorizing Provider  citalopram (CELEXA) 10 MG tablet Take 10 mg by mouth daily.  10/30/11  Yes Historical Provider, MD  docusate sodium (COLACE) 100 MG capsule Take 100 mg by mouth daily.   Yes Historical Provider, MD  DULoxetine (CYMBALTA) 20 MG capsule Take 20 mg by mouth daily.  12/29/14  Yes Historical Provider, MD  ferrous sulfate 325 (65 FE) MG tablet Take 325 mg by mouth 2 (two) times daily.    Yes Historical Provider, MD  folic acid (FOLVITE) 1 MG tablet Take 1 mg by mouth daily.  12/29/14  Yes Historical Provider, MD  levothyroxine (SYNTHROID, LEVOTHROID) 200 MCG tablet TAKE ONE TABLET BY MOUTH DAILY 04/05/15  Yes Tommie Sams, DO  Melatonin 5 MG TABS Take 5 mg by mouth at bedtime.   Yes Historical Provider, MD  mirtazapine (REMERON) 7.5 MG tablet Take 7.5 mg by mouth at bedtime.  12/29/14  Yes Historical Provider, MD  morphine (MS CONTIN) 15 MG 12 hr tablet Take 15 mg by mouth 2 (two) times daily. 04/23/15  Yes Historical Provider, MD  Oxycodone HCl 10 MG TABS Take 1 tablet (10 mg total) by mouth every 8 (eight) hours as needed. Patient taking differently: Take 10 mg by mouth every 8 (eight) hours as needed (for breakthrough pain).  01/31/15  Yes Jayce G Cook, DO  pantoprazole (PROTONIX) 40 MG tablet Take 40 mg by mouth daily.  12/29/14  Yes Historical Provider, MD  torsemide (DEMADEX) 20 MG tablet Take 30 mg by mouth daily. Currently takes 1.5 tabs daily   Yes Historical Provider, MD  Vitamin D, Ergocalciferol, (DRISDOL) 50000 UNITS CAPS capsule Take 1 capsule (50,000 Units total) by mouth daily. Patient taking differently: Take 50,000 Units by mouth every Monday.  01/17/15  Yes Tommie Sams, DO  levothyroxine (SYNTHROID, LEVOTHROID) 25 MCG tablet Take 25 mcg by mouth daily before breakfast.    Historical Provider, MD   Physical Exam: Filed Vitals:   04/28/15 0245 04/28/15 0300 04/28/15 0315  04/28/15 0400  BP: 96/65 108/67 101/62 93/60  Pulse: 82 89 81 80  Temp:      TempSrc:      Resp: 16     SpO2: 100% 95% 95% 95%    Wt Readings from Last 3 Encounters:  01/17/15 103.76 kg (228 lb 12 oz)    General:  Pale obese gentleman lying on gurney in no acute cardiopulmonary distress. Speaking in full sentences.  Eyes: PERRLA, EOMI, normal lids, irises & conjunctiva ENT: grossly normal hearing, lips & tongue Neck: no LAD, masses or thyromegaly Cardiovascular: RRR, no m/r/g. No LE edema. Respiratory: CTA bilaterally, no w/r/r. Normal respiratory effort. Abdomen: soft, ntnd, obese, positive bowel sounds, no rebound, no guarding Skin: Groin region as well as intertriginous region with some erythema, nontender to palpation, no warmth. Musculoskeletal: Status post right BKA. Left lower extremity wrapped in the bandage. Psychiatric: grossly normal mood and affect, speech fluent and appropriate Neurologic: Alert and oriented 3. Cranial nerves II through XII grossly intact. No focal deficits.  Labs on Admission:  Basic Metabolic Panel:  Recent Labs Lab 04/27/15 1702  NA 139  K 3.4*  CL 98*  CO2 27  GLUCOSE 118*  BUN 16  CREATININE 1.08  CALCIUM 8.1*   Liver Function Tests: No results for input(s): AST, ALT, ALKPHOS, BILITOT, PROT, ALBUMIN in the last 168 hours. No results for input(s): LIPASE, AMYLASE in the last 168 hours. No results for input(s): AMMONIA in the last 168 hours. CBC:  Recent Labs Lab 04/27/15 1702  WBC 7.9  HGB 10.4*  HCT 32.7*  MCV 94.0  PLT 361   Cardiac Enzymes: No results for input(s): CKTOTAL, CKMB, CKMBINDEX, TROPONINI in the last 168 hours.  BNP (last 3 results) No results for input(s): BNP in the last 8760 hours.  ProBNP (last 3 results) No results for input(s): PROBNP in the last 8760 hours.  CBG:  Recent Labs Lab 04/27/15 1656  GLUCAP 101*    Radiological Exams on Admission: Ct Abdomen Pelvis W  Contrast  04/28/2015  CLINICAL DATA:  Status post fall while walking 1 week ago, with bilateral abdominal pain and scrotal swelling. Initial encounter. EXAM: CT ABDOMEN AND PELVIS WITH CONTRAST TECHNIQUE: Multidetector CT imaging of the abdomen and pelvis was performed using the standard protocol following bolus administration of intravenous contrast. CONTRAST:  80 mL of Omnipaque 300 IV contrast COMPARISON:  Right upper quadrant ultrasound performed 12/05/2013 FINDINGS: Mild bibasilar atelectasis is noted. Diffuse fatty infiltration is noted within the liver. The spleen is unremarkable in appearance. The patient is status post cholecystectomy, with clips noted along the gallbladder fossa. 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 The pancreas and adrenal glands are unremarkable. The kidneys are unremarkable in appearance. There is no evidence of hydronephrosis. No renal or ureteral stones are seen. Minimal nonspecific perinephric stranding is noted bilaterally. No free fluid is identified. The small bowel is unremarkable in appearance. The stomach is within normal limits. No acute vascular abnormalities are seen. Mild scattered calcification is noted along the abdominal aorta and its branches. Diffuse soft tissue inflammation and edema are noted about the abdominal and pelvic wall, raising concern for soft tissue injury. This extends about the soft tissues superior to the scrotum. Cellulitis could have such an appearance. The appendix is grossly unremarkable in appearance, without evidence of appendicitis. The colon is unremarkable in appearance. The bladder is mildly distended and grossly remarkable. The prostate remains normal in size. No inguinal lymphadenopathy is seen. No acute osseous abnormalities are identified. There is chronic compression deformity involving vertebral body T7. Diffuse osteopenia of visualized osseous structures is noted.  IMPRESSION: 1. Diffuse soft tissue inflammation and edema about the abdominal and pelvic wall, raising concern for soft tissue injury given recent fall. This extends about the soft tissues superior to the scrotum. Cellulitis could have such an appearance. 2. Mild scattered calcification along the abdominal aorta and its branches. 3. Diffuse fatty infiltration within the liver. 4. Mild bibasilar atelectasis noted. 5. Chronic compression deformity of vertebral body T7, with diffuse osteopenia of visualized osseous structures Electronically Signed   By: Roanna Raider M.D.   On: 04/28/2015 02:22    EKG: None  Assessment/Plan Principal Problem:   Cellulitis Active Problems:   Hypothyroidism   Anemia, iron deficiency   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   Chronic pain   Venous stasis ulcer of left lower extremity (HCC)   Vitamin D deficiency   S/P bariatric surgery  DM type 2 (diabetes mellitus, type 2) (HCC)   Anemia   Weakness generalized   Hypokalemia   Pressure ulcer of buttock, unstageable (HCC)   Scrotal swelling   Fall   Protein-calorie malnutrition (HCC)  #1 probable groin cellulitis vs fungal dermatitis/scrotal swelling Patient with some erythema in the groin area as well as in the intertriginous areas. Will admit the patient to MedSurg floor. Scrotal ultrasound. Scrotal support. Place empirically on IV Ancef. Follow.  #2 hypothyroidism Patient states TSH was recently checked her PCPs office 1-2 days prior to admission however hasn't picked up his prescriptions yet. Patient states Synthroid dose is supposed to be 225 MCG's daily, will resume this.  #3 left lower extremity venous stasis ulcer/unstageable pressure ulcer on the buttocks Wound care consult.  #4 anemia Check an anemia panel. Follow H&H.  #5 generalized weakness/protein calorie monitor nutrition/vitamin deficiency Patient with a history of bariatric surgery and as such likely not absorbing most of his  nutrients. Patient also had recent falls. PT/OT. Nutrition consult. Placed on Os-Cal with vitamin D. Follow.  #6 hypokalemia Replete.  #7 chronic pain Continue home pain regimen.  #8 prophylaxis PPI for GI prophylaxis. Lovenox for DVT prophylaxis.   Code Status: Full DVT Prophylaxis: Lovenox Family Communication: Updated patient and caregiver. Disposition Plan: Admit to observation.  Time spent: 6565 MINS  Lifew[facility] HospitalHOMPSON,DANIEL MD [facility] Pager [phone]

## 2015-04-28 NOTE — ED Notes (Signed)
This RN in room with pt, pt placed on fracture pan with assistance by Rea CollegeJessica RN. Pt cleaned and pad replaced under pt.

## 2015-04-28 NOTE — Progress Notes (Signed)
PT Cancellation Note  Patient Details Name: [name] MRN: [id number] DOB: [birth year]   Cancelled Treatment:    Reason Eval/Treat Not Completed: Pain limiting ability to participate.  Will return tomorrow for PT evaluation.   Vena AustriaDavis, [name] 04/28/2015, 7:56 PM Durenda HurtSusan Carson. Renaldo Fiddleravis, PT, [facility] Pager [phone]

## 2015-04-28 NOTE — Progress Notes (Signed)
Patient admitted after midnight. Chart reviewed. Patient examined. Reports weakness started after a fall 2 weeks ago. Right hip pain and " my finger is broken". Ortho follow up scheduled for Monday. Continue IV ancef. Await PT, OT. Leg edema. [facility]. Lives alone and previously independent  Allison Curborinna Yanixan Mellinger, MD [facility] [email or web address] password [email or web address]

## 2015-04-28 NOTE — Progress Notes (Addendum)
Initial Nutrition Assessment  DOCUMENTATION CODES:   Obesity unspecified  INTERVENTION:  Suspect patient is fat malabsorbing. His recent water soluble vitamins appear WDL and is gaining weight, Yet is deficienct in fat soluble vitamins despite reported adequate supplementation.  These would need to be repleted via alternate means. Fecal fat test could confirm this.    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  GOAL:  Patient will meet greater than or equal to 90% of their needs  MONITOR:  PO intake, Labs, I & O's, Skin, Supplement acceptance  REASON FOR ASSESSMENT:  Consult  (vitamin deficiency/malnutrition/hx of gastic surgery.)  ASSESSMENT:  55 y/o female PMHx DM2, (resolved w/ bariatric surgery), HTN (essentially resolved w/ bariatric surgery) Venous stasis ulcer, R BKA, Vit A/D deficiency, PCM presents with 1-2 week hx of worsening weakness, skin breakdown (sacrum/buttocks), scrotal swelling. Per H&p, pt has hx of Gastric sleeve, biliopancreatic diversion, duodenal switch from 2006.   Very interesting individual. He is very knowledgeable regarding nutrition, though he has some misconceptions. He is very invested in his labwork and recites his last albumin levels from his past hospital admissions. He says he once he took a supplement that claimed it contained protein that was metabolized to albumin. Taking this caused him to become septic and he was on TPN in the hospital icu  He reports he went to [place] in 2006 to have his BPDDS surgery done. He states his weight at the time of surgery was 410 lbs. He states he quickly lost to 265 lbs, and got as  low as 210 lbs and has slightly regained to 231 lbs.   He is an avid label reader and states he eats exactly 120 g of protein each day. Multiple premier protein drinks are seen at bedside. He does not eat vegetables "because the fiber fills me up and I dont absorb the vitamins anyway"-educated that this could help him lose weight/maintain loss, as he is regaining now. He obviously watches his carbohydrate intake as he asked to limit the carbs on his tray.   Per H&P: Scheduled OP vitamins/ minerals: Folate 1 mg (daily), Iron 325 mg BID, Vit D 50,000 units q weekly.   Besides these, he says he also takes 3 gummy mvis daily. He says he takes calcium-citrate but did not know dosage. He does not take B12, but his b12 level was found to be very high? Potentially his supplements/nutritional beverages are high in b12. Folate was also found to be fairly high.   Despite his home iron supplements, he today is found to be very deficient in iron (23). Visually, patient has severe pallor.   Attempted to do full physical assessment. His tongue did appear slightly smooth, relating to glossitis? Attempted to check eye for bitot spots, but sensed patient seemed to become offended stating "Everything is fine". Did not attempt further physical assessment.   RD attempted multiple times to address some of his dietary misconceptions and some of his gaps in supplementation- appears to get offended and will say 1 word answers "okay" or "fine" in a very unconvincing tone.   Labs reviewed: Very low iron. Folate:higher. B12: elevated. Hypokalemic. Hypocalcemia. Very low prealbumin. Albumin  2.2  On 12/14. Found to be very deficient in Vit A/Vit D.   Patients home vit/min supplement is lacking Vitamin/Min supplementation 2013 guidelines ASMBS for BPD/DS: 200% DV vitamin/minerals, can be in form of 2x Children MVI and additional:  B12 1000 mcg, Vit D3 3,000 IU (if not taking mega dose 50,000 weekly) Iron 45 - 60  mg, calcium citrate w/Vit D 1800-2400 mg VIt A 10,000 IU/day Vit K 300 mcg/day  Diet Order:  Diet regular Room service appropriate?: Yes; Fluid consistency:: Thin  Skin:  Dry/Falky, Cellulits groin, MSAD, multiple buttocks PU stage 2 ulcers (Mid, right, Left, lower), PU stage 2 buttocks, 2 open wounds left leg  Last BM:  3/4  Height:  Ht Readings from Last 1 Encounters:  04/28/15 6' (1.829 m)   Weight:  Wt Readings from Last 1 Encounters:  04/28/15 231 lb 4.2 oz (104.9 kg)   Wt Readings from Last 10 Encounters:  04/28/15 231 lb 4.2 oz (104.9 kg)  01/17/15 228 lb 12 oz (103.76 kg)   Ideal Body Weight:  75.65 kg (Adjusted for BKA)  BMI:  ADJUSTED Body mass index is 33.5 kg/(m^2).  Estimated Nutritional Needs:  Kcal:  1900-2100 kcals (18-20 kcal/kg bw) Protein:  91-106 g Pro (1.2-1.4 g/kg ibw) Fluid:  2.1 liters  EDUCATION NEEDS:  Education needs addressed  Christophe LouisNathan Franks RD, LDN Nutrition Pager: [phone] 04/28/2015 6:34 PM

## 2015-04-28 NOTE — ED Notes (Signed)
Pt educated about CT procedure and the duration..... This RN called CT and notified them that this RN will bring pt over to CT when ready.

## 2015-04-28 NOTE — ED Provider Notes (Signed)
Patient signed out by earlier MD/NP team that ct pending.  Pt w progressive gen weakness in past 1-2 weeks, lives at home, unable to transfer or care for self, new/increased skin breakdown bil buttocks.  Pt w increase scrotal edema, soreness ?cellulitis. No crepitus, no necrotic tissue, no fourniers.  bp soft, however pt notes his bp can be in 90/60 range normally.    Will add lactic acid level to labs. Will cover for possible cellulitis vs fungal dermatitis.   Pt deconditioned, will need wound care and PT eval.  Medical service contacted for admission.     Cathren LaineKevin Neiko Trivedi, MD 04/28/15 [phone]

## 2015-04-29 DIAGNOSIS — L0390 Cellulitis, unspecified: Secondary | ICD-10-CM

## 2015-04-29 LAB — HEMOGLOBIN AND HEMATOCRIT, BLOOD
HEMATOCRIT: 27.4 % — AB (ref 39.0–52.0)
HEMOGLOBIN: 8.4 g/dL — AB (ref 13.0–17.0)

## 2015-04-29 LAB — BASIC METABOLIC PANEL
ANION GAP: 7 (ref 5–15)
BUN: 12 mg/dL (ref 6–20)
CHLORIDE: 106 mmol/L (ref 101–111)
CO2: 27 mmol/L (ref 22–32)
Calcium: 7.3 mg/dL — ABNORMAL LOW (ref 8.9–10.3)
Creatinine, Ser: 0.79 mg/dL (ref 0.61–1.24)
GFR calc Af Amer: >60 mL/min (ref >60)
Glucose, Bld: 102 mg/dL — ABNORMAL HIGH (ref 65–99)
POTASSIUM: 3.9 mmol/L (ref 3.5–5.1)
SODIUM: 140 mmol/L (ref 135–145)

## 2015-04-29 MED ORDER — SODIUM CHLORIDE 0.9 % IV BOLUS (SEPSIS)
500.0000 mL | Freq: Once | INTRAVENOUS | Status: AC
  Administered 2015-04-29: 500 mL via INTRAVENOUS

## 2015-04-29 NOTE — Evaluation (Signed)
Physical Therapy Evaluation Patient Details Name: Allison HickKevin Carson MRN: [id number] DOB: 1961/01/30 Today's Date: 04/29/2015   History of Present Illness  55 y.o. female with history of depression, type 2 diabetes which resolved with bariatric surgery, history of bariatric surgery, hypertension, venous stasis ulcers to the left lower extremity, vitamin A and D deficiency, protein calorie malnuitrition who presents with a 1-2 week history of worsening weakness to the point whereby patient is unable to get out of his wheelchair, skin breakdown in this sacral/buttocks region, scrotal swelling with some erythema.  Clinical Impression  Pt admitted with the above complications. Pt currently with functional limitations due to the deficits listed below (see PT Problem List). Presents with considerable weakness. Reports PTA, he was independent with a cane to ambulate short distances but primarily used his power wheelchair. States he had no difficulty with transfers into power wheelchair and was able to drive a vehicle. Since a recent fall his decline in functional mobility has quickly progressed. Pt required +2 assistance today, was motivated to work on transfers however due to weakness was unable to safely perform a full squat pivot transfer from bed to powerchair.  Pt will benefit from skilled PT to increase their independence and safety with mobility to allow discharge to the venue listed below.       Follow Up Recommendations CIR    Equipment Recommendations  None recommended by PT    Recommendations for Other Services Rehab consult;OT consult     Precautions / Restrictions Precautions Precautions: Fall Precaution Comments: Hx of Rt knee BKA Restrictions Weight Bearing Restrictions: No      Mobility  Bed Mobility Overal bed mobility: Needs Assistance;+2 for physical assistance Bed Mobility: Supine to Sit;Rolling;Sit to Sidelying Rolling: Min assist   Supine to sit: Mod assist;+2 for physical  assistance;HOB elevated   Sit to sidelying: Min assist General bed mobility comments: Mod assist +2 for truncal and LE support to rise to EOB using bed pad to scoot and reduce friction across buttocks. Min assist for sit to sidelying with LLE support back into bed. Practiced rolling to Lt and Rt side with min assist and cues to use rail as needed.  Transfers Overall transfer level: Needs assistance Equipment used: None Transfers: Squat Pivot Transfers     Squat pivot transfers: +2 physical assistance;From elevated surface;Max assist     General transfer comment: Attempted squat pivot transfer to powerchair from bed. Pt too weak to complete safely. Demonstrates little LLE use to lift buttocks, assisting with bed pad to reduce friction. Limited RUE use due to finger splinting and guarding. Demonstrates good effort but unable to safely perform at this time. Made it nearly half way into chair before too fatigued to continue. Required max assist to scoot back into bed.  Ambulation/Gait                Stairs            Wheelchair Mobility    Modified Rankin (Stroke Patients Only)       Balance Overall balance assessment: Needs assistance;History of Falls Sitting-balance support: No upper extremity supported;Feet supported Sitting balance-Leahy Scale: Good                                       Pertinent Vitals/Pain Pain Assessment: 0-10 Pain Score: 6  Pain Location: phantom limb pain, groin, and UEs Pain Descriptors / Indicators: Aching Pain  Intervention(s): Monitored during session;Repositioned    Home Living Family/patient expects to be discharged to:: Private residence Living Arrangements: Alone Available Help at Discharge: Personal care attendant (3x/week) Type of Home: Apartment Home Access: Level entry     Home Layout: One level Home Equipment: Wheelchair - power;Cane - single point;Walker - 2 wheels;Walker - 4 wheels;Bedside commode;Tub  bench (RLE prosthesis)      Prior Function Level of Independence: Independent with assistive device(s)         Comments: Primarily power w/c, walking short distances and driving.     Hand Dominance   Dominant Hand: Right    Extremity/Trunk Assessment   Upper Extremity Assessment: Defer to OT evaluation           Lower Extremity Assessment: LLE deficits/detail;RLE deficits/detail   LLE Deficits / Details: bandaged LE, unable to lift against gravity, good ankle strength.     Communication   Communication: No difficulties  Cognition Arousal/Alertness: Awake/alert Behavior During Therapy: WFL for tasks assessed/performed Overall Cognitive Status: Within Functional Limits for tasks assessed                      General Comments General comments (skin integrity, edema, etc.): Encouraged to wear shrinker sock on Rt residual limb and rest with Rt knee in extension. Sister present and supportive.    Exercises General Exercises - Lower Extremity Ankle Circles/Pumps: AROM;Left;20 reps;Supine Quad Sets: Strengthening;Both;10 reps;Supine Gluteal Sets: Strengthening;5 reps;Both;Supine      Assessment/Plan    PT Assessment Patient needs continued PT services  PT Diagnosis Difficulty walking;Generalized weakness;Acute pain   PT Problem List Decreased strength;Decreased activity tolerance;Decreased balance;Decreased mobility;Decreased range of motion;Obesity;Decreased skin integrity;Pain  PT Treatment Interventions DME instruction;Gait training;Functional mobility training;Therapeutic activities;Therapeutic exercise;Balance training;Neuromuscular re-education;Patient/family education   PT Goals (Current goals can be found in the Care Plan section) Acute Rehab PT Goals Patient Stated Goal: Regain my independence. PT Goal Formulation: With patient Time For Goal Achievement: 05/13/15 Potential to Achieve Goals: Good    Frequency Min 3X/week   Barriers to discharge  Decreased caregiver support lives alone, has an aide 3x/week    Co-evaluation               End of Session Equipment Utilized During Treatment: Gait belt Activity Tolerance: Patient limited by fatigue;Patient limited by pain Patient left: in bed;with call bell/phone within reach;with family/visitor present Nurse Communication:  (Could not reach - advise hoyer lift use for OOB)         Time: 9147-8295 PT Time Calculation (min) (ACUTE ONLY): 32 min   Charges:   PT Evaluation $PT Eval Moderate Complexity: 1 Procedure PT Treatments $Therapeutic Activity: 8-22 mins   PT G CodesBerton Mount 04/29/2015, 3:02 PM [name], [name] [phone]

## 2015-04-29 NOTE — Consult Note (Signed)
[name] is an 55 y.o. female.   Chief Complaint: right ring finger fracture HPI: 54 yo rhd female states he fell beside his car approximately 1.5 weeks ago and sustained an injury to his right ring finger.  He reports no previous injury to the finger.  He has no pain in the finger at this time.  He has been admitted for worsening weakness of the lower extremities.    Allergies: No Known Allergies  Past Medical History  Diagnosis Date  . Chicken pox   . Depression   . DM type 2 (diabetes mellitus, type 2) ([place]) 2006    Essentially resolved following bariatric surgery  . Venous stasis   . HTN (hypertension)     History of; resolved following bariatric surgery  . History of sleep apnea   . History of malnutrition   . History of vitamin A deficiency     Past Surgical History  Procedure Laterality Date  . Cholecystectomy  1991  . Appendectomy  1991  . Tonsillectomy and adenoidectomy  1967  . Severe malnutrition  2014  . Below knee leg amputation Right 2014  . Hernia repair  2009  . Gastric sleeve, biliopancreatic diversion, duodenal switch  2006    Family History: Family History  Problem Relation Age of Onset  . Arthritis Mother   . Diabetes Mother     Social History:   reports that he has never smoked. He has never used smokeless tobacco. He reports that he does not drink alcohol or use illicit drugs.  Medications: Medications Prior to Admission  Medication Sig Dispense Refill  . citalopram (CELEXA) 10 MG tablet Take 10 mg by mouth daily.     Marland Kitchen docusate sodium (COLACE) 100 MG capsule Take 100 mg by mouth daily.    . DULoxetine (CYMBALTA) 20 MG capsule Take 20 mg by mouth daily.     . ferrous sulfate 325 (65 FE) MG tablet Take 325 mg by mouth 2 (two) times daily.     . folic acid (FOLVITE) 1 MG tablet Take 1 mg by mouth daily.     Marland Kitchen levothyroxine (SYNTHROID, LEVOTHROID) 200 MCG tablet TAKE ONE TABLET BY MOUTH DAILY 90 tablet 1  . Melatonin 5 MG TABS Take 5 mg by mouth  at bedtime.    . mirtazapine (REMERON) 7.5 MG tablet Take 7.5 mg by mouth at bedtime.     Marland Kitchen morphine (MS CONTIN) 15 MG 12 hr tablet Take 15 mg by mouth 2 (two) times daily.    . Oxycodone HCl 10 MG TABS Take 1 tablet (10 mg total) by mouth every 8 (eight) hours as needed. (Patient taking differently: Take 10 mg by mouth every 8 (eight) hours as needed (for breakthrough pain). ) 60 tablet 0  . pantoprazole (PROTONIX) 40 MG tablet Take 40 mg by mouth daily.     Marland Kitchen torsemide (DEMADEX) 20 MG tablet Take 30 mg by mouth daily. Currently takes 1.5 tabs daily    . Vitamin D, Ergocalciferol, (DRISDOL) 50000 UNITS CAPS capsule Take 1 capsule (50,000 Units total) by mouth daily. (Patient taking differently: Take 50,000 Units by mouth every Monday. ) 30 capsule 0  . levothyroxine (SYNTHROID, LEVOTHROID) 25 MCG tablet Take 25 mcg by mouth daily before breakfast.      Results for orders placed or performed during the hospital encounter of 04/27/15 (from the past 48 hour(s))  Urinalysis, Routine w reflex microscopic (not at [facility])     Status: None   Collection Time:  04/28/15 12:28 AM  Result Value Ref Range   Color, Urine YELLOW YELLOW   APPearance CLEAR CLEAR   Specific Gravity, Urine 1.010 1.005 - 1.030   pH 5.5 5.0 - 8.0   Glucose, UA NEGATIVE NEGATIVE mg/dL   Hgb urine dipstick NEGATIVE NEGATIVE   Bilirubin Urine NEGATIVE NEGATIVE   Ketones, ur NEGATIVE NEGATIVE mg/dL   Protein, ur NEGATIVE NEGATIVE mg/dL   Nitrite NEGATIVE NEGATIVE   Leukocytes, UA NEGATIVE NEGATIVE    Comment: MICROSCOPIC NOT DONE ON URINES WITH NEGATIVE PROTEIN, BLOOD, LEUKOCYTES, NITRITE, OR GLUCOSE <1000 mg/dL.  I-Stat CG4 Lactic Acid, ED     Status: None   Collection Time: 04/28/15  3:29 AM  Result Value Ref Range   Lactic Acid, Venous 0.83 0.5 - 2.0 mmol/L  Prealbumin     Status: Abnormal   Collection Time: 04/28/15  7:50 AM  Result Value Ref Range   Prealbumin 2.6 (L) 18 - 38 mg/dL  Comprehensive metabolic panel      Status: Abnormal   Collection Time: 04/28/15  7:50 AM  Result Value Ref Range   Sodium 139 135 - 145 mmol/L   Potassium 3.2 (L) 3.5 - 5.1 mmol/L   Chloride 103 101 - 111 mmol/L   CO2 27 22 - 32 mmol/L   Glucose, Bld 133 (H) 65 - 99 mg/dL   BUN 13 6 - 20 mg/dL   Creatinine, Ser 0.82 0.61 - 1.24 mg/dL   Calcium 7.3 (L) 8.9 - 10.3 mg/dL   Total Protein 5.6 (L) 6.5 - 8.1 g/dL   Albumin 2.2 (L) 3.5 - 5.0 g/dL   AST 20 15 - 41 U/L   ALT 15 (L) 17 - 63 U/L   Alkaline Phosphatase 213 (H) 38 - 126 U/L   Total Bilirubin 0.7 0.3 - 1.2 mg/dL   GFR calc non Af Amer >60 >60 mL/min   GFR calc Af Amer >60 >60 mL/min    Comment: (NOTE) The eGFR has been calculated using the CKD EPI equation. This calculation has not been validated in all clinical situations. eGFR's persistently <60 mL/min signify possible Chronic Kidney Disease. CORRECTED ON 03/04 AT 0945: PREVIOUSLY REPORTED AS >60    Anion gap 9 5 - 15  Magnesium     Status: Abnormal   Collection Time: 04/28/15  7:50 AM  Result Value Ref Range   Magnesium 1.6 (L) 1.7 - 2.4 mg/dL  Phosphorus     Status: Abnormal   Collection Time: 04/28/15  7:50 AM  Result Value Ref Range   Phosphorus 2.2 (L) 2.5 - 4.6 mg/dL  CBC WITH DIFFERENTIAL     Status: Abnormal   Collection Time: 04/28/15  7:50 AM  Result Value Ref Range   WBC 6.9 4.0 - 10.5 K/uL   RBC 3.02 (L) 4.22 - 5.81 MIL/uL   Hemoglobin 8.5 (L) 13.0 - 17.0 g/dL   HCT 28.1 (L) 39.0 - 52.0 %   MCV 93.0 78.0 - 100.0 fL   MCH 28.1 26.0 - 34.0 pg   MCHC 30.2 30.0 - 36.0 g/dL   RDW 15.9 (H) 11.5 - 15.5 %   Platelets 308 150 - 400 K/uL   Neutrophils Relative % 75 %   Neutro Abs 5.2 1.7 - 7.7 K/uL   Lymphocytes Relative 17 %   Lymphs Abs 1.1 0.7 - 4.0 K/uL   Monocytes Relative 6 %   Monocytes Absolute 0.4 0.1 - 1.0 K/uL   Eosinophils Relative 2 %   Eosinophils Absolute 0.2 0.0 -  0.7 K/uL   Basophils Relative 0 %   Basophils Absolute 0.0 0.0 - 0.1 K/uL  Vitamin B12     Status: Abnormal    Collection Time: 04/28/15  7:50 AM  Result Value Ref Range   Vitamin B-12 1413 (H) 180 - 914 pg/mL  Folate     Status: None   Collection Time: 04/28/15  7:50 AM  Result Value Ref Range   Folate 29.1 >5.9 ng/mL  Iron and TIBC     Status: Abnormal   Collection Time: 04/28/15  7:50 AM  Result Value Ref Range   Iron 23 (L) 45 - 182 ug/dL   TIBC NOT CALCULATED 250 - 450 ug/dL   Saturation Ratios NOT CALCULATED 17.9 - 39.5 %   UIBC NOT CALCULATED ug/dL  Ferritin     Status: Abnormal   Collection Time: 04/28/15  7:50 AM  Result Value Ref Range   Ferritin 429 (H) 24 - 336 ng/mL  CK     Status: None   Collection Time: 04/28/15  2:26 PM  Result Value Ref Range   Total CK 87 49 - 397 U/L  Basic metabolic panel     Status: Abnormal   Collection Time: 04/29/15  6:20 AM  Result Value Ref Range   Sodium 140 135 - 145 mmol/L   Potassium 3.9 3.5 - 5.1 mmol/L   Chloride 106 101 - 111 mmol/L   CO2 27 22 - 32 mmol/L   Glucose, Bld 102 (H) 65 - 99 mg/dL   BUN 12 6 - 20 mg/dL   Creatinine, Ser 0.79 0.61 - 1.24 mg/dL   Calcium 7.3 (L) 8.9 - 10.3 mg/dL   GFR calc non Af Amer >60 >60 mL/min   GFR calc Af Amer >60 >60 mL/min    Comment: (NOTE) The eGFR has been calculated using the CKD EPI equation. This calculation has not been validated in all clinical situations. eGFR's persistently <60 mL/min signify possible Chronic Kidney Disease.    Anion gap 7 5 - 15  Hemoglobin and hematocrit, blood     Status: Abnormal   Collection Time: 04/29/15  6:20 AM  Result Value Ref Range   Hemoglobin 8.4 (L) 13.0 - 17.0 g/dL   HCT 27.4 (L) 39.0 - 52.0 %    US Scrotum  04/28/2015  CLINICAL DATA:  Subacute onset of scrotal swelling, status post traumatic injury. Initial encounter. EXAM: SCROTAL ULTRASOUND DOPPLER ULTRASOUND OF THE TESTICLES TECHNIQUE: Complete ultrasound examination of the testicles, epididymis, and other scrotal structures was performed. Color and spectral Doppler ultrasound were also utilized  to evaluate blood flow to the testicles. COMPARISON:  CT of the abdomen and pelvis performed earlier today at 1:06 a.m. FINDINGS: Right testicle Measurements: 2.5 x 1.5 x 2.0 cm. No mass or microlithiasis visualized. Left testicle Measurements: 2.3 x 1.6 x 1.8 cm. No mass or microlithiasis visualized. Right epididymis:  Not visualized. Left epididymis:  Not visualized. Hydrocele:  None visualized. Varicocele:  None visualized. Pulsed Doppler interrogation of both testes demonstrates normal low resistance arterial and venous waveforms bilaterally. Vague soft tissue edema is noted at the scrotum. There is also extensive fat about the scrotal wall. IMPRESSION: 1. No evidence of testicular torsion. 2. Mild vague soft tissue edema noted at the scrotum. Electronically Signed   By: Garald Balding M.D.   On: 04/28/2015 07:14   Ct Abdomen Pelvis W Contrast  04/28/2015  CLINICAL DATA:  Status post fall while walking 1 week ago, with bilateral abdominal pain and  scrotal swelling. Initial encounter. EXAM: CT ABDOMEN AND PELVIS WITH CONTRAST TECHNIQUE: Multidetector CT imaging of the abdomen and pelvis was performed using the standard protocol following bolus administration of intravenous contrast. CONTRAST:  80 mL of Omnipaque 300 IV contrast COMPARISON:  Right upper quadrant ultrasound performed 12/05/2013 FINDINGS: Mild bibasilar atelectasis is noted. Diffuse fatty infiltration is noted within the liver. The spleen is unremarkable in appearance. The patient is status post cholecystectomy, with clips noted along the gallbladder fossa. 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 The pancreas and adrenal glands are unremarkable. The kidneys are unremarkable in appearance. There is no evidence of hydronephrosis. No renal or ureteral stones are seen. Minimal nonspecific perinephric stranding is noted bilaterally. No free fluid is identified. The small bowel is  unremarkable in appearance. The stomach is within normal limits. No acute vascular abnormalities are seen. Mild scattered calcification is noted along the abdominal aorta and its branches. Diffuse soft tissue inflammation and edema are noted about the abdominal and pelvic wall, raising concern for soft tissue injury. This extends about the soft tissues superior to the scrotum. Cellulitis could have such an appearance. The appendix is grossly unremarkable in appearance, without evidence of appendicitis. The colon is unremarkable in appearance. The bladder is mildly distended and grossly remarkable. The prostate remains normal in size. No inguinal lymphadenopathy is seen. No acute osseous abnormalities are identified. There is chronic compression deformity involving vertebral body T7. Diffuse osteopenia of visualized osseous structures is noted. IMPRESSION: 1. Diffuse soft tissue inflammation and edema about the abdominal and pelvic wall, raising concern for soft tissue injury given recent fall. This extends about the soft tissues superior to the scrotum. Cellulitis could have such an appearance. 2. Mild scattered calcification along the abdominal aorta and its branches. 3. Diffuse fatty infiltration within the liver. 4. Mild bibasilar atelectasis noted. 5. Chronic compression deformity of vertebral body T7, with diffuse osteopenia of visualized osseous structures Electronically Signed   By: Garald Balding M.D.   On: 04/28/2015 02:22   [place] Art/ven Flow Abd Pelv Doppler  04/28/2015  CLINICAL DATA:  Subacute onset of scrotal swelling, status post traumatic injury. Initial encounter. EXAM: SCROTAL ULTRASOUND DOPPLER ULTRASOUND OF THE TESTICLES TECHNIQUE: Complete ultrasound examination of the testicles, epididymis, and other scrotal structures was performed. Color and spectral Doppler ultrasound were also utilized to evaluate blood flow to the testicles. COMPARISON:  CT of the abdomen and pelvis performed earlier today  at 1:06 a.m. FINDINGS: Right testicle Measurements: 2.5 x 1.5 x 2.0 cm. No mass or microlithiasis visualized. Left testicle Measurements: 2.3 x 1.6 x 1.8 cm. No mass or microlithiasis visualized. Right epididymis:  Not visualized. Left epididymis:  Not visualized. Hydrocele:  None visualized. Varicocele:  None visualized. Pulsed Doppler interrogation of both testes demonstrates normal low resistance arterial and venous waveforms bilaterally. Vague soft tissue edema is noted at the scrotum. There is also extensive fat about the scrotal wall. IMPRESSION: 1. No evidence of testicular torsion. 2. Mild vague soft tissue edema noted at the scrotum. Electronically Signed   By: Garald Balding M.D.   On: 04/28/2015 07:14   Dg Hand Complete Right  04/28/2015  CLINICAL DATA:  Fall 1 week ago. Right hand pain and decreased range of motion. Initial encounter. EXAM: RIGHT HAND - COMPLETE 3+ VIEW COMPARISON:  None. FINDINGS: Mildly displaced fractures are seen involving the proximal volar margins of the proximal and middle phalanges of the ring finger. No evidence of  dislocation. No other fractures identified. IMPRESSION: Volar plate avulsion fractures involving the proximal and middle phalanges of the ring finger. Electronically Signed   By: Earle Gell M.D.   On: 04/28/2015 14:32   Dg Hip Unilat With Pelvis 2-3 Views Left  04/28/2015  CLINICAL DATA:  Pain EXAM: DG HIP (WITH OR WITHOUT PELVIS) 2-3V LEFT COMPARISON:  None FINDINGS: Retained contrast in a distended bladder. Diffuse osseous demineralization. Hip joint spaces preserved. Scattered soft tissue and stool artifacts. No definite fracture or bone destruction. IMPRESSION: Osseous demineralization without definite acute bony abnormalities. Electronically Signed   By: Lavonia Dana M.D.   On: 04/28/2015 14:30     A comprehensive review of systems was negative. Review of Systems: No fevers, chills, night sweats, chest pain, shortness of breath, nausea, vomiting,  diarrhea, constipation, easy bleeding or bruising, headaches, dizziness, vision changes, fainting.   Blood pressure 98/58, pulse 75, temperature 98.5 F (36.9 C), temperature source Oral, resp. rate 20, height 6' (1.829 m), weight 104.9 kg (231 lb 4.2 oz), SpO2 94 %.  General appearance: alert, cooperative and appears stated age Head: Normocephalic, without obvious abnormality, atraumatic Neck: supple, symmetrical, trachea midline Extremities: intact sensation and capillary refill all digits.  +epl/fpl/io.  Left ue: no wounds or ttp.  Right ue: no wounds or ttp.  Unable to flex at the dip of the ring finger.  Adequate extension.  No ecchymosis. Pulses: 2+ and symmetric Skin: Skin color, texture, turgor normal. No rashes or lesions Neurologic: Grossly normal Incision/Wound: None in hands.  Assessment/Plan Right ring finger fractures of volar plate and distal phalanx.  Distal phalanx fracture likely a type IV [place] finger.  Will obtain CT to determine if fragment is flipped around.  Discussed potential for surgical intervention for treatment.  This may be done as an outpatient.  He agrees with the plan of care.  Finger splinted.  Annikah Lovins,Wafaa R 04/29/2015, 7:14 PM

## 2015-04-29 NOTE — Progress Notes (Addendum)
[facility] PROGRESS NOTE  Allison HickKevin Carson UJW:[id number] DOB: 03-Jul-1960 DOA: 04/27/2015 PCP: [name] with [facility]  Assessment/Plan:  Principal Problem:   Scrotal Cellulitis: improving on ancef. 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 Might need placement. Lives alone   Hypokalemia corrected   Pressure ulcer of buttock, unstageable (HCC): Per RN, stage 2, no infection   Fall 2 weeks ago Right hip pain xray and CT without fx, just arthritis   Protein-calorie malnutrition (HCC) Finger fracture: volar plate avulsion fx proximal and middle phalanges of right ring finger. Discussed with Dr. Merlyn LotKuzma who recommends CT finger to further evaluate. And dorsal blocking splint. He will see as inpatient v. Outpatient.  HPI/Subjective: C/o right hip pain.  Objective: Filed Vitals:   04/28/15 2245 04/29/15 0506  BP: 103/58 96/55  Pulse:  72  Temp:  98 F (36.7 C)  Resp:  18    Intake/Output Summary (Last 24 hours) at 04/29/15 0942 Last data filed at 04/29/15 0912  Gross per 24 hour  Intake    540 ml  Output   1101 ml  Net   -561 ml   Filed Weights   04/28/15 0436  Weight: 104.9 kg (231 lb 4.2 oz)    Exam:   General:  Nontoxic. A and o, cooperative  Cardiovascular: RRR without MGR  Respiratory: CTA without   Abdomen: s, nt, nd  GU: cellulitis of scrotum improving  Ext: left LE with erythema, yellow crust and drainage, ulcers with white exudate. Per patient chronic for >1 year. Unable to extend right ring finger fully. No tenderness. Right hip with full ROM, no  tenderness  Basic Metabolic Panel:  Recent Labs Lab 04/27/15 1702 04/28/15 0750 04/29/15 0620  NA 139 139 140  K 3.4* 3.2* 3.9  CL 98* 103 106  CO2 27 27 27   GLUCOSE 118* 133* 102*  BUN 16 13 12   CREATININE 1.08 0.82 0.79  CALCIUM 8.1* 7.3* 7.3*  MG  --  1.6*  --   PHOS  --  2.2*  --    Liver Function Tests:  Recent Labs Lab 04/28/15 0750  AST 20  ALT 15*  ALKPHOS 213*  BILITOT 0.7  PROT 5.6*  ALBUMIN 2.2*   No results for input(s): LIPASE, AMYLASE in the last 168 hours. No results for input(s): AMMONIA in the last 168 hours. CBC:  Recent Labs Lab 04/27/15 1702 04/28/15 0750 04/29/15 0620  WBC 7.9 6.9  --   NEUTROABS  --  5.2  --   HGB 10.4* 8.5* 8.4*  HCT 32.7* 28.1* 27.4*  MCV 94.0 93.0  --   PLT 361 308  --    Cardiac Enzymes:  Recent Labs Lab 04/28/15 1426  CKTOTAL 87   BNP (last 3 results) No results for input(s): BNP in the last 8760 hours.  ProBNP (last 3 results) No results for input(s): PROBNP in the last 8760 hours.  CBG:  Recent Labs Lab 04/27/15 1656  GLUCAP 101*    No results found for this or any previous visit (from the past 240 hour(s)).  Studies: US Scrotum  04/28/2015  CLINICAL DATA:  Subacute onset of scrotal swelling, status post traumatic injury. Initial encounter. EXAM: SCROTAL ULTRASOUND DOPPLER ULTRASOUND OF THE TESTICLES TECHNIQUE: Complete ultrasound examination of the testicles, epididymis, and other scrotal structures was performed. Color and spectral Doppler ultrasound were also utilized to evaluate blood flow to the testicles. COMPARISON:  CT of the abdomen and pelvis performed earlier today at 1:06 a.m. FINDINGS: Right testicle Measurements: 2.5 x 1.5 x 2.0 cm. No mass or microlithiasis visualized. Left testicle Measurements: 2.3 x 1.6 x 1.8 cm. No mass or microlithiasis visualized. Right epididymis:  Not visualized. Left epididymis:  Not visualized. Hydrocele:  None visualized. Varicocele:  None visualized.  Pulsed Doppler interrogation of both testes demonstrates normal low resistance arterial and venous waveforms bilaterally. Vague soft tissue edema is noted at the scrotum. There is also extensive fat about the scrotal wall. IMPRESSION: 1. No evidence of testicular torsion. 2. Mild vague soft tissue edema noted at the scrotum. Electronically Signed   By: Roanna Raider M.D.   On: 04/28/2015 07:14   Ct Abdomen Pelvis W Contrast  04/28/2015  CLINICAL DATA:  Status post fall while walking 1 week ago, with bilateral abdominal pain and scrotal swelling. Initial encounter. EXAM: CT ABDOMEN AND PELVIS WITH CONTRAST TECHNIQUE: Multidetector CT imaging of the abdomen and pelvis was performed using the standard protocol following bolus administration of intravenous contrast. CONTRAST:  80 mL of Omnipaque 300 IV contrast COMPARISON:  Right upper quadrant ultrasound performed 12/05/2013 FINDINGS: Mild bibasilar atelectasis is noted. Diffuse fatty infiltration is noted within the liver. The spleen is unremarkable in appearance. The patient is status post cholecystectomy, with clips noted along the gallbladder fossa. 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 The pancreas and adrenal glands are unremarkable. The kidneys are unremarkable in appearance. There is no evidence of hydronephrosis. No renal or ureteral stones are seen. Minimal nonspecific perinephric stranding is noted bilaterally. No free fluid is identified. The small bowel is unremarkable in appearance. The stomach is within normal limits. No acute vascular abnormalities are seen. Mild scattered calcification is noted along the abdominal aorta and its branches. Diffuse soft tissue inflammation and edema are noted about the abdominal and pelvic wall, raising concern for soft tissue injury. This extends about the soft tissues superior to the scrotum. Cellulitis could have such an appearance. The appendix is  grossly unremarkable in appearance, without evidence of appendicitis. The colon is unremarkable in appearance. The bladder is mildly distended and grossly remarkable. The prostate remains normal in size. No inguinal lymphadenopathy is seen. No acute osseous abnormalities are identified. There is chronic compression deformity involving vertebral body T7. Diffuse osteopenia of visualized osseous structures is noted. IMPRESSION: 1. Diffuse soft tissue inflammation and edema about the abdominal and pelvic wall, raising concern for soft tissue injury given recent fall. This extends about the soft tissues superior to the scrotum. Cellulitis could have such an appearance. 2. Mild scattered calcification along the abdominal aorta and its branches. 3. Diffuse fatty infiltration within the liver. 4. Mild bibasilar atelectasis noted. 5. Chronic compression deformity of vertebral body T7, with diffuse osteopenia of visualized osseous structures Electronically Signed   By: Roanna Raider M.D.   On: 04/28/2015 02:22   [place] Art/ven Flow Abd Pelv Doppler  04/28/2015  CLINICAL DATA:  Subacute onset of scrotal swelling, status post traumatic injury. Initial encounter. EXAM: SCROTAL ULTRASOUND DOPPLER ULTRASOUND OF THE TESTICLES TECHNIQUE: Complete ultrasound examination of the  testicles, epididymis, and other scrotal structures was performed. Color and spectral Doppler ultrasound were also utilized to evaluate blood flow to the testicles. COMPARISON:  CT of the abdomen and pelvis performed earlier today at 1:06 a.m. FINDINGS: Right testicle Measurements: 2.5 x 1.5 x 2.0 cm. No mass or microlithiasis visualized. Left testicle Measurements: 2.3 x 1.6 x 1.8 cm. No mass or microlithiasis visualized. Right epididymis:  Not visualized. Left epididymis:  Not visualized. Hydrocele:  None visualized. Varicocele:  None visualized. Pulsed Doppler interrogation of both testes demonstrates normal low resistance arterial and venous waveforms  bilaterally. Vague soft tissue edema is noted at the scrotum. There is also extensive fat about the scrotal wall. IMPRESSION: 1. No evidence of testicular torsion. 2. Mild vague soft tissue edema noted at the scrotum. Electronically Signed   By: Roanna Raider M.D.   On: 04/28/2015 07:14   Dg Hand Complete Right  04/28/2015  CLINICAL DATA:  Fall 1 week ago. Right hand pain and decreased range of motion. Initial encounter. EXAM: RIGHT HAND - COMPLETE 3+ VIEW COMPARISON:  None. FINDINGS: Mildly displaced fractures are seen involving the proximal volar margins of the proximal and middle phalanges of the ring finger. No evidence of dislocation. No other fractures identified. IMPRESSION: Volar plate avulsion fractures involving the proximal and middle phalanges of the ring finger. Electronically Signed   By: Myles Rosenthal M.D.   On: 04/28/2015 14:32   Dg Hip Unilat With Pelvis 2-3 Views Left  04/28/2015  CLINICAL DATA:  Pain EXAM: DG HIP (WITH OR WITHOUT PELVIS) 2-3V LEFT COMPARISON:  None FINDINGS: Retained contrast in a distended bladder. Diffuse osseous demineralization. Hip joint spaces preserved. Scattered soft tissue and stool artifacts. No definite fracture or bone destruction. IMPRESSION: Osseous demineralization without definite acute bony abnormalities. Electronically Signed   By: Ulyses Southward M.D.   On: 04/28/2015 14:30    Scheduled Meds: . calcium-vitamin D  2 tablet Oral TID AC & HS  .  ceFAZolin (ANCEF) IV  2 g Intravenous 3 times per day  . citalopram  10 mg Oral Daily  . clotrimazole   Topical BID  . docusate sodium  100 mg Oral Daily  . DULoxetine  20 mg Oral Daily  . enoxaparin (LOVENOX) injection  40 mg Subcutaneous Q24H  . ferrous sulfate  325 mg Oral BID WC  . folic acid  1 mg Oral Daily  . levothyroxine  225 mcg Oral QAC breakfast  . Melatonin  6 mg Oral QHS  . mirtazapine  7.5 mg Oral QHS  . morphine  15 mg Oral BID  . pantoprazole  40 mg Oral Daily  . torsemide  30 mg Oral Daily   . [START ON 04/30/2015] Vitamin D (Ergocalciferol)  50,000 Units Oral Q Mon   Continuous Infusions:   Time spent: 25 minutes  [name]  [facility] [email or web address], password [facility] 04/29/2015, 9:42 AM  LOS: 1 day

## 2015-04-29 NOTE — Progress Notes (Signed)
Inpatient Rehabilitation  Patient was screened by Phillip Maffei for appropriateness for an Inpatient Acute Rehab consult at the request of PT.   At this time, we are recommending Inpatient Rehab consult.  Please order consult if you are agreeable.  Nyjah Denio PT Inpatient Rehab Admissions Coordinator Cell [phone] Office [phone]   

## 2015-04-29 NOTE — Progress Notes (Signed)
Orthopedic Tech Progress Note Patient Details:  Despina HickKevin Dombkowski [birth year] [id number]  Ortho Devices Type of Ortho Device: Finger splint Ortho Device/Splint Location: rue Ortho Device/Splint Interventions: Application   Nikki DomCrawford, Angeletta Goelz 04/29/2015, 12:21 PM

## 2015-04-30 ENCOUNTER — Inpatient Hospital Stay (HOSPITAL_COMMUNITY): Payer: 59

## 2015-04-30 DIAGNOSIS — I83029 Varicose veins of left lower extremity with ulcer of unspecified site: Secondary | ICD-10-CM

## 2015-04-30 DIAGNOSIS — G894 Chronic pain syndrome: Secondary | ICD-10-CM | POA: Insufficient documentation

## 2015-04-30 DIAGNOSIS — R609 Edema, unspecified: Secondary | ICD-10-CM | POA: Insufficient documentation

## 2015-04-30 DIAGNOSIS — L97909 Non-pressure chronic ulcer of unspecified part of unspecified lower leg with unspecified severity: Secondary | ICD-10-CM

## 2015-04-30 DIAGNOSIS — L03116 Cellulitis of left lower limb: Secondary | ICD-10-CM

## 2015-04-30 DIAGNOSIS — Z89521 Acquired absence of right knee: Secondary | ICD-10-CM

## 2015-04-30 DIAGNOSIS — S62609A Fracture of unspecified phalanx of unspecified finger, initial encounter for closed fracture: Secondary | ICD-10-CM | POA: Insufficient documentation

## 2015-04-30 DIAGNOSIS — S62609D Fracture of unspecified phalanx of unspecified finger, subsequent encounter for fracture with routine healing: Secondary | ICD-10-CM

## 2015-04-30 DIAGNOSIS — D509 Iron deficiency anemia, unspecified: Secondary | ICD-10-CM

## 2015-04-30 DIAGNOSIS — Z9884 Bariatric surgery status: Secondary | ICD-10-CM

## 2015-04-30 DIAGNOSIS — W19XXXD Unspecified fall, subsequent encounter: Secondary | ICD-10-CM

## 2015-04-30 DIAGNOSIS — E118 Type 2 diabetes mellitus with unspecified complications: Secondary | ICD-10-CM

## 2015-04-30 DIAGNOSIS — I83009 Varicose veins of unspecified lower extremity with ulcer of unspecified site: Secondary | ICD-10-CM | POA: Insufficient documentation

## 2015-04-30 DIAGNOSIS — N492 Inflammatory disorders of scrotum: Principal | ICD-10-CM

## 2015-04-30 DIAGNOSIS — I10 Essential (primary) hypertension: Secondary | ICD-10-CM | POA: Insufficient documentation

## 2015-04-30 DIAGNOSIS — R29898 Other symptoms and signs involving the musculoskeletal system: Secondary | ICD-10-CM

## 2015-04-30 LAB — HEMOGLOBIN A1C
HEMOGLOBIN A1C: 5.1 % (ref 4.8–5.6)
MEAN PLASMA GLUCOSE: 100 mg/dL

## 2015-04-30 LAB — VITAMIN B1: Vitamin B1 (Thiamine): 151.2 nmol/L (ref 66.5–200.0)

## 2015-04-30 MED ORDER — TORSEMIDE 20 MG PO TABS
60.0000 mg | ORAL_TABLET | Freq: Every day | ORAL | Status: DC
  Administered 2015-05-01: 60 mg via ORAL
  Filled 2015-04-30: qty 3

## 2015-04-30 MED ORDER — HYDROMORPHONE HCL 1 MG/ML IJ SOLN
1.0000 mg | Freq: Once | INTRAMUSCULAR | Status: AC
  Administered 2015-04-30: 1 mg via INTRAVENOUS
  Filled 2015-04-30: qty 1

## 2015-04-30 MED ORDER — CEPHALEXIN 500 MG PO CAPS
500.0000 mg | ORAL_CAPSULE | Freq: Four times a day (QID) | ORAL | Status: DC
  Administered 2015-04-30 – 2015-05-01 (×5): 500 mg via ORAL
  Filled 2015-04-30 (×5): qty 1

## 2015-04-30 NOTE — Progress Notes (Signed)
Physical Therapy Treatment Patient Details Name: [name] MRN: [id number] DOB: [birth year] Today's Date: 04/30/2015    History of Present Illness 55 y.o. female with history of depression, type 2 diabetes which resolved with bariatric surgery, history of bariatric surgery, hypertension, venous stasis ulcers to the left lower extremity, vitamin A and D deficiency, protein calorie malnuitrition who presents with a 1-2 week history of worsening weakness to the point whereby patient is unable to get out of his wheelchair, skin breakdown in this sacral/buttocks region, scrotal swelling with some erythema.    PT Comments    Pt performed increased mobility with sit to stand during use of stedy for support.  Pt required increased time to complete standing trial due to unsuccessful attempts without R prosthesis.  With prosthesis sit to stand trials improved.    Follow Up Recommendations  CIR     Equipment Recommendations  None recommended by PT    Recommendations for Other Services Rehab consult     Precautions / Restrictions Precautions Precautions: Fall Precaution Comments: Hx of Rt knee BKA Restrictions Weight Bearing Restrictions: No    Mobility  Bed Mobility Overal bed mobility: Needs Assistance;+2 for physical assistance Bed Mobility: Supine to Sit;Rolling Rolling: Min assist;+2 for physical assistance   Supine to sit: Mod assist;+2 for physical assistance;HOB elevated   Sit to sidelying: Min assist General bed mobility comments: Mod assist +2 for truncal support and LLE support. Supervision needed for scooting forward on bed.   Transfers Overall transfer level: Needs assistance Equipment used: None Transfers: Sit to/from Stand     Squat pivot transfers: +2 physical assistance;From elevated surface;Mod assist     General transfer comment: Attempted sit to stand without prosthesis, this was unsuccesful. Pt able to perform sit to stand with prosthesis with multimodal cueing  and mod assist +2; used bed pad to help boost into standing.   Ambulation/Gait Ambulation/Gait assistance:  (remains unable as pt fatigues quickly with stading trial to stedy.  )               Stairs            Wheelchair Mobility    Modified Rankin (Stroke Patients Only)       Balance Overall balance assessment: Needs assistance;History of Falls Sitting-balance support: No upper extremity supported;Single extremity supported Sitting balance-Leahy Scale: Good         Standing balance comment: fair balance in stedy lift equipment.                      Cognition Arousal/Alertness: Awake/alert Behavior During Therapy: WFL for tasks assessed/performed Overall Cognitive Status: Within Functional Limits for tasks assessed                      Exercises      General Comments        Pertinent Vitals/Pain Pain Assessment: 0-10 Pain Score: 8  Pain Location: groin and B hips.  Report pain on buttocks during perianal care.   Pain Descriptors / Indicators: Grimacing;Guarding;Aching Pain Intervention(s): Repositioned;Limited activity within patient's tolerance (Pt very slow to move secondary to pain.  )    Home Living Family/patient expects to be discharged to:: Private residence Living Arrangements: Alone Available Help at Discharge: Personal care attendant (3X per week for up to 6 hours per day) Type of Home: Apartment Home Access: Level entry   Home Layout: One level Home Equipment: Wheelchair - power;Cane - single point;Walker - 2 wheels;Walker -  4 wheels;Bedside commode;Tub bench      Prior Function Level of Independence: Independent with assistive device(s)      Comments: Primarily power w/c, walking short distances and driving.   PT Goals (current goals can now be found in the care plan section) Acute Rehab PT Goals Patient Stated Goal: Regain my independence. Go to rehab Potential to Achieve Goals: Good Progress towards PT goals:  Progressing toward goals    Frequency  Min 3X/week    PT Plan      Co-evaluation   Reason for Co-Treatment: For patient/therapist safety;Complexity of the patient's impairments (multi-system involvement)   OT goals addressed during session: ADL's and self-care;Strengthening/ROM     End of Session Equipment Utilized During Treatment: Gait belt Activity Tolerance: Patient limited by fatigue;Patient limited by pain Patient left: in bed;with call bell/phone within reach;with family/visitor present     Time: [id number] PT Time Calculation (min) (ACUTE ONLY): 43 min  Charges:  $Therapeutic Activity: 8-22 mins                    G Codes:      Florestine Aversimee J Tayari Yankee 04/30/2015, 1:00 PM Joycelyn RuaAimee Sandrine Bloodsworth, PTA pager [phone]

## 2015-04-30 NOTE — NC FL2 (Signed)
[place] MEDICAID FL2 LEVEL OF CARE SCREENING TOOL     IDENTIFICATION  Patient Name: Zenola Dezarn Birthdate: 06/01/1960 Sex: female Admission Date (Current Location): 04/27/2015  [facility] and [place] Number:  Producer, television/film/video and Address:  [facility], [address], [place], [place] [address]      Provider Number: [id number]  Attending Physician Name and Address:  Christiane Ha, MD  Relative Name and Phone Number:       Current Level of Care: Hospital Recommended Level of Care: Skilled Nursing Facility Prior Approval Number:    Date Approved/Denied:   PASRR Number: [id number]  Discharge Plan: SNF    Current Diagnoses: Patient Active Problem List   Diagnosis Date Noted  . Chronic edema   . Finger fracture, right   . Benign essential HTN   . Chronic pain syndrome   . Venous stasis ulcer (HCC)   . Cellulitis 04/28/2015  . Anemia 04/28/2015  . Weakness generalized 04/28/2015  . Hypokalemia 04/28/2015  . Pressure ulcer of buttock, unstageable (HCC) 04/28/2015  . Scrotal swelling 04/28/2015  . Fall 04/28/2015  . Protein-calorie malnutrition (HCC) 04/28/2015  . Weakness 04/28/2015  . Cellulitis, scrotum   . Failure to thrive in adult   . Fungal dermatitis   . Generalized weakness   . Muscular deconditioning   . Scrotal edema   . Swelling of scrotum   . Hypothyroidism 01/17/2015  . Anemia, iron deficiency 01/17/2015  . Vitamin D deficiency 01/17/2015  . S/P bariatric surgery 01/17/2015  . DM type 2 (diabetes mellitus, type 2) (HCC) 01/17/2015  . Venous stasis ulcer of left lower extremity (HCC) 12/14/2013  . Chronic pain 07/10/2013  . 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 07/09/2013    Orientation RESPIRATION BLADDER Height & Weight     Self, Time, Situation, Place  Normal Continent Weight: 239 lb 6.7 oz (108.6 kg) Height:  6' (182.9 cm)  BEHAVIORAL SYMPTOMS/MOOD NEUROLOGICAL BOWEL NUTRITION STATUS      Continent  Diet (regular)  AMBULATORY STATUS COMMUNICATION OF NEEDS Skin   Extensive Assist Verbally PU Stage and Appropriate Care   PU Stage 2 Dressing:  (PRN)                   Personal Care Assistance Level of Assistance  Bathing, Dressing Bathing Assistance: Maximum assistance   Dressing Assistance: Maximum assistance     Functional Limitations Info             SPECIAL CARE FACTORS FREQUENCY  PT (By licensed PT), OT (By licensed OT)     PT Frequency: 5/wk OT Frequency: 5/wk            Contractures      Additional Factors Info  Code Status, Allergies, Psychotropic Code Status Info: FULL Allergies Info: NKA Psychotropic Info: cymbalta         Current Medications (04/30/2015):  This is the current hospital active medication list Current Facility-Administered Medications  Medication Dose Route Frequency Provider Last Rate Last Dose  . acetaminophen (TYLENOL) tablet 650 mg  650 mg Oral Q6H PRN Rodolph Bong, MD       Or  . acetaminophen (TYLENOL) suppository 650 mg  650 mg Rectal Q6H PRN Rodolph Bong, MD      . alum & mag hydroxide-simeth (MAALOX/MYLANTA) 200-200-20 MG/5ML suspension 30 mL  30 mL Oral Q6H PRN Rodolph Bong, MD      . calcium-vitamin D (OSCAL WITH D)  500-200 MG-UNIT per tablet 2 tablet  2 tablet Oral TID AC & HS Rodolph Bonganiel Thompson V, MD   2 tablet at 04/30/15 1229  . citalopram (CELEXA) tablet 10 mg  10 mg Oral Daily Rodolph Bonganiel Thompson V, MD   10 mg at 04/30/15 0930  . clotrimazole (LOTRIMIN) 1 % cream   Topical BID Ramiro Harvestaniel Thompson V, MD      . docusate sodium (COLACE) capsule 100 mg  100 mg Oral Daily Rodolph Bonganiel Thompson V, MD   100 mg at 04/30/15 0930  . DULoxetine (CYMBALTA) DR capsule 20 mg  20 mg Oral Daily Rodolph Bonganiel Thompson V, MD   20 mg at 04/30/15 0931  . enoxaparin (LOVENOX) injection 40 mg  40 mg Subcutaneous Q24H Rodolph Bonganiel Thompson V, MD   40 mg at 04/30/15 0931  . ferrous sulfate tablet 325 mg  325 mg Oral BID WC Rodolph Bonganiel Thompson V, MD   325 mg at  04/30/15 [id number]  . folic acid (FOLVITE) tablet 1 mg  1 mg Oral Daily Rodolph Bonganiel Thompson V, MD   1 mg at 04/30/15 0930  . levothyroxine (SYNTHROID, LEVOTHROID) tablet 225 mcg  225 mcg Oral QAC breakfast Rodolph Bonganiel Thompson V, MD   225 mcg at 04/30/15 [phone]  . Melatonin TABS 6 mg  6 mg Oral QHS Rodolph Bonganiel Thompson V, MD   6 mg at 04/29/15 2209  . mirtazapine (REMERON) tablet 7.5 mg  7.5 mg Oral QHS Rodolph Bonganiel Thompson V, MD   7.5 mg at 04/29/15 2202  . morphine (MS CONTIN) 12 hr tablet 15 mg  15 mg Oral BID Rodolph Bonganiel Thompson V, MD   15 mg at 04/30/15 0930  . ondansetron (ZOFRAN) tablet 4 mg  4 mg Oral Q6H PRN Rodolph Bonganiel Thompson V, MD       Or  . ondansetron [facility](ZOFRAN) injection 4 mg  4 mg Intravenous Q6H PRN Rodolph Bonganiel Thompson V, MD      . oxyCODONE (Oxy IR/ROXICODONE) immediate release tablet 10 mg  10 mg Oral Q4H PRN Christiane Haorinna L Sullivan, MD   10 mg at 04/30/15 1057  . pantoprazole (PROTONIX) EC tablet 40 mg  40 mg Oral Daily Rodolph Bonganiel Thompson V, MD   40 mg at 04/30/15 0930  . senna-docusate (Senokot-S) tablet 1 tablet  1 tablet Oral QHS PRN Rodolph Bonganiel Thompson V, MD      . sodium phosphate (FLEET) 7-19 GM/118ML enema 1 enema  1 enema Rectal Once PRN Rodolph Bonganiel Thompson V, MD      . sorbitol 70 % solution 30 mL  30 mL Oral Daily PRN Rodolph Bonganiel Thompson V, MD      . Melene Muller[START ON 05/01/2015] torsemide (DEMADEX) tablet 60 mg  60 mg Oral Daily Christiane Haorinna L Sullivan, MD      . Vitamin D (Ergocalciferol) (DRISDOL) capsule 50,000 Units  50,000 Units Oral Q Mon Rodolph Bonganiel Thompson V, MD   50,000 Units at 04/30/15 [id number]     Discharge Medications: Please see discharge summary for a list of discharge medications.  Relevant Imaging Results:  Relevant Lab Results:   Additional Information SS#: [id number]  Izora RibasHoloman, Izabelle Daus M, [place]

## 2015-04-30 NOTE — Progress Notes (Signed)
[facility] PROGRESS NOTE  [name] WUJ:[id number] DOB: 1960-10-05 DOA: 04/27/2015 PCP: Soundra Pilon with [facility]  Assessment/Plan:  Principal Problem:   Scrotal Cellulitis: improved. Change to po abx. Much edema remains in legs, abdomen and scrotum. Will increase torsemide to 60 mg Active Problems:   Hypothyroidism   Anemia, iron deficiency   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   Chronic pain    Venous stasis ulcer of left lower extremity (HCC): awaiting WOC   Vitamin D deficiency   S/P bariatric surgery   DM type 2 (diabetes mellitus, type 2) (HCC) controlled   Weakness generalized: awaiting PT eval. Might need placement. Lives alone   Hypokalemia corrected   Pressure ulcer of buttock, unstageable (HCC): Per RN, stage 2, no infection   Fall 2 weeks ago: [facility] SNF Right hip pain xray and CT without fx, just arthritis   Protein-calorie malnutrition (HCC) Finger fracture: CT completed. Appreciate Dr. Merrilee Seashore assistance  HPI/Subjective: C/o right hip pain.  Objective: Filed Vitals:   04/30/15 0454 04/30/15 1414  BP:  100/56  Pulse:  76  Temp: 98.3 F (36.8 C) 98.6 F (37 C)  Resp:  19    Intake/Output Summary (Last 24 hours) at 04/30/15 1458 Last data filed at 04/30/15 1115  Gross per 24 hour  Intake    452 ml  Output   1300 ml  Net   -848 ml   Filed Weights   04/28/15 0436 04/30/15 0454  Weight: 104.9 kg (231 lb 4.2 oz) 108.6 kg (239 lb 6.7 oz)    Exam:   General:  Nontoxic. A and o, cooperative  Cardiovascular: RRR without MGR  Respiratory: CTA without   Abdomen: s, nt, nd  GU: cellulitis of scrotum improving  Ext: left LE with erythema, yellow crust and drainage, ulcers with white exudate. Per patient chronic for >1 year. Unable to extend right ring finger fully. No tenderness. Right hip with full ROM, no tenderness. edema  Basic Metabolic Panel:  Recent Labs Lab 04/27/15 1702 04/28/15 0750  04/29/15 0620  NA 139 139 140  K 3.4* 3.2* 3.9  CL 98* 103 106  CO2 GLUCOSE 118* 133* 102*  BUN CREATININE 1.08 0.82 0.79  CALCIUM 8.1* 7.3* 7.3*  MG  --  1.6*  --   PHOS  --  2.2*  --    Liver Function Tests:  Recent Labs Lab 04/28/15 0750  AST 20  ALT 15*  ALKPHOS 213*  BILITOT 0.7  PROT 5.6*  ALBUMIN 2.2*   No results for input(s): LIPASE, AMYLASE in the last 168 hours. No results for input(s): AMMONIA in the last 168 hours. CBC:  Recent Labs Lab 04/27/15 1702 04/28/15 0750 04/29/15 0620  WBC 7.9 6.9  --   NEUTROABS  --  5.2  --   HGB 10.4* 8.5* 8.4*  HCT 32.7* 28.1* 27.4*  MCV 94.0 93.0  --   PLT 361 308  --    Cardiac Enzymes:  Recent Labs Lab 04/28/15 1426  CKTOTAL 87   BNP (last 3 results) No results for input(s): BNP in the last 8760 hours.  ProBNP (last 3 results) No results for input(s): PROBNP in the last 8760 hours.  CBG:  Recent Labs Lab 04/27/15 1656  GLUCAP 101*    No results found for this or any previous visit (from the past 240 hour(s)).   Studies: Ct Finger Right Wo Contrast  04/30/2015  CLINICAL DATA:  Volar plate avulsion injury of the right hand. EXAM: CT OF THE RIGHT FINGERS WITHOUT CONTRAST TECHNIQUE: Multidetector CT imaging was performed according to the standard protocol. Multiplanar CT image reconstructions were also generated. COMPARISON:  None. FINDINGS: There is a comminuted fracture of the volar base of the fourth distal phalanx. There is a sliver of bone along the volar base of the fourth middle phalanx without a donor site suggesting an avulsed fragment from the fourth distal phalanx with 2.4 cm of retraction of the flexor digitorum profundus tendon. There is no other fracture or dislocation. There is no lytic or sclerotic osseous lesion. There is no fluid collection or hematoma. IMPRESSION: Comminuted fracture of the volar base of the fourth distal phalanx. Sliver of bone along the volar base of  the fourth middle phalanx without a donor site suggesting an avulsed fragment from the fourth distal phalanx with 2.4 cm of retraction of the flexor digitorum profundus tendon. Electronically Signed   By: Elige KoHetal  Patel   On: 04/30/2015 14:48    Scheduled Meds: . calcium-vitamin D  2 tablet Oral TID AC & HS  .  ceFAZolin (ANCEF) IV  2 g Intravenous 3 times per day  . citalopram  10 mg Oral Daily  . clotrimazole   Topical BID  . docusate sodium  100 mg Oral Daily  . DULoxetine  20 mg Oral Daily  . enoxaparin (LOVENOX) injection  40 mg Subcutaneous Q24H  . ferrous sulfate  325 mg Oral BID WC  . folic acid  1 mg Oral Daily  . levothyroxine  225 mcg Oral QAC breakfast  . Melatonin  6 mg Oral QHS  . mirtazapine  7.5 mg Oral QHS  . morphine  15 mg Oral BID  . pantoprazole  40 mg Oral Daily  . torsemide  30 mg Oral Daily  . Vitamin D (Ergocalciferol)  50,000 Units Oral Q Mon   Continuous Infusions:   Time spent: 25 minutes  Endy Easterly L  [facility] [email or web address], password [email or web address] 04/30/2015, 2:58 PM  LOS: 2 days

## 2015-04-30 NOTE — Clinical Social Work Note (Signed)
Clinical Social Work Assessment  Patient Details  Name: [name] MRN: [id number] Date of Birth: [birth year]  Date of referral:  04/30/15               Reason for consult:  Facility Placement                Permission sought to share information with:  Facility Industrial/product designerContact Representative Permission granted to share information::  Yes, Verbal Permission Granted  Name::        Agency::  SNF  Relationship::     Contact Information:     Housing/Transportation Living arrangements for the past 2 months:  Single Family Home Source of Information:  Patient Patient Interpreter Needed:  None Criminal Activity/Legal Involvement Pertinent to Current Situation/Hospitalization:  No - Comment as needed Significant Relationships:  Siblings Lives with:  Self Do you feel safe going back to the place where you live?  No Need for family participation in patient care:  No (Coment)  Care giving concerns:  Pt lives alone but has caregiver support 3/wk   Office managerocial Worker assessment / plan: CSW consulted for SNF placement since CIR does not think pt is appropriate candidate for inpatient program.  CSW discussed next recommended level of care which is SNF.  Pt reports having been to SNF in the past at [facility] and understands the process.  Employment status:    Health and safety inspectornsurance information:  Managed Care PT Recommendations:  Inpatient Rehab Consult Information / Referral to community resources:  Skilled Nursing Facility  Patient/Family's Response to care:  Pt is agreeable to SNF placement for short term rehab but wouldve preferred to go to inpatient program  Patient/Family's Understanding of and Emotional Response to Diagnosis, Current Treatment, and Prognosis:  Pt did not have current questions or concerns about current treatment/prognosis- pt was agreeable to whatever is being recommended- pt is hopeful that he will not have to stay in rehab long and greatly values living independently  Emotional  Assessment Appearance:  Appears stated age Attitude/Demeanor/Rapport:    Affect (typically observed):  Accepting, Pleasant Orientation:  Oriented to Self, Oriented to Place, Oriented to  Time, Oriented to Situation Alcohol / Substance use:  Not Applicable Psych involvement (Current and /or in the community):  No (Comment)  Discharge Needs  Concerns to be addressed:  Home Safety Concerns Readmission within the last 30 days:  No Current discharge risk:  Physical Impairment, Lives alone Barriers to Discharge:  Continued Medical Work up   Peabody EnergyHoloman, Allison Baskins M, LCSW 04/30/2015, 5:01 PM

## 2015-04-30 NOTE — Consult Note (Signed)
Physical Medicine and Rehabilitation Consult Reason for Consult: Debilitation/scrotal cellulitis/multi-medical Referring Physician: [facility]  HPI: [name] is a 55 y.o. right handed female with history of type 2 diabetes mellitus, bariatric surgery[facility] [place], hypertension, venous stasis ulcers left lower extremity. Patient lives alone and has a personal care attendant 3 times a week for 3-6 hours on each visit. He does have a right lower extremity prosthesis. Used a walker in a power wheelchair prior to admission. Presented 04/28/2015 with 1-2 week history of increasing diffuse weakness, skin breakdown to the sacral buttocks region and scrotal swelling with erythema as well as recent fall. CT of the abdomen and pelvis showed diffuse soft tissue inflammation and edema about the abdominal and pelvic wall. Ultrasound of the scrotum showed no evidence of testicular torsion. Placed empirically on intravenous Ancef. X-rays of her right hand due to increasing pain after recent fall showed right ring finger fractures of volar plate and distal phalanx. Dr. Merlyn LotKuzma of orthopedic services consulted with CT finger right hand pending and await further recommendations. Subcutaneous Lovenox for DVT prophylaxis. Acute on chronic anemia 8.4-10.4 and monitored. Physical therapy evaluation completed 04/29/2015 with recommendations of physical medicine rehabilitation consult.  Review of Systems  Constitutional: Negative for fever and chills.  HENT: Negative for hearing loss.   Eyes: Negative for double vision.  Respiratory: Negative for cough and shortness of breath.   Cardiovascular: Positive for leg swelling. Negative for chest pain and palpitations.  Gastrointestinal: Negative for nausea and vomiting.  Genitourinary: Positive for urgency. Negative for hematuria.  Musculoskeletal: Positive for myalgias, joint pain and falls.  Skin: Negative for rash.  Neurological: Positive for  weakness. Negative for seizures and headaches.  Psychiatric/Behavioral: Positive for depression.  All other systems reviewed and are negative.  Past Medical History  Diagnosis Date  . Chicken pox   . Depression   . DM type 2 (diabetes mellitus, type 2) (HCC) 2006    Essentially resolved following bariatric surgery  . Venous stasis   . HTN (hypertension)     History of; resolved following bariatric surgery  . History of sleep apnea   . History of malnutrition   . History of vitamin A deficiency    Past Surgical History  Procedure Laterality Date  . Cholecystectomy  1991  . Appendectomy  1991  . Tonsillectomy and adenoidectomy  1967  . Severe malnutrition  2014  . Below knee leg amputation Right 2014  . Hernia repair  2009  . Gastric sleeve, biliopancreatic diversion, duodenal switch  2006   Family History  Problem Relation Age of Onset  . Arthritis Mother   . Diabetes Mother    Social History:  reports that he has never smoked. He has never used smokeless tobacco. He reports that he does not drink alcohol or use illicit drugs. Allergies: No Known Allergies Medications Prior to Admission  Medication Sig Dispense Refill  . citalopram (CELEXA) 10 MG tablet Take 10 mg by mouth daily.     Marland Kitchen. docusate sodium (COLACE) 100 MG capsule Take 100 mg by mouth daily.    . DULoxetine (CYMBALTA) 20 MG capsule Take 20 mg by mouth daily.     . ferrous sulfate 325 (65 FE) MG tablet Take 325 mg by mouth 2 (two) times daily.     . folic acid (FOLVITE) 1 MG tablet Take 1 mg by mouth daily.     Marland Kitchen. levothyroxine (SYNTHROID, LEVOTHROID) 200 MCG tablet TAKE  ONE TABLET BY MOUTH DAILY 90 tablet 1  . Melatonin 5 MG TABS Take 5 mg by mouth at bedtime.    . mirtazapine (REMERON) 7.5 MG tablet Take 7.5 mg by mouth at bedtime.     Marland Kitchen morphine (MS CONTIN) 15 MG 12 hr tablet Take 15 mg by mouth 2 (two) times daily.    . Oxycodone HCl 10 MG TABS Take 1 tablet (10 mg total) by mouth every 8 (eight) hours as  needed. (Patient taking differently: Take 10 mg by mouth every 8 (eight) hours as needed (for breakthrough pain). ) 60 tablet 0  . pantoprazole (PROTONIX) 40 MG tablet Take 40 mg by mouth daily.     Marland Kitchen torsemide (DEMADEX) 20 MG tablet Take 30 mg by mouth daily. Currently takes 1.5 tabs daily    . Vitamin D, Ergocalciferol, (DRISDOL) 50000 UNITS CAPS capsule Take 1 capsule (50,000 Units total) by mouth daily. (Patient taking differently: Take 50,000 Units by mouth every Monday. ) 30 capsule 0  . levothyroxine (SYNTHROID, LEVOTHROID) 25 MCG tablet Take 25 mcg by mouth daily before breakfast.      Home: Home Living Family/patient expects to be discharged to:: Private residence Living Arrangements: Alone Available Help at Discharge: Personal care attendant (3x/week) Type of Home: Apartment Home Access: Level entry Home Layout: One level Bathroom Accessibility: Yes Home Equipment: Wheelchair - power, Medical laboratory scientific officer - single point, Environmental consultant - 2 wheels, Walker - 4 wheels, Bedside commode, Tub bench (RLE prosthesis)  Functional History: Prior Function Level of Independence: Independent with assistive device(s) Comments: Primarily power w/c, walking short distances and driving. Functional Status:  Mobility: Bed Mobility Overal bed mobility: Needs Assistance, +2 for physical assistance Bed Mobility: Supine to Sit, Rolling, Sit to Sidelying Rolling: Min assist Supine to sit: Mod assist, +2 for physical assistance, HOB elevated Sit to sidelying: Min assist General bed mobility comments: Mod assist +2 for truncal and LE support to rise to EOB using bed pad to scoot and reduce friction across buttocks. Min assist for sit to sidelying with LLE support back into bed. Practiced rolling to Lt and Rt side with min assist and cues to use rail as needed. Transfers Overall transfer level: Needs assistance Equipment used: None Transfers: Squat Pivot Transfers Squat pivot transfers: +2 physical assistance, From  elevated surface, Max assist General transfer comment: Attempted squat pivot transfer to powerchair from bed. Pt too weak to complete safely. Demonstrates little LLE use to lift buttocks, assisting with bed pad to reduce friction. Limited RUE use due to finger splinting and guarding. Demonstrates good effort but unable to safely perform at this time. Made it nearly half way into chair before too fatigued to continue. Required max assist to scoot back into bed.      ADL:    Cognition: Cognition Overall Cognitive Status: Within Functional Limits for tasks assessed Cognition Arousal/Alertness: Awake/alert Behavior During Therapy: WFL for tasks assessed/performed Overall Cognitive Status: Within Functional Limits for tasks assessed  Blood pressure 100/58, pulse 77, temperature 98.3 F (36.8 C), temperature source Oral, resp. rate 18, height 6' (1.829 m), weight 108.6 kg (239 lb 6.7 oz), SpO2 97 %. Physical Exam  Vitals reviewed. Constitutional: He is oriented to person, place, and time. He appears well-developed and well-nourished.  HENT:  Head: Normocephalic and atraumatic.  Eyes: Conjunctivae and EOM are normal.  Neck: Normal range of motion. Neck supple. No thyromegaly present.  Cardiovascular: Normal rate and regular rhythm.   Respiratory: Effort normal and breath sounds normal. No  respiratory distress.  GI: Soft. Bowel sounds are normal. He exhibits no distension.  Genitourinary:  Scrotal edema  Musculoskeletal: He exhibits edema and tenderness (Right hand).  Right finger splint  Neurological: He is alert and oriented to person, place, and time.  Motor: Bilateral upper extremities 5/5, except for right finger (splinted) Left left hip flexion, knee extension 2/5, ankle dorsi/plantar flexion 4/5 Right hip flexion 3 -/5  Skin:  Right BKA site is clean and dry.  Left lower extremity cellulitic changes with skin breakdown and dressing in place C/D/I.  Psychiatric: He has a normal  mood and affect. His behavior is normal.    No results found for this or any previous visit (from the past 24 hour(s)). US Scrotum  04/28/2015  CLINICAL DATA:  Subacute onset of scrotal swelling, status post traumatic injury. Initial encounter. EXAM: SCROTAL ULTRASOUND DOPPLER ULTRASOUND OF THE TESTICLES TECHNIQUE: Complete ultrasound examination of the testicles, epididymis, and other scrotal structures was performed. Color and spectral Doppler ultrasound were also utilized to evaluate blood flow to the testicles. COMPARISON:  CT of the abdomen and pelvis performed earlier today at 1:06 a.m. FINDINGS: Right testicle Measurements: 2.5 x 1.5 x 2.0 cm. No mass or microlithiasis visualized. Left testicle Measurements: 2.3 x 1.6 x 1.8 cm. No mass or microlithiasis visualized. Right epididymis:  Not visualized. Left epididymis:  Not visualized. Hydrocele:  None visualized. Varicocele:  None visualized. Pulsed Doppler interrogation of both testes demonstrates normal low resistance arterial and venous waveforms bilaterally. Vague soft tissue edema is noted at the scrotum. There is also extensive fat about the scrotal wall. IMPRESSION: 1. No evidence of testicular torsion. 2. Mild vague soft tissue edema noted at the scrotum. Electronically Signed   By: Roanna Raider M.D.   On: 04/28/2015 07:14   [place] Art/ven Flow Abd Pelv Doppler  04/28/2015  CLINICAL DATA:  Subacute onset of scrotal swelling, status post traumatic injury. Initial encounter. EXAM: SCROTAL ULTRASOUND DOPPLER ULTRASOUND OF THE TESTICLES TECHNIQUE: Complete ultrasound examination of the testicles, epididymis, and other scrotal structures was performed. Color and spectral Doppler ultrasound were also utilized to evaluate blood flow to the testicles. COMPARISON:  CT of the abdomen and pelvis performed earlier today at 1:06 a.m. FINDINGS: Right testicle Measurements: 2.5 x 1.5 x 2.0 cm. No mass or microlithiasis visualized. Left testicle Measurements: 2.3 x  1.6 x 1.8 cm. No mass or microlithiasis visualized. Right epididymis:  Not visualized. Left epididymis:  Not visualized. Hydrocele:  None visualized. Varicocele:  None visualized. Pulsed Doppler interrogation of both testes demonstrates normal low resistance arterial and venous waveforms bilaterally. Vague soft tissue edema is noted at the scrotum. There is also extensive fat about the scrotal wall. IMPRESSION: 1. No evidence of testicular torsion. 2. Mild vague soft tissue edema noted at the scrotum. Electronically Signed   By: Roanna Raider M.D.   On: 04/28/2015 07:14   Dg Hand Complete Right  04/28/2015  CLINICAL DATA:  Fall 1 week ago. Right hand pain and decreased range of motion. Initial encounter. EXAM: RIGHT HAND - COMPLETE 3+ VIEW COMPARISON:  None. FINDINGS: Mildly displaced fractures are seen involving the proximal volar margins of the proximal and middle phalanges of the ring finger. No evidence of dislocation. No other fractures identified. IMPRESSION: Volar plate avulsion fractures involving the proximal and middle phalanges of the ring finger. Electronically Signed   By: Myles Rosenthal M.D.   On: 04/28/2015 14:32   Dg Hip Unilat With Pelvis 2-3 Views Left  04/28/2015  CLINICAL DATA:  Pain EXAM: DG HIP (WITH OR WITHOUT PELVIS) 2-3V LEFT COMPARISON:  None FINDINGS: Retained contrast in a distended bladder. Diffuse osseous demineralization. Hip joint spaces preserved. Scattered soft tissue and stool artifacts. No definite fracture or bone destruction. IMPRESSION: Osseous demineralization without definite acute bony abnormalities. Electronically Signed   By: Ulyses Southward M.D.   On: 04/28/2015 14:30    Assessment/Plan: Diagnosis: Debilitation/scrotal cellulitis/multi-medical Labs and images independently reviewed.  Records reviewed and summated above.  1. Does the need for close, 24 hr/day medical supervision in concert with the patient's rehab needs make it unreasonable for this patient to be  served in a less intensive setting? Yes  2. Co-Morbidities requiring supervision/potential complications: Acute on chronic anemia (transfuse if necessary to ensure appropriate perfusion for increased activity tolerance), type 2 diabetes mellitus (Monitor in accordance with exercise and adjust meds as necessary), bariatric surgery, right BKA in 2014 (cont prosthesis), HTN (monitor and provide prns in accordance with increased physical exertion and pain), venous stasis ulcers left lower extremity (cont to monitor, encourage elevation), chronic pain (Biofeedback training with therapies to help reduce reliance on opiate pain medications, monitor pain control during therapies, and sedation at rest and titrate to maximum efficacy to ensure participation and gains in therapies) 3. Due to bladder management, safety, skin/wound care, disease management, pain management and patient education, does the patient require 24 hr/day rehab nursing? Yes 4. Does the patient require coordinated care of a physician, rehab nurse, PT (1-2 hrs/day, 5 days/week) and OT (1-2 hrs/day, 5 days/week) to address physical and functional deficits in the context of the above medical diagnosis(es)? Yes Addressing deficits in the following areas: balance, endurance, locomotion, strength, transferring, bathing, dressing, toileting and psychosocial support 5. Can the patient actively participate in an intensive therapy program of at least 3 hrs of therapy per day at least 5 days per week? Potentially 6. The potential for patient to make measurable gains while on inpatient rehab is excellent 7. Anticipated functional outcomes upon discharge from inpatient rehab are supervision and min assist  with PT, supervision and min assist with OT, n/a with SLP. 8. Estimated rehab length of stay to reach the above functional goals is: 14-18 days. 9. Does the patient have adequate social supports and living environment to accommodate these discharge  functional goals? No 10. Anticipated D/C setting: Other 11. Anticipated post D/C treatments: HH therapy and Home excercise program 12. Overall Rehab/Functional Prognosis: good  RECOMMENDATIONS: This patient's condition is appropriate for continued rehabilitative care in the following setting: Based on patient's current functional deficits and comorbidities, he will unlikely be able to obtain an independent level of functioning after a short IRF and does not appear to be able to tolerate CIR at presents. Therefore, would recommend SNF. Will also await results plan for right hand.  CT finger right hand  Patient has agreed to participate in recommended program. Yes Note that insurance prior authorization may be required for reimbursement for recommended care.  Comment: Rehab Admissions Coordinator to follow up.  Maryla Morrow, MD 04/30/2015

## 2015-04-30 NOTE — Progress Notes (Signed)
Occupational Therapy Treatment Patient Details Name: Allison HickKevin Amero MRN: [id number] DOB: 1960-11-19 Today's Date: 04/30/2015    History of present illness 55 y.o. female with history of depression, type 2 diabetes which resolved with bariatric surgery, history of bariatric surgery, hypertension, venous stasis ulcers to the left lower extremity, vitamin A and D deficiency, protein calorie malnuitrition who presents with a 1-2 week history of worsening weakness to the point whereby patient is unable to get out of his wheelchair, skin breakdown in this sacral/buttocks region, scrotal swelling with some erythema.   OT comments  RN found OT in hallway and asked for assistance with getting patient from w/c back to bed. Pt required max assist for sit to/from stand from w/c using stedy lift.  If nursing staff unable to transfer patient using stedy lift, recommend maxi move for transfer in/out of bed to/from w/c.    Follow Up Recommendations  CIR;Supervision/Assistance - 24 hour    Equipment Recommendations  Other (comment) (TBD next venue of care)    Recommendations for Other Services  None at this time   Precautions / Restrictions Precautions Precautions: Fall Precaution Comments: Hx of Rt knee BKA Restrictions Weight Bearing Restrictions: No    Mobility Bed Mobility Overal bed mobility: Needs Assistance;+2 for physical assistance Bed Mobility: Sit to Supine Rolling: Min assist;+2 for physical assistance   Supine to sit: Mod assist;+2 for physical assistance;HOB elevated Sit to supine: Mod assist;+2 for safety/equipment Sit to sidelying: Min assist General bed mobility comments: Assistance wth truncal support and BLEs for back to bed.   Transfers Overall transfer level: Needs assistance Equipment used: None Transfers: Sit to/from Stand     Squat pivot transfers: +2 physical assistance;Max assist     General transfer comment: sit to stand from w/c, max assist due to generalized  weakness and pain. Bed pad and gait belt needed to aide in boosting pt into standing position.     Balance Overall balance assessment: Needs assistance Sitting-balance support: No upper extremity supported;Single extremity supported Sitting balance-Leahy Scale: Good         Standing balance comment: fair balance in stedy lift equipment   ADL Overall ADL's : Needs assistance/impaired General ADL Comments: Pt foune seated in w/c where PT/OT left pt after earlier session/OT eval. RN asked OT to assist with getting patient back to bed (of note, this OT discussed technique for assisting pt back to bed). Therapist assisted with transfer, using stedy. Pt required max assist +2 to stand. Recommend nursing staff using maximove if unable to transfer him back safely using stedy.       Vision Additional Comments: no change from baseline          Cognition   Behavior During Therapy: WFL for tasks assessed/performed Overall Cognitive Status: Within Functional Limits for tasks assessed   Extremity/Trunk Assessment  Upper Extremity Assessment Upper Extremity Assessment: Overall WFL for tasks assessed   Lower Extremity Assessment Lower Extremity Assessment: Defer to PT evaluation   Cervical / Trunk Assessment Cervical / Trunk Assessment: Kyphotic (forward head posture)               Pertinent Vitals/ Pain       Pain Assessment: Faces Pain Score: 8  Faces Pain Scale: Hurts whole lot Pain Location: groin and B hips Pain Descriptors / Indicators: Grimacing;Guarding;Aching;Sore Pain Intervention(s): Limited activity within patient's tolerance;Monitored during session;Repositioned  Home Living Family/patient expects to be discharged to:: Private residence Living Arrangements: Alone Available Help at Discharge: Personal care attendant (  3X per week for up to 6 hours per day) Type of Home: Apartment Home Access: Level entry     Home Layout: One level     Bathroom Shower/Tub:  Tub/shower unit;Curtain   Firefighter: Standard     Home Equipment: Wheelchair - power;Cane - single point;Walker - 2 wheels;Walker - 4 wheels;Bedside commode;Tub bench     Prior Functioning/Environment Level of Independence: Independent with assistive device(s)  Comments: Primarily power w/c, walking short distances and driving.   Frequency Min 2X/week     Progress Toward Goals  OT Goals(current goals can now be found in the care plan section)  Progress towards OT goals: Progressing toward goals  Acute Rehab OT Goals Patient Stated Goal: Regain my independence. Go to rehab OT Goal Formulation: With patient Time For Goal Achievement: 05/14/15 Potential to Achieve Goals: Good ADL Goals Pt Will Perform Grooming: with set-up;sitting (unsupported) Pt Will Perform Lower Body Bathing: with min assist;sit to/from stand Pt Will Perform Lower Body Dressing: with min assist;sit to/from stand Pt Will Transfer to Toilet: with mod assist;bedside commode;stand pivot transfer Additional ADL Goal #1: Pt will be supervision with bed mobility as a precursor for ADL  Plan Discharge plan remains appropriate    Co-evaluation    PT/OT/SLP Co-Evaluation/Treatment: Yes Reason for Co-Treatment: For patient/therapist safety;Complexity of the patient's impairments (multi-system involvement)   OT goals addressed during session: ADL's and self-care;Strengthening/ROM      End of Session Equipment Utilized During Treatment: Gait belt;Other (comment) (stedy lift)   Activity Tolerance Patient tolerated treatment well   Patient Left with call bell/phone within reach;in bed;with nursing/sitter in room   Nurse Communication Mobility status;Need for lift equipment     Time: 1610-9604 OT Time Calculation (min): 16 min  Charges: OT General Charges $OT Visit: 1 Procedure OT Evaluation $OT Eval Moderate Complexity: 1 Procedure OT Treatments $Self Care/Home Management : 8-22 mins  Edwin Cap , MS, OTR/L, CLT Pager: [phone]  04/30/2015, 1:29 PM

## 2015-04-30 NOTE — Consult Note (Addendum)
WOC wound consult note Reason for Consult: Consult requested for left leg.  Right stump is in a shrinker and pt states there is no need to assess this location, "there are no wounds and it is fine." Left leg with 2 full thickness stasis ulcers; .3X.3X.2cm and .8X.8X.2cm.  Both have a yellow wound bed and large amt yellow drainage, no odor.  Surrounding skin to left leg with scar tissue.  Pt has photos on their phone which display the wound previously involved the entire calf and has greatly improved during the past year.  They have been followed by the outpatient wound care center in the past and state that they do not use any other topical treatment at this time since wound has continued to improve with the current plan of care. Dressing procedure/placement/frequency: Pt has been using dry ABD pads and kerlex and changing Q day.  Continue present plan of care, discussed with patient and they deny further questions. Please re-consult if further assistance is needed.  Thank-you,  Cammie Mcgeeawn Tahmid Stonehocker MSN, RN, CWOCN, [place], CNS [phone]

## 2015-04-30 NOTE — Progress Notes (Signed)
Inpatient Rehabilitation  I met with the patient at the bedside to discuss his rehab disposition.  Pt. confirms that he has a [facility] caregiver 3 days per week,  6 hours each of those days.  In follow up discussion with rehab physician, he believes pt. will need 24/7 supervision/assist and will be best suited for SNF.  I have updated Carles Collet, RNCM by voicemail and discussed by phone with Domenica Reamer, CSW. I will sign off.  Please call if questions.  [facility] Admissions Coordinator Cell [phone] Office [phone] \

## 2015-04-30 NOTE — Progress Notes (Signed)
Patient refuses to go to CT. States he's been "pulled around enough the past two days and doesn't feel up to it again." Informed Dr. Lendell CapriceSullivan.

## 2015-04-30 NOTE — Evaluation (Signed)
Occupational Therapy Evaluation Patient Details Name: [name] MRN: [id number] DOB: 1960-04-04 Today's Date: 04/30/2015    History of Present Illness 55 y.o. female with history of depression, type 2 diabetes which resolved with bariatric surgery, history of bariatric surgery, hypertension, venous stasis ulcers to the left lower extremity, vitamin A and D deficiency, protein calorie malnuitrition who presents with a 1-2 week history of worsening weakness to the point whereby patient is unable to get out of his wheelchair, skin breakdown in this sacral/buttocks region, scrotal swelling with some erythema.   Clinical Impression   Patient presenting with decreased ADL and functional mobility independence secondary to above. Patient mod I PTA, requiring assistance with IADLs from PCA that came to residence ~3x per week. Patient currently functioning at an overall mod to max assist level (+2 needed for sit to/from stands and functional transfers using stedy lift). Patient will benefit from acute OT to increase overall independence in the areas of ADLs, functional mobility, and overall safety in order to safely discharge to venue listed below.     Follow Up Recommendations  CIR;Supervision/Assistance - 24 hour    Equipment Recommendations  Other (comment) (TBD next venue of care)    Recommendations for Other Services  None at this time   Precautions / Restrictions Precautions Precautions: Fall Precaution Comments: Hx of Rt knee BKA Restrictions Weight Bearing Restrictions: No    Mobility Bed Mobility Overal bed mobility: Needs Assistance;+2 for physical assistance Bed Mobility: Supine to Sit;Rolling Rolling: Min assist;+2 for physical assistance   Supine to sit: Mod assist;+2 for physical assistance;HOB elevated     General bed mobility comments: Mod assist +2 for truncal support and LLE support. Supervision needed for scooting forward on bed.   Transfers Overall transfer level:  Needs assistance   Transfers: Sit to/from Stand     Squat pivot transfers: +2 physical assistance;From elevated surface;Mod assist     General transfer comment: Attempted sit to stand without prosthesis, this was unsuccesful. Pt able to perform sit to stand with prosthesis with multimodal cueing and mod assist +2; used be pad to help boost into standing.     Balance Overall balance assessment: Needs assistance;History of Falls Sitting-balance support: No upper extremity supported;Single extremity supported Sitting balance-Leahy Scale: Good Standing balance comment: fair balance in stedy lift/machine    ADL Overall ADL's : Needs assistance/impaired Eating/Feeding: Set up;Bed level   Grooming: Set up;Bed level   Upper Body Bathing: Minimal assitance;Sitting   Lower Body Bathing: Bed level;Moderate assistance   Upper Body Dressing : Minimal assistance;Sitting   Lower Body Dressing: Moderate assistance;Bed level Lower Body Dressing Details (indicate cue type and reason): sittin EOB to don right prosthesis    Toilet Transfer Details (indicate cue type and reason): did not occur Toileting- Clothing Manipulation and Hygiene: Total assistance;Bed level     Tub/Shower Transfer Details (indicate cue type and reason): did nto occur   General ADL Comments: Pt found supine in bed and having to use BR. Assisted pt with rolling and placed bed pan for bowel movement. After bed moblity and toielting needs, pt engaged in bed mobility to sit EOB. Pt attempted to stand using stedy without prosthesis donned, no luck. Pt donned prosthesis with min assist, and was able to successfully stand using stedy and mod assist +2. Therapists assisted transfer pt to his w/c while he was in stedy lift.     Vision Additional Comments: no change from baseline  Pertinent Vitals/Pain Pain Assessment: 0-10 Pain Score: 8  Pain Location: groin and bilateral hips  Pain Descriptors / Indicators:  Aching;Guarding;Grimacing Pain Intervention(s): Limited activity within patient's tolerance;Monitored during session;Repositioned     Hand Dominance Right   Extremity/Trunk Assessment Upper Extremity Assessment Upper Extremity Assessment: Overall WFL for tasks assessed   Lower Extremity Assessment Lower Extremity Assessment: Defer to PT evaluation   Cervical / Trunk Assessment Cervical / Trunk Assessment: Kyphotic (forward head posture)   Communication Communication Communication: No difficulties   Cognition Arousal/Alertness: Awake/alert Behavior During Therapy: WFL for tasks assessed/performed Overall Cognitive Status: Within Functional Limits for tasks assessed              Home Living Family/patient expects to be discharged to:: Private residence Living Arrangements: Alone Available Help at Discharge: Personal care attendant (3X per week for up to 6 hours per day) Type of Home: Apartment Home Access: Level entry     Home Layout: One level     Bathroom Shower/Tub: Tub/shower unit;Curtain   Firefighter: Standard     Home Equipment: Wheelchair - power;Cane - single point;Walker - 2 wheels;Walker - 4 wheels;Bedside commode;Tub bench    Prior Functioning/Environment Level of Independence: Independent with assistive device(s)  Comments: Primarily power w/c, walking short distances and driving.    OT Diagnosis: Generalized weakness;Acute pain   OT Problem List: Decreased strength;Decreased activity tolerance;Impaired balance (sitting and/or standing);Decreased safety awareness;Decreased knowledge of use of DME or AE;Decreased knowledge of precautions;Pain;Decreased range of motion;Obesity   OT Treatment/Interventions: Self-care/ADL training;Therapeutic exercise;Energy conservation;DME and/or AE instruction;Therapeutic activities;Patient/family education;Balance training    OT Goals(Current goals can be found in the care plan section) Acute Rehab OT  Goals Patient Stated Goal: Regain my independence. Go to rehab OT Goal Formulation: With patient Time For Goal Achievement: 05/14/15 Potential to Achieve Goals: Good ADL Goals Pt Will Perform Grooming: with set-up;sitting (unsupported) Pt Will Perform Lower Body Bathing: with min assist;sit to/from stand Pt Will Perform Lower Body Dressing: with min assist;sit to/from stand Pt Will Transfer to Toilet: with mod assist;bedside commode;stand pivot transfer Additional ADL Goal #1: Pt will be supervision with bed mobility as a precursor for ADL  OT Frequency: Min 2X/week   Barriers to D/C: Decreased caregiver support       Co-evaluation PT/OT/SLP Co-Evaluation/Treatment: Yes Reason for Co-Treatment: For patient/therapist safety;Complexity of the patient's impairments (multi-system involvement)   OT goals addressed during session: ADL's and self-care;Strengthening/ROM      End of Session Equipment Utilized During Treatment: Gait belt;Other (comment) (stedy lift) Nurse Communication: Mobility status;Need for lift equipment  Activity Tolerance: Patient tolerated treatment well Patient left: in chair;with call bell/phone within reach   Time: 5784-6962 OT Time Calculation (min): 44 min Charges:  OT General Charges $OT Visit: 1 Procedure OT Evaluation $OT Eval Moderate Complexity: 1 Procedure OT Treatments $Self Care/Home Management : 8-22 mins  Edwin Cap , MS, OTR/L, CLT Pager: [phone]  04/30/2015, 11:36 AM

## 2015-05-01 LAB — BASIC METABOLIC PANEL
Anion gap: 7 (ref 5–15)
BUN: 10 mg/dL (ref 6–20)
BUN: 10 mg/dL (ref 4–21)
CALCIUM: 7.4 mg/dL — AB (ref 8.9–10.3)
CO2: 25 mmol/L (ref 22–32)
CREATININE: 0.8 mg/dL (ref 0.6–1.3)
CREATININE: 0.79 mg/dL (ref 0.61–1.24)
Chloride: 107 mmol/L (ref 101–111)
GFR calc non Af Amer: >60 mL/min (ref >60)
GLUCOSE: 100 mg/dL
GLUCOSE: 100 mg/dL — AB (ref 65–99)
Potassium: 4.4 mmol/L (ref 3.5–5.1)
SODIUM: 139 mmol/L (ref 137–147)
Sodium: 139 mmol/L (ref 135–145)

## 2015-05-01 LAB — CBC
HCT: 28.1 % — ABNORMAL LOW (ref 39.0–52.0)
Hemoglobin: 8.7 g/dL — ABNORMAL LOW (ref 13.0–17.0)
MCH: 29.5 pg (ref 26.0–34.0)
MCHC: 31 g/dL (ref 30.0–36.0)
MCV: 95.3 fL (ref 78.0–100.0)
PLATELETS: 283 10*3/uL (ref 150–400)
RBC: 2.95 MIL/uL — AB (ref 4.22–5.81)
RDW: 16.3 % — ABNORMAL HIGH (ref 11.5–15.5)
WBC: 5.8 10*3/uL (ref 4.0–10.5)

## 2015-05-01 LAB — CBC AND DIFFERENTIAL: WBC: 5.8 10^3/mL

## 2015-05-01 MED ORDER — ACETAMINOPHEN 325 MG PO TABS: 650.0000 mg | ORAL_TABLET | Freq: Four times a day (QID) | ORAL | Status: AC | PRN

## 2015-05-01 MED ORDER — MORPHINE SULFATE ER 15 MG PO TBCR: 15.0000 mg | EXTENDED_RELEASE_TABLET | Freq: Two times a day (BID) | ORAL | Status: DC

## 2015-05-01 MED ORDER — TORSEMIDE 20 MG PO TABS: 60.0000 mg | ORAL_TABLET | Freq: Every day | ORAL | Status: DC

## 2015-05-01 MED ORDER — OXYCODONE HCL 10 MG PO TABS: 10.0000 mg | ORAL_TABLET | Freq: Three times a day (TID) | ORAL | Status: DC | PRN

## 2015-05-01 NOTE — Progress Notes (Signed)
Patient will DC to: [facility] Anticipated DC date: 05/01/15 Family notified: Patient alert and oriented Transport by: Sharin MonsPTAR (May be backed up and will get to hospital around 8pm for transport)  CSW signing off.  Cristobal GoldmannNadia Tavarion [place], [place]CSWA Clinical Social Worker [phone]

## 2015-05-01 NOTE — Clinical Social Work Placement (Signed)
   CLINICAL SOCIAL WORK PLACEMENT  NOTE  Date:  05/01/2015  Patient Details  Name: [name] MRN: [id number] Date of Birth: [birth year]  Clinical Social Work is seeking post-discharge placement for this patient at the Skilled  Nursing Facility level of care (*CSW will initial, date and re-position this form in  chart as items are completed):  Yes   Patient/family provided with [place] Clinical Social Work Department's list of facilities offering this level of care within the geographic area requested by the patient (or if unable, by the patient's family).  Yes   Patient/family informed of their freedom to choose among providers that offer the needed level of care, that participate in Medicare, Medicaid or managed care program needed by the patient, have an available bed and are willing to accept the patient.  Yes   Patient/family informed of [facility]'s ownership interest in [facility] and [facility], as well as of the fact that they are under no obligation to receive care at these facilities.  PASRR submitted to EDS on       PASRR number received on       Existing PASRR number confirmed on 04/30/15     FL2 transmitted to all facilities in geographic area requested by pt/family on 04/30/15     FL2 transmitted to all facilities within larger geographic area on       Patient informed that his/her managed care company has contracts with or will negotiate with certain facilities, including the following:        Yes   Patient/family informed of bed offers received.  [facility]     Physician recommends and patient chooses bed at      [facility] 05[facility]01/[facility].  Patient to be transferred to facility by PTAR     Patient family notified on 05/01/15 of transfer.  Name of family member notified:  Patient will alert family     PHYSICIAN       Additional Comment:     _______________________________________________ Mearl LatinNadia S Larae Caison, LCSWA 05/01/2015, 4:38 PM

## 2015-05-01 NOTE — Discharge Summary (Signed)
Physician Discharge Summary  [name] ZOX:[id number] DOB: [birth year] DOA: 04/27/2015  PCP: [facility]  Admit date: 04/27/2015 Discharge date: 05/01/2015  Time spent: greater than 30 minutes  Recommendations for Outpatient Follow-up:  1. To skilled nursing facility 2. Monitor daily weights 3. Monitor basic metabolic panel periodically 4. Follow-up with Dr. Betha Loa next week 5. Continue splint to right ring finger 6. 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 female  With history of depression, type 2 diabetes which resolved with bariatric surgery, history of bariatric surgery, hypertension, venous stasis ulcers to the left lower extremity, vitamin A and D deficiency, protein calorie malnuitrition who presents to the ED with a 1-2 week history of worsening weakness to the point whereby patient is unable to get out of his wheelchair, skin breakdown in this sacral/buttocks region, scrotal swelling with some erythema. Patient is status post right BKA and has a caregiver at home. Patient stated that one week prior to admission he fell in the parking lot and since then weakness has gotten worse to the point where he is  unable to stand with worsening scrotal swelling. Patient denies any fevers, no chills, no nausea, no vomiting, no chest pain, no shortness of breath, no palpitations, no abdominal pain, no diarrhea, no constipation, no dysuria, no melena, no hematemesis, no hematochezia, no cough, no other associated symptoms. Patient states thyroid medication dose was recently adjusted however has not been able to pick up his medications from the pharmacy yet. CT abdomen and pelvis were done in the ED which showed diffuse soft tissue inflammation and edema about the abdominal and pelvic wall raising concern for soft tissue injury given her recent fall. This extends about the soft tissues superior to the scrotum cellulitis could have an appearance. Fatty infiltration of the liver. Basic metabolic profile done at a potassium of 3.4 otherwise was within normal limits. Lactic acid was 0.83. CBC had a hemoglobin of 10.7 otherwise was within normal limits.  Hospital Course:  Scrotal Cellulitis: Resolved. Clinically improved with Ancef and subsequently changed to cephalexin. Has no erythema and have stopped antibiotics.   Hypothyroidism: Synthroid dose increased recently. Would recheck TSH in a few months.   Anemia, iron deficiency: Anemia panel significant for an iron of 23. Ferritin is 429. Folate and B-12 levels okay. No evidence of bleeding.   Chronic pain: Outpatient regimen continued.   Venous stasis ulcer of left lower extremity (HCC): Has been chronic. Fairly extensive. Patient reports that it's actually improved from previous. He had been following with the wound clinic. Wound care consulted here and recommended continuing daily dressing changes which consist of ABD  pad over wounds and Kerlix dressing.  Deconditioning: Has worked with physical therapy who recommended inpatient rehabilitation. Patient was seen by rehabilitation medicine and was felt to be a better candidate for skilled nursing facility. Patient  is agreeable and stable for transfer was arranged.   Hypokalemia corrected   Pressure ulcer of buttock, unstageable (HCC): Per RN, stage 2, no infection  Finger fracture: Patient had a fall 2 weeks prior to admission. He reported that he had been diagnosed with finger fracture as an outpatient and was scheduled to follow-up with a hand surgeon. X-ray completed here showed avulsion fraction of the right ring finger middle and distal phalanges. Discussed the case with Dr. Betha Loa who consult is recommended CAT scan of the finger. This was done. See results. Dr. Merlyn Lot recommends outpatient follow-up and splinting. Further management per him. May follow-up within a week.  Procedures: None  Consultations:  Hand surgery, Kuzma  Physical medicine and rehabilitation  Discharge Exam: Filed Vitals:   05/01/15 0544 05/01/15 1343  BP: 97/54 101/57  Pulse: 74 79  Temp: 98.7 F (37.1 C) 98.2 F (36.8 C)  Resp: 16 17    General: Comfortable. Alert and oriented. Talkative. Cardiovascular: Regular rate rhythm without murmurs gallops rubs Respiratory: Clear to auscultation bilaterally without wheezes rhonchi or rales Abdomen soft nontender nondistended GU: No scrotal erythema. Edema slightly improved. Extremities: Right stump without lesion. Left lower extremity dressing with dried serous material. Right ring finger in splint.  Discharge Instructions   Discharge Instructions    Walk with assistance    Complete by:  As directed           Current Discharge Medication List    START taking these medications   Details  acetaminophen (TYLENOL) 325 MG tablet Take 2 tablets (650 mg total) by mouth every 6 (six) hours as needed for mild pain (or Fever >/= 101).      CONTINUE these medications which have CHANGED   Details  morphine (MS CONTIN) 15 MG 12 hr tablet Take 1 tablet (15 mg total) by mouth 2 (two) times daily. Qty: 30 tablet, Refills: 0    Oxycodone HCl 10 MG TABS Take 1  tablet (10 mg total) by mouth every 8 (eight) hours as needed (for breakthrough pain). Qty: 15 tablet, Refills: 0    torsemide (DEMADEX) 20 MG tablet Take 3 tablets (60 mg total) by mouth daily. Currently takes 1.5 tabs daily      CONTINUE these medications which have NOT CHANGED   Details  citalopram (CELEXA) 10 MG tablet Take 10 mg by mouth daily.     docusate sodium (COLACE) 100 MG capsule Take 100 mg by mouth daily.    DULoxetine (CYMBALTA) 20 MG capsule Take 20 mg by mouth daily.     ferrous sulfate 325 (65 FE) MG tablet Take 325 mg by mouth 2 (two) times daily.     folic acid (FOLVITE) 1 MG tablet Take 1 mg by mouth daily.     !! levothyroxine (SYNTHROID, LEVOTHROID) 200 MCG tablet TAKE ONE TABLET BY MOUTH DAILY Qty: 90 tablet, Refills: 1    Melatonin 5 MG TABS Take 5 mg by mouth at bedtime.    mirtazapine (REMERON) 7.5 MG tablet Take 7.5 mg by mouth at bedtime.     pantoprazole (PROTONIX) 40 MG tablet Take 40 mg by mouth daily.     Vitamin D, Ergocalciferol, (DRISDOL) 50000 UNITS CAPS capsule Take 1 capsule (50,000 Units total) by mouth daily. Qty: 30 capsule,  Refills: 0    !! levothyroxine (SYNTHROID, LEVOTHROID) 25 MCG tablet Take 25 mcg by mouth daily before breakfast.     !! - Potential duplicate medications found. Please discuss with provider.     No Known Allergies Follow-up Information    Follow up with Tami RibasKUZMA,Gaylynn R, MD.   Specialty:  Orthopedic Surgery   Why:  next week   Contact information:   [address] [place] [address] [phone]        The results of significant diagnostics from this hospitalization (including imaging, microbiology, ancillary and laboratory) are listed below for reference.    Significant Diagnostic Studies: Kore[place] Scrotum  04/28/2015  CLINICAL DATA:  Subacute onset of scrotal swelling, status post traumatic injury. Initial encounter. EXAM: SCROTAL ULTRASOUND DOPPLER ULTRASOUND OF THE TESTICLES TECHNIQUE: Complete ultrasound  examination of the testicles, epididymis, and other scrotal structures was performed. Color and spectral Doppler ultrasound were also utilized to evaluate blood flow to the testicles. COMPARISON:  CT of the abdomen and pelvis performed earlier today at 1:06 a.m. FINDINGS: Right testicle Measurements: 2.5 x 1.5 x 2.0 cm. No mass or microlithiasis visualized. Left testicle Measurements: 2.3 x 1.6 x 1.8 cm. No mass or microlithiasis visualized. Right epididymis:  Not visualized. Left epididymis:  Not visualized. Hydrocele:  None visualized. Varicocele:  None visualized. Pulsed Doppler interrogation of both testes demonstrates normal low resistance arterial and venous waveforms bilaterally. Vague soft tissue edema is noted at the scrotum. There is also extensive fat about the scrotal wall. IMPRESSION: 1. No evidence of testicular torsion. 2. Mild vague soft tissue edema noted at the scrotum. Electronically Signed   By: Roanna RaiderJeffery  Chang M.D.   On: 04/28/2015 07:14   Ct Abdomen Pelvis W Contrast  04/28/2015  CLINICAL DATA:  Status post fall while walking 1 week ago, with bilateral abdominal pain and scrotal swelling. Initial encounter. EXAM: CT ABDOMEN AND PELVIS WITH CONTRAST TECHNIQUE: Multidetector CT imaging of the abdomen and pelvis was performed using the standard protocol following bolus administration of intravenous contrast. CONTRAST:  80 mL of Omnipaque 300 IV contrast COMPARISON:  Right upper quadrant ultrasound performed 12/05/2013 FINDINGS: Mild bibasilar atelectasis is noted. Diffuse fatty infiltration is noted within the liver. The spleen is unremarkable in appearance. The patient is status post cholecystectomy, with clips noted along the gallbladder fossa. 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 The pancreas and adrenal glands are unremarkable. The kidneys are unremarkable in appearance. There is no evidence of hydronephrosis. No renal or  ureteral stones are seen. Minimal nonspecific perinephric stranding is noted bilaterally. No free fluid is identified. The small bowel is unremarkable in appearance. The stomach is within normal limits. No acute vascular abnormalities are seen. Mild scattered calcification is noted along the abdominal aorta and its branches. Diffuse soft tissue inflammation and edema are noted about the abdominal and pelvic wall, raising concern for soft tissue injury. This extends about the soft tissues superior to the scrotum. Cellulitis could have such an appearance. The appendix is grossly unremarkable in appearance, without evidence of appendicitis. The colon is unremarkable in appearance. The bladder is mildly distended and grossly remarkable. The prostate remains normal in size. No inguinal lymphadenopathy is seen. No acute osseous abnormalities are identified. There is chronic compression deformity involving vertebral body T7. Diffuse osteopenia of visualized osseous structures is noted. IMPRESSION: 1. Diffuse soft tissue inflammation and edema about the abdominal and pelvic wall, raising concern for soft tissue injury given recent  fall. This extends about the soft tissues superior to the scrotum. Cellulitis could have such an appearance. 2. Mild scattered calcification along the abdominal aorta and its branches. 3. Diffuse fatty infiltration within the liver. 4. Mild bibasilar atelectasis noted. 5. Chronic compression deformity of vertebral body T7, with diffuse osteopenia of visualized osseous structures Electronically Signed   By: Roanna Raider M.D.   On: 04/28/2015 02:22   Ct Finger Right Wo Contrast  04/30/2015  CLINICAL DATA:  Volar plate avulsion injury of the right hand. EXAM: CT OF THE RIGHT FINGERS WITHOUT CONTRAST TECHNIQUE: Multidetector CT imaging was performed according to the standard protocol. Multiplanar CT image reconstructions were also generated. COMPARISON:  None. FINDINGS: There is a comminuted  fracture of the volar base of the fourth distal phalanx. There is a sliver of bone along the volar base of the fourth middle phalanx without a donor site suggesting an avulsed fragment from the fourth distal phalanx with 2.4 cm of retraction of the flexor digitorum profundus tendon. There is no other fracture or dislocation. There is no lytic or sclerotic osseous lesion. There is no fluid collection or hematoma. IMPRESSION: Comminuted fracture of the volar base of the fourth distal phalanx. Sliver of bone along the volar base of the fourth middle phalanx without a donor site suggesting an avulsed fragment from the fourth distal phalanx with 2.4 cm of retraction of the flexor digitorum profundus tendon. Electronically Signed   By: Elige Ko   On: 04/30/2015 14:48   [place] Art/ven Flow Abd Pelv Doppler  04/28/2015  CLINICAL DATA:  Subacute onset of scrotal swelling, status post traumatic injury. Initial encounter. EXAM: SCROTAL ULTRASOUND DOPPLER ULTRASOUND OF THE TESTICLES TECHNIQUE: Complete ultrasound examination of the testicles, epididymis, and other scrotal structures was performed. Color and spectral Doppler ultrasound were also utilized to evaluate blood flow to the testicles. COMPARISON:  CT of the abdomen and pelvis performed earlier today at 1:06 a.m. FINDINGS: Right testicle Measurements: 2.5 x 1.5 x 2.0 cm. No mass or microlithiasis visualized. Left testicle Measurements: 2.3 x 1.6 x 1.8 cm. No mass or microlithiasis visualized. Right epididymis:  Not visualized. Left epididymis:  Not visualized. Hydrocele:  None visualized. Varicocele:  None visualized. Pulsed Doppler interrogation of both testes demonstrates normal low resistance arterial and venous waveforms bilaterally. Vague soft tissue edema is noted at the scrotum. There is also extensive fat about the scrotal wall. IMPRESSION: 1. No evidence of testicular torsion. 2. Mild vague soft tissue edema noted at the scrotum. Electronically Signed   By:  Roanna Raider M.D.   On: 04/28/2015 07:14   Dg Hand Complete Right  04/28/2015  CLINICAL DATA:  Fall 1 week ago. Right hand pain and decreased range of motion. Initial encounter. EXAM: RIGHT HAND - COMPLETE 3+ VIEW COMPARISON:  None. FINDINGS: Mildly displaced fractures are seen involving the proximal volar margins of the proximal and middle phalanges of the ring finger. No evidence of dislocation. No other fractures identified. IMPRESSION: Volar plate avulsion fractures involving the proximal and middle phalanges of the ring finger. Electronically Signed   By: Myles Rosenthal M.D.   On: 04/28/2015 14:32   Dg Hip Unilat With Pelvis 2-3 Views Left  04/28/2015  CLINICAL DATA:  Pain EXAM: DG HIP (WITH OR WITHOUT PELVIS) 2-3V LEFT COMPARISON:  None FINDINGS: Retained contrast in a distended bladder. Diffuse osseous demineralization. Hip joint spaces preserved. Scattered soft tissue and stool artifacts. No definite fracture or bone destruction. IMPRESSION: Osseous demineralization without definite acute  bony abnormalities. Electronically Signed   By: Ulyses Southward M.D.   On: 04/28/2015 14:30    Microbiology: No results found for this or any previous visit (from the past 240 hour(s)).   Labs: Basic Metabolic Panel:  Recent Labs Lab 04/27/15 1702 04/28/15 0750 04/29/15 0620 05/01/15 0615  NA 139 139 140 139  K 3.4* 3.2* 3.9 4.4  CL 98* 103 106 107  CO2 GLUCOSE 118* 133* 102* 100*  BUN CREATININE 1.08 0.82 0.79 0.79  CALCIUM 8.1* 7.3* 7.3* 7.4*  MG  --  1.6*  --   --   PHOS  --  2.2*  --   --    Liver Function Tests:  Recent Labs Lab 04/28/15 0750  AST 20  ALT 15*  ALKPHOS 213*  BILITOT 0.7  PROT 5.6*  ALBUMIN 2.2*   No results for input(s): LIPASE, AMYLASE in the last 168 hours. No results for input(s): AMMONIA in the last 168 hours. CBC:  Recent Labs Lab 04/27/15 1702 04/28/15 0750 04/29/15 0620 05/01/15 0615  WBC 7.9 6.9  --  5.8  NEUTROABS  --   5.2  --   --   HGB 10.4* 8.5* 8.4* 8.7*  HCT 32.7* 28.1* 27.4* 28.1*  MCV 94.0 93.0  --  95.3  PLT 361 308  --  283   Cardiac Enzymes:  Recent Labs Lab 04/28/15 1426  CKTOTAL 87   BNP: BNP (last 3 results) No results for input(s): BNP in the last 8760 hours.  ProBNP (last 3 results) No results for input(s): PROBNP in the last 8760 hours.  CBG:  Recent Labs Lab 04/27/15 1656  GLUCAP 101*       Signed:  Christiane Ha MD [facility] 05/01/2015, 2:28 PM

## 2015-05-01 NOTE — Care Management Note (Signed)
Case Management Note  Patient Details  Name: [name] MRN: [id number] Date of Birth: [birth year]  Subjective/Objective:                    Action/Plan:  DC to SNF as facilitated through CSW.  Expected Discharge Date:                  Expected Discharge Plan:  Skilled Nursing Facility  In-House Referral:  Clinical Social Work  Discharge planning Services     Post Acute Care Choice:    Choice offered to:     DME Arranged:    DME Agency:     HH Arranged:    HH Agency:     Status of Service:  Completed, signed off  Medicare Important Message Given:    Date Medicare IM Given:    Medicare IM give by:    Date Additional Medicare IM Given:    Additional Medicare Important Message give by:     If discussed at Long Length of Stay Meetings, dates discussed:    Additional Comments:  Allison SabalDebbie [name], RN 05/01/2015, 3:06 PM

## 2015-05-03 ENCOUNTER — Encounter: Payer: Self-pay | Admitting: Internal Medicine

## 2015-05-03 ENCOUNTER — Non-Acute Institutional Stay (SKILLED_NURSING_FACILITY): Payer: 59 | Admitting: Internal Medicine

## 2015-05-03 DIAGNOSIS — L8992 Pressure ulcer of unspecified site, stage 2: Secondary | ICD-10-CM | POA: Diagnosis not present

## 2015-05-03 DIAGNOSIS — I10 Essential (primary) hypertension: Secondary | ICD-10-CM

## 2015-05-03 DIAGNOSIS — R5381 Other malaise: Secondary | ICD-10-CM | POA: Diagnosis not present

## 2015-05-03 DIAGNOSIS — K219 Gastro-esophageal reflux disease without esophagitis: Secondary | ICD-10-CM | POA: Diagnosis not present

## 2015-05-03 DIAGNOSIS — I83029 Varicose veins of left lower extremity with ulcer of unspecified site: Secondary | ICD-10-CM | POA: Diagnosis not present

## 2015-05-03 DIAGNOSIS — K5900 Constipation, unspecified: Secondary | ICD-10-CM

## 2015-05-03 DIAGNOSIS — F329 Major depressive disorder, single episode, unspecified: Secondary | ICD-10-CM | POA: Diagnosis not present

## 2015-05-03 DIAGNOSIS — E46 Unspecified protein-calorie malnutrition: Secondary | ICD-10-CM

## 2015-05-03 DIAGNOSIS — G894 Chronic pain syndrome: Secondary | ICD-10-CM | POA: Diagnosis not present

## 2015-05-03 DIAGNOSIS — D509 Iron deficiency anemia, unspecified: Secondary | ICD-10-CM | POA: Diagnosis not present

## 2015-05-03 DIAGNOSIS — E039 Hypothyroidism, unspecified: Secondary | ICD-10-CM

## 2015-05-03 DIAGNOSIS — N492 Inflammatory disorders of scrotum: Secondary | ICD-10-CM

## 2015-05-03 DIAGNOSIS — IMO0001 Reserved for inherently not codable concepts without codable children: Secondary | ICD-10-CM

## 2015-05-03 DIAGNOSIS — F32A Depression, unspecified: Secondary | ICD-10-CM

## 2015-05-03 NOTE — Progress Notes (Signed)
[facility]  PCP: No primary care provider on file.   Code Status: Full Code   Goals of care: Advanced Directive information Advanced Directives 04/27/2015  Does patient have an advance directive? No  Would patient like information on creating an advanced directive? No - patient declined information       Extended Emergency Contact Information Primary Emergency Contact: Menge,Julie  [place] Mobile Phone: [phone] Relation: Sister   No Known Allergies  Chief Complaint  Patient presents with  . New Admit To SNF    New Admission     HPI:  Patient is a 55 y.o. female seen today for short term rehabilitation post hospital admission from 04/27/15-05/01/15 with scrotal cellulitis and was treated with antibiotics. He has PMH of HTN, DM, hypothyroidism, stasis ulcer, chronic pain syndrome among others.   Review of Systems:  Constitutional: Negative for fever, chills.  HENT: Negative for headache, congestion, nasal discharge, hearing loss, sore throat, difficulty swallowing. Eyes: Negative for blurred vision, double vision and discharge.  Respiratory: Negative for cough, shortness of breath and wheezing. Has some orthopnea. Cardiovascular: Negative for chest pain, palpitations, leg swelling.  Gastrointestinal: Negative for heartburn, nausea, vomiting, abdominal pain, loss of appetite, melena, diarrhea and constipation. Had bowel movement this morning  Genitourinary: Negative for dysuria and flank pain.  Musculoskeletal: Negative for back pain, fall.  Skin: Negative for itching, rash.  Neurological: Negative for weakness and dizziness. Psychiatric/Behavioral: Negative for depression, anxiety, insomnia and memory loss.    Past Medical History  Diagnosis Date  . Chicken pox   . Depression   . DM type 2 (diabetes mellitus, type 2) (HCC) 2006    Essentially resolved following bariatric surgery  . Venous stasis   . HTN (hypertension)     History  of; resolved following bariatric surgery  . History of sleep apnea   . History of malnutrition   . History of vitamin A deficiency    Past Surgical History  Procedure Laterality Date  . Cholecystectomy  1991  . Appendectomy  1991  . Tonsillectomy and adenoidectomy  1967  . Severe malnutrition  2014  . Below knee leg amputation Right 2014  . Hernia repair  2009  . Gastric sleeve, biliopancreatic diversion, duodenal switch  2006   Social History:   reports that he has never smoked. He has never used smokeless tobacco. He reports that he does not drink alcohol or use illicit drugs.  Family History  Problem Relation Age of Onset  . Arthritis Mother   . Diabetes Mother     Medications:   Medication List       This list is accurate as of: 05/03/15 12:51 PM.  Always use your most recent med list.               acetaminophen 325 MG tablet  Commonly known as:  TYLENOL  Take 2 tablets (650 mg total) by mouth every 6 (six) hours as needed for mild pain (or Fever >/= 101).     citalopram 10 MG tablet  Commonly known as:  CELEXA  Take 10 mg by mouth daily.     docusate sodium 100 MG capsule  Commonly known as:  COLACE  Take 100 mg by mouth daily.     DULoxetine 20 MG capsule  Commonly known as:  CYMBALTA  Take 20 mg by mouth daily.     ergocalciferol 50000 units capsule  Commonly known as:  VITAMIN D2  Take 50,000  Units by mouth once a week.     ferrous sulfate 325 (65 FE) MG tablet  Take 325 mg by mouth 2 (two) times daily.     folic acid 1 MG tablet  Commonly known as:  FOLVITE  Take 1 mg by mouth daily.     levothyroxine 25 MCG tablet  Commonly known as:  SYNTHROID, LEVOTHROID  Take 25 mcg by mouth daily before breakfast.     levothyroxine 200 MCG tablet  Commonly known as:  SYNTHROID, LEVOTHROID  TAKE ONE TABLET BY MOUTH DAILY     Melatonin 5 MG Tabs  Take 5 mg by mouth at bedtime.     mirtazapine 7.5 MG tablet  Commonly known as:  REMERON  Take 7.5 mg  by mouth at bedtime.     morphine 15 MG 12 hr tablet  Commonly known as:  MS CONTIN  Take 15 mg by mouth every 12 (twelve) hours.     Oxycodone HCl 10 MG Tabs  Take 1 tablet (10 mg total) by mouth every 8 (eight) hours as needed (for breakthrough pain).     pantoprazole 40 MG tablet  Commonly known as:  PROTONIX  Take 40 mg by mouth daily.     torsemide 20 MG tablet  Commonly known as:  DEMADEX  Take 3 tablets (60 mg total) by mouth daily. Currently takes 1.5 tabs daily        Immunizations:  There is no immunization history on file for this patient.   Physical Exam: Filed Vitals:   05/03/15 1138  BP: 96/53  Pulse: 82  Temp: 97.9 F (36.6 C)  TempSrc: Oral  Resp: 18  Height: 6' (1.829 m)  Weight: 228 lb 12.8 oz (103.783 kg)  SpO2: 94%   Body mass index is 31.02 kg/(m^2).  General- adult female, obese, in no acute distress Head- normocephalic, atraumatic Nose- no maxillary or frontal sinus tenderness, no nasal discharge Throat- moist mucus membrane Eyes- PERRLA, EOMI, no pallor, no icterus Neck- no cervical lymphadenopathy Cardiovascular- normal s1,s2, no murmur Respiratory- bilateral clear to auscultation, no wheeze, no rhonchi, no crackles, no use of accessory muscles Abdomen- bowel sounds present, soft, non tender Musculoskeletal- right BKA, able to move all other extremities, generalized weakness Neurological- alert and oriented to person, place and time Skin- warm and dry, + scrotal swelling but improved from before on comparison with the picture he has, left great toe tip has arterial ulcer, left upper back abrasion, left leg venous ulcers, stage 2 pressure ulcer to left buttock and right buttock, 2 opening on abdominal wall with dressing, right ring finger with brace Psychiatry- normal mood and affect    Labs reviewed: Basic Metabolic Panel:  Recent Labs  16/10/96 0750 04/29/15 0620 05/01/15 05/01/15 0615  NA 139 140 139 139  K 3.2* 3.9  --  4.4    CL 103 106  --  107  CO2 27 27  --  25  GLUCOSE 133* 102*  --  100*  BUN CREATININE 0.82 0.79 0.8 0.79  CALCIUM 7.3* 7.3*  --  7.4*  MG 1.6*  --   --   --   PHOS 2.2*  --   --   --    Liver Function Tests:  Recent Labs  01/17/15 0914 04/28/15 0750  AST 21 20  ALT 22 15*  ALKPHOS 286* 213*  BILITOT 0.5 0.7  PROT 6.5 5.6*  ALBUMIN 3.2*  3.2* 2.2*   No results  for input(s): LIPASE, AMYLASE in the last 8760 hours. No results for input(s): AMMONIA in the last 8760 hours. CBC:  Recent Labs  04/27/15 1702 04/28/15 0750 04/29/15 0620 05/01/15 05/01/15 0615  WBC 7.9 6.9  --  5.8 5.8  NEUTROABS  --  5.2  --   --   --   HGB 10.4* 8.5* 8.4*  --  8.7*  HCT 32.7* 28.1* 27.4*  --  28.1*  MCV 94.0 93.0  --   --  95.3  PLT 361 308  --   --  283   Cardiac Enzymes:  Recent Labs  04/28/15 1426  CKTOTAL 87   BNP: Invalid input(s): POCBNP CBG:  Recent Labs  04/27/15 1656  GLUCAP 101*    Radiological Exams: US Scrotum  04/28/2015  CLINICAL DATA:  Subacute onset of scrotal swelling, status post traumatic injury. Initial encounter. EXAM: SCROTAL ULTRASOUND DOPPLER ULTRASOUND OF THE TESTICLES TECHNIQUE: Complete ultrasound examination of the testicles, epididymis, and other scrotal structures was performed. Color and spectral Doppler ultrasound were also utilized to evaluate blood flow to the testicles. COMPARISON:  CT of the abdomen and pelvis performed earlier today at 1:06 a.m. FINDINGS: Right testicle Measurements: 2.5 x 1.5 x 2.0 cm. No mass or microlithiasis visualized. Left testicle Measurements: 2.3 x 1.6 x 1.8 cm. No mass or microlithiasis visualized. Right epididymis:  Not visualized. Left epididymis:  Not visualized. Hydrocele:  None visualized. Varicocele:  None visualized. Pulsed Doppler interrogation of both testes demonstrates normal low resistance arterial and venous waveforms bilaterally. Vague soft tissue edema is noted at the scrotum. There is also  extensive fat about the scrotal wall. IMPRESSION: 1. No evidence of testicular torsion. 2. Mild vague soft tissue edema noted at the scrotum. Electronically Signed   By: Roanna Raider M.D.   On: 04/28/2015 07:14   Ct Abdomen Pelvis W Contrast  04/28/2015  CLINICAL DATA:  Status post fall while walking 1 week ago, with bilateral abdominal pain and scrotal swelling. Initial encounter. EXAM: CT ABDOMEN AND PELVIS WITH CONTRAST TECHNIQUE: Multidetector CT imaging of the abdomen and pelvis was performed using the standard protocol following bolus administration of intravenous contrast. CONTRAST:  80 mL of Omnipaque 300 IV contrast COMPARISON:  Right upper quadrant ultrasound performed 12/05/2013 FINDINGS: Mild bibasilar atelectasis is noted. Diffuse fatty infiltration is noted within the liver. The spleen is unremarkable in appearance. The patient is status post cholecystectomy, with clips noted along the gallbladder fossa. 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 The pancreas and adrenal glands are unremarkable. The kidneys are unremarkable in appearance. There is no evidence of hydronephrosis. No renal or ureteral stones are seen. Minimal nonspecific perinephric stranding is noted bilaterally. No free fluid is identified. The small bowel is unremarkable in appearance. The stomach is within normal limits. No acute vascular abnormalities are seen. Mild scattered calcification is noted along the abdominal aorta and its branches. Diffuse soft tissue inflammation and edema are noted about the abdominal and pelvic wall, raising concern for soft tissue injury. This extends about the soft tissues superior to the scrotum. Cellulitis could have such an appearance. The appendix is grossly unremarkable in appearance, without evidence of appendicitis. The colon is unremarkable in appearance. The bladder is mildly distended and grossly remarkable. The prostate remains  normal in size. No inguinal lymphadenopathy is seen. No acute osseous abnormalities are identified. There is chronic compression deformity involving vertebral body T7. Diffuse osteopenia of visualized osseous structures is noted.  IMPRESSION: 1. Diffuse soft tissue inflammation and edema about the abdominal and pelvic wall, raising concern for soft tissue injury given recent fall. This extends about the soft tissues superior to the scrotum. Cellulitis could have such an appearance. 2. Mild scattered calcification along the abdominal aorta and its branches. 3. Diffuse fatty infiltration within the liver. 4. Mild bibasilar atelectasis noted. 5. Chronic compression deformity of vertebral body T7, with diffuse osteopenia of visualized osseous structures Electronically Signed   By: Roanna Raider M.D.   On: 04/28/2015 02:22   Ct Finger Right Wo Contrast  04/30/2015  CLINICAL DATA:  Volar plate avulsion injury of the right hand. EXAM: CT OF THE RIGHT FINGERS WITHOUT CONTRAST TECHNIQUE: Multidetector CT imaging was performed according to the standard protocol. Multiplanar CT image reconstructions were also generated. COMPARISON:  None. FINDINGS: There is a comminuted fracture of the volar base of the fourth distal phalanx. There is a sliver of bone along the volar base of the fourth middle phalanx without a donor site suggesting an avulsed fragment from the fourth distal phalanx with 2.4 cm of retraction of the flexor digitorum profundus tendon. There is no other fracture or dislocation. There is no lytic or sclerotic osseous lesion. There is no fluid collection or hematoma. IMPRESSION: Comminuted fracture of the volar base of the fourth distal phalanx. Sliver of bone along the volar base of the fourth middle phalanx without a donor site suggesting an avulsed fragment from the fourth distal phalanx with 2.4 cm of retraction of the flexor digitorum profundus tendon. Electronically Signed   By: Elige Ko   On: 04/30/2015  14:48   [place] Art/ven Flow Abd Pelv Doppler  04/28/2015  CLINICAL DATA:  Subacute onset of scrotal swelling, status post traumatic injury. Initial encounter. EXAM: SCROTAL ULTRASOUND DOPPLER ULTRASOUND OF THE TESTICLES TECHNIQUE: Complete ultrasound examination of the testicles, epididymis, and other scrotal structures was performed. Color and spectral Doppler ultrasound were also utilized to evaluate blood flow to the testicles. COMPARISON:  CT of the abdomen and pelvis performed earlier today at 1:06 a.m. FINDINGS: Right testicle Measurements: 2.5 x 1.5 x 2.0 cm. No mass or microlithiasis visualized. Left testicle Measurements: 2.3 x 1.6 x 1.8 cm. No mass or microlithiasis visualized. Right epididymis:  Not visualized. Left epididymis:  Not visualized. Hydrocele:  None visualized. Varicocele:  None visualized. Pulsed Doppler interrogation of both testes demonstrates normal low resistance arterial and venous waveforms bilaterally. Vague soft tissue edema is noted at the scrotum. There is also extensive fat about the scrotal wall. IMPRESSION: 1. No evidence of testicular torsion. 2. Mild vague soft tissue edema noted at the scrotum. Electronically Signed   By: Roanna Raider M.D.   On: 04/28/2015 07:14   Dg Hand Complete Right  04/28/2015  CLINICAL DATA:  Fall 1 week ago. Right hand pain and decreased range of motion. Initial encounter. EXAM: RIGHT HAND - COMPLETE 3+ VIEW COMPARISON:  None. FINDINGS: Mildly displaced fractures are seen involving the proximal volar margins of the proximal and middle phalanges of the ring finger. No evidence of dislocation. No other fractures identified. IMPRESSION: Volar plate avulsion fractures involving the proximal and middle phalanges of the ring finger. Electronically Signed   By: Myles Rosenthal M.D.   On: 04/28/2015 14:32   Dg Hip Unilat With Pelvis 2-3 Views Left  04/28/2015  CLINICAL DATA:  Pain EXAM: DG HIP (WITH OR WITHOUT PELVIS) 2-3V LEFT COMPARISON:  None FINDINGS:  Retained contrast in a distended bladder. Diffuse osseous demineralization.  Hip joint spaces preserved. Scattered soft tissue and stool artifacts. No definite fracture or bone destruction. IMPRESSION: Osseous demineralization without definite acute bony abnormalities. Electronically Signed   By: Ulyses Southward M.D.   On: 04/28/2015 14:30    Assessment/Plan  Physical deconditioning Will have him work with physical therapy and occupational therapy team to help with gait training and muscle strengthening exercises.fall precautions. Skin care. Encourage to be out of bed.   Protein calorie malnutrition Continue protein supplement, to provide extra portion protein with meals and get dietary consult. Continue remeron  Scrotal cellulitis Improved. Monitor clinically and continue skin care. Completed his antibiotics.   Stage 2 pressure ulcer Continue air mattress, frequent positioning and continue wound care. 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 Hold torsemide for SBP < 100.   Depression Stable mood. Continue celexa 10 mg daily    Goals of care: short term rehabilitation   Labs/tests ordered: cbc, cmp 05/07/15  Family/ staff Communication: reviewed care plan with patient and nursing supervisor    Oneal Grout, MD Internal Medicine [facility] [address] [place], [place] [address] Cell Phone (Monday-Friday 8 am - 5 pm):  [phone] On Call: [phone] and follow prompts after 5 pm and on weekends Office Phone: [phone] Office Fax: [phone]

## 2015-05-07 LAB — BASIC METABOLIC PANEL
BUN: 8 mg/dL (ref 4–21)
BUN: 8 mg/dL (ref 4–21)
Creatinine: 0.6 mg/dL (ref 0.6–1.3)
Creatinine: 0.6 mg/dL (ref <=1.3)
GLUCOSE: 80 mg/dL
GLUCOSE: 80 mg/dL
POTASSIUM: 4.5 mmol/L (ref 3.4–5.3)
Potassium: 4.5 mmol/L (ref 3.4–5.3)
SODIUM: 138 mmol/L (ref 137–147)
SODIUM: 138 mmol/L (ref 137–147)

## 2015-05-07 LAB — CBC AND DIFFERENTIAL
HEMATOCRIT: 32 % — AB (ref 41–53)
HEMATOCRIT: 32 % — AB (ref 41–53)
HEMOGLOBIN: 9.4 g/dL — AB (ref 13.5–17.5)
HEMOGLOBIN: 9.4 g/dL — AB (ref 13.5–17.5)
PLATELETS: 312 10*3/uL (ref 150–399)
Platelets: 312 10*3/uL (ref 150–399)
WBC: 6 10^3/mL
WBC: 6 10^3/mL

## 2015-05-07 LAB — HEPATIC FUNCTION PANEL
ALT: 14 U/L (ref 10–40)
AST: 24 U/L (ref 14–40)
Alkaline Phosphatase: 372 U/L — AB (ref 25–125)
BILIRUBIN, TOTAL: 0.4 mg/dL

## 2015-05-08 ENCOUNTER — Ambulatory Visit: Payer: 59 | Admitting: Family Medicine

## 2015-05-09 ENCOUNTER — Encounter (HOSPITAL_BASED_OUTPATIENT_CLINIC_OR_DEPARTMENT_OTHER): Payer: Self-pay | Admitting: *Deleted

## 2015-05-09 ENCOUNTER — Other Ambulatory Visit: Payer: Self-pay | Admitting: Orthopedic Surgery

## 2015-05-10 ENCOUNTER — Ambulatory Visit (HOSPITAL_BASED_OUTPATIENT_CLINIC_OR_DEPARTMENT_OTHER): Payer: 59 | Admitting: Certified Registered"

## 2015-05-10 ENCOUNTER — Encounter (HOSPITAL_BASED_OUTPATIENT_CLINIC_OR_DEPARTMENT_OTHER)
Admission: RE | Disposition: A | Discharge status: Skilled Nursing Facility | Payer: Self-pay | Source: Ambulatory Visit | Attending: Orthopedic Surgery

## 2015-05-10 ENCOUNTER — Encounter (HOSPITAL_BASED_OUTPATIENT_CLINIC_OR_DEPARTMENT_OTHER): Payer: Self-pay | Admitting: Certified Registered"

## 2015-05-10 ENCOUNTER — Ambulatory Visit (HOSPITAL_BASED_OUTPATIENT_CLINIC_OR_DEPARTMENT_OTHER)
Admission: RE | Admit: 2015-05-10 | Discharge: 2015-05-10 | Disposition: A | Discharge status: Skilled Nursing Facility | Payer: 59 | Source: Ambulatory Visit | Attending: Orthopedic Surgery | Admitting: Orthopedic Surgery

## 2015-05-10 HISTORY — DX: Anemia, unspecified: D64.9

## 2015-05-10 HISTORY — DX: Sleep apnea, unspecified: G47.30

## 2015-05-10 HISTORY — DX: Other chronic pain: G89.29

## 2015-05-10 HISTORY — DX: Anxiety disorder, unspecified: F41.9

## 2015-05-10 SURGERY — CANCELLED PROCEDURE
Laterality: Right

## 2015-05-10 MED ORDER — CHLORHEXIDINE GLUCONATE 4 % EX LIQD: 60.0000 mL | Freq: Once | CUTANEOUS | Status: DC

## 2015-05-10 MED ORDER — FENTANYL CITRATE (PF) 100 MCG/2ML IJ SOLN: 50.0000 ug | INTRAMUSCULAR | Status: DC | PRN

## 2015-05-10 MED ORDER — GLYCOPYRROLATE 0.2 MG/ML IJ SOLN: 0.2000 mg | Freq: Once | INTRAMUSCULAR | Status: DC | PRN

## 2015-05-10 MED ORDER — SCOPOLAMINE 1 MG/3DAYS TD PT72: 1.0000 | MEDICATED_PATCH | Freq: Once | TRANSDERMAL | Status: DC | PRN

## 2015-05-10 MED ORDER — LACTATED RINGERS IV SOLN: INTRAVENOUS | Status: DC

## 2015-05-10 MED ORDER — CEFAZOLIN SODIUM-DEXTROSE 2-3 GM-% IV SOLR: 2.0000 g | INTRAVENOUS | Status: DC

## 2015-05-10 MED ORDER — MIDAZOLAM HCL 2 MG/2ML IJ SOLN: 1.0000 mg | INTRAMUSCULAR | Status: DC | PRN

## 2015-05-10 SURGICAL SUPPLY — 74 items
BAG DECANTER FOR FLEXI CONT (MISCELLANEOUS) IMPLANT
BANDAGE ACE 3X5.8 VEL STRL LF (GAUZE/BANDAGES/DRESSINGS) ×3 IMPLANT
BLADE MINI RND TIP GREEN BEAV (BLADE) IMPLANT
BLADE SURG 15 STRL LF DISP TIS (BLADE) ×4 IMPLANT
BLADE SURG 15 STRL SS (BLADE) ×2
BNDG CONFORM 2 STRL LF (GAUZE/BANDAGES/DRESSINGS) IMPLANT
BNDG ELASTIC 2X5.8 VLCR STR LF (GAUZE/BANDAGES/DRESSINGS) IMPLANT
BNDG ESMARK 4X9 LF (GAUZE/BANDAGES/DRESSINGS) ×3 IMPLANT
BNDG GAUZE ELAST 4 BULKY (GAUZE/BANDAGES/DRESSINGS) IMPLANT
CATH ROBINSON RED A/P 10FR (CATHETERS) IMPLANT
CHLORAPREP W/TINT 26ML (MISCELLANEOUS) ×3 IMPLANT
CORDS BIPOLAR (ELECTRODE) ×3 IMPLANT
COTTONBALL LRG STERILE PKG (GAUZE/BANDAGES/DRESSINGS) IMPLANT
COVER BACK TABLE 60X90IN (DRAPES) ×3 IMPLANT
COVER MAYO STAND STRL (DRAPES) ×3 IMPLANT
CUFF TOURNIQUET SINGLE 18IN (TOURNIQUET CUFF) ×3 IMPLANT
DECANTER SPIKE VIAL GLASS SM (MISCELLANEOUS) IMPLANT
DRAIN TLS ROUND 10FR (DRAIN) IMPLANT
DRAPE EXTREMITY T 121X128X90 (DRAPE) ×3 IMPLANT
DRAPE OEC MINIVIEW 54X84 (DRAPES) IMPLANT
DRAPE SURG 17X23 STRL (DRAPES) ×3 IMPLANT
DRSG PAD ABDOMINAL 8X10 ST (GAUZE/BANDAGES/DRESSINGS) IMPLANT
GAUZE SPONGE 4X4 12PLY STRL (GAUZE/BANDAGES/DRESSINGS) ×3 IMPLANT
GAUZE SPONGE 4X4 16PLY XRAY LF (GAUZE/BANDAGES/DRESSINGS) IMPLANT
GAUZE XEROFORM 1X8 LF (GAUZE/BANDAGES/DRESSINGS) ×3 IMPLANT
GLOVE BIO SURGEON STRL SZ7.5 (GLOVE) ×3 IMPLANT
GLOVE BIOGEL PI IND STRL 8 (GLOVE) ×2 IMPLANT
GLOVE BIOGEL PI IND STRL 8.5 (GLOVE) IMPLANT
GLOVE BIOGEL PI INDICATOR 8 (GLOVE) ×1
GLOVE BIOGEL PI INDICATOR 8.5 (GLOVE)
GLOVE SURG ORTHO 8.0 STRL STRW (GLOVE) IMPLANT
GOWN STRL REUS W/ TWL LRG LVL3 (GOWN DISPOSABLE) ×2 IMPLANT
GOWN STRL REUS W/TWL LRG LVL3 (GOWN DISPOSABLE) ×1
GOWN STRL REUS W/TWL XL LVL3 (GOWN DISPOSABLE) ×3 IMPLANT
K-WIRE .035X4 (WIRE) IMPLANT
LOOP VESSEL MAXI BLUE (MISCELLANEOUS) IMPLANT
NEEDLE HYPO 25X1 1.5 SAFETY (NEEDLE) IMPLANT
NEEDLE KEITH (NEEDLE) IMPLANT
NS IRRIG 1000ML POUR BTL (IV SOLUTION) ×3 IMPLANT
PACK BASIN DAY SURGERY FS (CUSTOM PROCEDURE TRAY) ×3 IMPLANT
PAD CAST 3X4 CTTN HI CHSV (CAST SUPPLIES) ×2 IMPLANT
PAD CAST 4YDX4 CTTN HI CHSV (CAST SUPPLIES) IMPLANT
PADDING CAST ABS 3INX4YD NS (CAST SUPPLIES)
PADDING CAST ABS 4INX4YD NS (CAST SUPPLIES) ×1
PADDING CAST ABS COTTON 3X4 (CAST SUPPLIES) IMPLANT
PADDING CAST ABS COTTON 4X4 ST (CAST SUPPLIES) ×2 IMPLANT
PADDING CAST COTTON 3X4 STRL (CAST SUPPLIES) ×1
PADDING CAST COTTON 4X4 STRL (CAST SUPPLIES)
SLEEVE SCD COMPRESS KNEE MED (MISCELLANEOUS) IMPLANT
SPLINT PLASTER CAST XFAST 3X15 (CAST SUPPLIES) IMPLANT
SPLINT PLASTER CAST XFAST 4X15 (CAST SUPPLIES) IMPLANT
SPLINT PLASTER XTRA FAST SET 4 (CAST SUPPLIES)
SPLINT PLASTER XTRA FASTSET 3X (CAST SUPPLIES)
STOCKINETTE 4X48 STRL (DRAPES) ×3 IMPLANT
SUT CHROMIC 5 0 P 3 (SUTURE) IMPLANT
SUT ETHIBOND 3-0 V-5 (SUTURE) IMPLANT
SUT ETHILON 3 0 PS 1 (SUTURE) IMPLANT
SUT ETHILON 4 0 PS 2 18 (SUTURE) IMPLANT
SUT FIBERWIRE 4-0 18 DIAM BLUE (SUTURE)
SUT MERSILENE 2.0 SH NDLE (SUTURE) IMPLANT
SUT MERSILENE 4 0 P 3 (SUTURE) IMPLANT
SUT PROLENE 2 0 SH DA (SUTURE) IMPLANT
SUT PROLENE 5 0 P 3 (SUTURE) IMPLANT
SUT SILK 4 0 PS 2 (SUTURE) IMPLANT
SUT SUPRAMID 4-0 (SUTURE) IMPLANT
SUT VIC AB 4-0 P-3 18XBRD (SUTURE) IMPLANT
SUT VIC AB 4-0 P3 18 (SUTURE)
SUT VICRYL 4-0 PS2 18IN ABS (SUTURE) IMPLANT
SUTURE FIBERWR 4-0 18 DIA BLUE (SUTURE) IMPLANT
SYR BULB 3OZ (MISCELLANEOUS) ×3 IMPLANT
SYR CONTROL 10ML LL (SYRINGE) IMPLANT
TOWEL OR 17X24 6PK STRL BLUE (TOWEL DISPOSABLE) ×6 IMPLANT
TUBE FEEDING 5FR 15 INCH (TUBING) IMPLANT
UNDERPAD 30X30 (UNDERPADS AND DIAPERS) ×3 IMPLANT

## 2015-05-10 NOTE — Progress Notes (Signed)
Surgery cancelled due to several skin ulcerations and more medical issues that Dr. Ivin Bootyrews was not  aware of from forms received [facility]. . Also per pt. Needs hoyer lift to get in and out of bed. Not available Day of surgery. Pt made aware that Dr. Merrilee SeashoreKuzma's office will call to reschedule at main OR. Also [facility] made aware. Info sent with pt.

## 2015-05-10 NOTE — Anesthesia Preprocedure Evaluation (Addendum)
Anesthesia Evaluation  Patient identified by MRN, date of birth, ID band Patient awake    Reviewed: Allergy & Precautions, H&P , NPO status , Patient's Chart, lab work & pertinent test results  Airway Mallampati: II   Neck ROM: full    Dental   Pulmonary sleep apnea ,    breath sounds clear to auscultation       Cardiovascular hypertension,  Rhythm:regular Rate:Normal     Neuro/Psych PSYCHIATRIC DISORDERS Anxiety Depression    GI/Hepatic   Endo/Other  diabetes, Type 2, Oral Hypoglycemic AgentsHypothyroidism Morbid obesity  Renal/GU      Musculoskeletal   Abdominal   Peds  Hematology   Anesthesia Other Findings   Reproductive/Obstetrics                            Anesthesia Physical Anesthesia Plan  ASA: II  Anesthesia Plan: General   Post-op Pain Management:    Induction: Intravenous  Airway Management Planned: LMA  Additional Equipment:   Intra-op Plan:   Post-operative Plan:   Informed Consent: I have reviewed the patients History and Physical, chart, labs and discussed the procedure including the risks, benefits and alternatives for the proposed anesthesia with the patient or authorized representative who has indicated his/her understanding and acceptance.     Plan Discussed with: CRNA, Anesthesiologist and Surgeon  Anesthesia Plan Comments:         Anesthesia Quick Evaluation

## 2015-05-11 ENCOUNTER — Other Ambulatory Visit: Payer: Self-pay | Admitting: Orthopedic Surgery

## 2015-05-14 ENCOUNTER — Encounter: Payer: Self-pay | Admitting: Family

## 2015-05-14 ENCOUNTER — Encounter (HOSPITAL_COMMUNITY): Payer: Self-pay | Admitting: *Deleted

## 2015-05-14 ENCOUNTER — Non-Acute Institutional Stay (SKILLED_NURSING_FACILITY): Payer: 59 | Admitting: Family

## 2015-05-14 DIAGNOSIS — G894 Chronic pain syndrome: Secondary | ICD-10-CM

## 2015-05-14 DIAGNOSIS — R21 Rash and other nonspecific skin eruption: Secondary | ICD-10-CM | POA: Diagnosis not present

## 2015-05-14 MED ORDER — PRAMOXINE-CALAMINE 1-3 % EX CREA: 1.0000 | TOPICAL_CREAM | Freq: Three times a day (TID) | CUTANEOUS | Status: AC

## 2015-05-14 MED ORDER — CEFAZOLIN SODIUM-DEXTROSE 2-3 GM-% IV SOLR
2.0000 g | INTRAVENOUS | Status: AC
  Administered 2015-05-15: 2000 mg via INTRAVENOUS

## 2015-05-14 MED ORDER — OXYCODONE HCL 10 MG PO TABS: 10.0000 mg | ORAL_TABLET | Freq: Four times a day (QID) | ORAL | Status: DC | PRN

## 2015-05-14 NOTE — Progress Notes (Signed)
Location:  [facility] Room Number: 509 Place of Service:  SNF [phone]) Provider:  Oneal Grout, MD   No primary care provider on file.  No care team member to display  Extended Emergency Contact Information Primary Emergency Contact: Menge,Julie Address: [address]          [place], [place] [address] [place] Mobile Phone: [phone] Relation: Sister Secondary Emergency Contact: Alcide Even[place] Mobile Phone: [phone] Relation: Sister  Code Status: Full Code  Goals of care: Advanced Directive information Advanced Directives 05/14/2015  Does patient have an advance directive? No  Would patient like information on creating an advanced directive? No - patient declined information     Chief Complaint  Patient presents with  . Acute Visit    HPI:  Pt is a 55 y.o. female seen today at [facility] for an acute visit for evaluation of rash. He has a medical history of HTN, Depression, Venous statis, Type 2 DM, Chronic pain, anxiety among others. He is seen in his room today.He complains of itchy rash on the chest and back for the past four days. He states he has been scratching with a back scratcher but seems to be getting worse. He denies any fever, chills, cough or change of lotion. No recent new medication. He also request his pain medication frequency to be changed back to every 6 hours instead of every 8 Hrs states has been taking pain medications every 6 hours at home and it seems to relief the pain much better. He states current pain medication reliefs pain but has too wait too long for the next due time.    Past Medical History  Diagnosis Date  . Chicken pox   . Depression   . Venous stasis   . HTN (hypertension)     History of; resolved following bariatric surgery  . History of sleep apnea   . History of malnutrition   . History of vitamin A deficiency   . Sleep apnea    no CPAP after losing weight  . DM type 2 (diabetes mellitus, type 2) (HCC) 2006    Essentially resolved following bariatric surgery, no meds  . Anxiety   . Chronic pain   . Anemia     iron deficient   Past Surgical History  Procedure Laterality Date  . Cholecystectomy  1991  . Appendectomy  1991  . Tonsillectomy and adenoidectomy  1967  . Severe malnutrition  2014  . Below knee leg amputation Right 2014  . Hernia repair  2009  . Gastric sleeve, biliopancreatic diversion, duodenal switch  2006    No Known Allergies    Medication List       This list is accurate as of: 05/14/15 10:29 AM.  Always use your most recent med list.               acetaminophen 325 MG tablet  Commonly known as:  TYLENOL  Take 2 tablets (650 mg total) by mouth every 6 (six) hours as needed for mild pain (or Fever >/= 101).     calcium carbonate 1500 (600 Ca) MG Tabs tablet  Commonly known as:  OSCAL  Take 600 mg of elemental calcium by mouth 2 (two) times daily with a meal.     citalopram 10 MG tablet  Commonly known as:  CELEXA  Take 10 mg by mouth daily. For depression     docusate  sodium 100 MG capsule  Commonly known as:  COLACE  Take 100 mg by mouth daily. For constipation     DULoxetine 20 MG capsule  Commonly known as:  CYMBALTA  Take 20 mg by mouth daily.     ergocalciferol 50000 units capsule  Commonly known as:  VITAMIN D2  Take 50,000 Units by mouth once a week.     ferrous sulfate 325 (65 FE) MG tablet  Take 325 mg by mouth 2 (two) times daily.     folic acid 1 MG tablet  Commonly known as:  FOLVITE  Take 1 mg by mouth daily.     levothyroxine 25 MCG tablet  Commonly known as:  SYNTHROID, LEVOTHROID  Take 25 mcg by mouth daily before breakfast. For hypothyroidism     levothyroxine 200 MCG tablet  Commonly known as:  SYNTHROID, LEVOTHROID  TAKE ONE TABLET BY MOUTH DAILY     Melatonin 5 MG Tabs  Take 5 mg by mouth at bedtime. For insomnia     mirtazapine 7.5 MG  tablet  Commonly known as:  REMERON  Take 7.5 mg by mouth at bedtime. For insomnia     morphine 15 MG 12 hr tablet  Commonly known as:  MS CONTIN  Take 15 mg by mouth every 12 (twelve) hours. For pain     Oxycodone HCl 10 MG Tabs  Take 1 tablet (10 mg total) by mouth every 8 (eight) hours as needed (for breakthrough pain).     pantoprazole 40 MG tablet  Commonly known as:  PROTONIX  Take 40 mg by mouth daily. For GERD     torsemide 20 MG tablet  Commonly known as:  DEMADEX  Take 3 tablets (60 mg total) by mouth daily. Currently takes 1.5 tabs daily        Review of Systems  Constitutional: Negative for fever, chills, activity change, appetite change and fatigue.  HENT: Negative for congestion, sinus pressure, sneezing, sore throat, trouble swallowing and voice change.   Eyes: Negative for discharge, redness, itching and visual disturbance.  Respiratory: Negative for cough, chest tightness, shortness of breath, wheezing and stridor.   Cardiovascular: Negative for chest pain, palpitations and leg swelling.  Gastrointestinal: Negative for nausea, vomiting, abdominal pain, diarrhea, constipation and abdominal distention.  Genitourinary: Negative.   Musculoskeletal: Positive for gait problem.  Skin: Positive for rash. Negative for color change, pallor and wound.  Psychiatric/Behavioral: Negative.     Immunization History  Administered Date(s) Administered  . PPD Test 05/01/2015   Pertinent  Health Maintenance Due  Topic Date Due  . FOOT EXAM  11/10/1971  . OPHTHALMOLOGY EXAM  11/10/1971  . URINE MICROALBUMIN  11/10/1971  . INFLUENZA VACCINE  01/17/2016 (Originally 09/25/2014)  . HEMOGLOBIN A1C  10/29/2015  . COLONOSCOPY  01/17/2023   No flowsheet data found. Functional Status Survey:    Filed Vitals:   05/14/15 0959  BP: 100/63  Pulse: 76  Temp: 98.3 F (36.8 C)  TempSrc: Oral  Resp: 20  Height: 6' (1.829 m)  Weight: 218 lb 8 oz (99.111 kg)  SpO2: 94%   Body  mass index is 29.63 kg/(m^2). Physical Exam  Constitutional: He is oriented to person, place, and time. He appears well-developed and well-nourished. No distress.  HENT:  Head: Normocephalic.  Mouth/Throat: Oropharynx is clear and moist.  Eyes: Conjunctivae and EOM are normal. Pupils are equal, round, and reactive to light. Right eye exhibits no discharge. Left eye exhibits no discharge. No scleral icterus.  Neck: Normal range of motion. No JVD present.  Cardiovascular: Normal rate, regular rhythm, normal heart sounds and intact distal pulses.  Exam reveals no gallop and no friction rub.   No murmur heard. Pulmonary/Chest: Effort normal and breath sounds normal. No respiratory distress. He has no wheezes. He has no rales.  Abdominal: Soft. Bowel sounds are normal. He exhibits no distension and no mass. There is no tenderness. There is no rebound and no guarding.  Lymphadenopathy:    He has no cervical adenopathy.  Neurological: He is oriented to person, place, and time.  Skin: Skin is warm and dry. No erythema.   multiple red scratch marks noted on sides and upper back.No rash noted.   Mid and left chest chronic quarter size ulcer open to air  dry, clean and without any drainage or signs of infections. Sacral and leg leg ulcers drsg clean , dry and intact managed by wound nurse. Progressive healing.    Psychiatric: He has a normal mood and affect.    Labs reviewed:  Recent Labs  04/28/15 0750 04/29/15 0620 05/01/15 05/01/15 0615 05/07/15  NA 139 140 139 139 138  K 3.2* 3.9  --  4.4 4.5  CL 103 106  --  107  --   CO2 27 27  --  25  --   GLUCOSE 133* 102*  --  100*  --   BUN 13 12 10 10 8   CREATININE 0.82 0.79 0.8 0.79 0.6  CALCIUM 7.3* 7.3*  --  7.4*  --   MG 1.6*  --   --   --   --   PHOS 2.2*  --   --   --   --     Recent Labs  01/17/15 0914 04/28/15 0750 05/07/15  AST 21 20 24   ALT 22 15* 14  ALKPHOS 286* 213* 372*  BILITOT 0.5 0.7  --   PROT 6.5 5.6*  --   ALBUMIN  3.2*  3.2* 2.2*  --     Recent Labs  04/27/15 1702 04/28/15 0750 04/29/15 0620 05/01/15 05/01/15 0615 05/07/15  WBC 7.9 6.9  --  5.8 5.8 6.0  NEUTROABS  --  5.2  --   --   --   --   HGB 10.4* 8.5* 8.4*  --  8.7* 9.4*  HCT 32.7* 28.1* 27.4*  --  28.1* 32*  MCV 94.0 93.0  --   --  95.3  --   PLT 361 308  --   --  283 312   Lab Results  Component Value Date   TSH 3.75 01/17/2015   Lab Results  Component Value Date   HGBA1C 5.1 04/28/2015   Lab Results  Component Value Date   CHOL 72 01/17/2015   HDL 38.40* 01/17/2015   LDLCALC 24 01/17/2015   TRIG 47.0 01/17/2015   CHOLHDL 2 01/17/2015    Significant Diagnostic Results in last 30 days:  US Scrotum  04/28/2015  CLINICAL DATA:  Subacute onset of scrotal swelling, status post traumatic injury. Initial encounter. EXAM: SCROTAL ULTRASOUND DOPPLER ULTRASOUND OF THE TESTICLES TECHNIQUE: Complete ultrasound examination of the testicles, epididymis, and other scrotal structures was performed. Color and spectral Doppler ultrasound were also utilized to evaluate blood flow to the testicles. COMPARISON:  CT of the abdomen and pelvis performed earlier today at 1:06 a.m. FINDINGS: Right testicle Measurements: 2.5 x 1.5 x 2.0 cm. No mass or microlithiasis visualized. Left testicle Measurements: 2.3 x 1.6 x 1.8 cm. No mass  or microlithiasis visualized. Right epididymis:  Not visualized. Left epididymis:  Not visualized. Hydrocele:  None visualized. Varicocele:  None visualized. Pulsed Doppler interrogation of both testes demonstrates normal low resistance arterial and venous waveforms bilaterally. Vague soft tissue edema is noted at the scrotum. There is also extensive fat about the scrotal wall. IMPRESSION: 1. No evidence of testicular torsion. 2. Mild vague soft tissue edema noted at the scrotum. Electronically Signed   By: Roanna Raider M.D.   On: 04/28/2015 07:14   Ct Abdomen Pelvis W Contrast  04/28/2015  CLINICAL DATA:  Status post fall  while walking 1 week ago, with bilateral abdominal pain and scrotal swelling. Initial encounter. EXAM: CT ABDOMEN AND PELVIS WITH CONTRAST TECHNIQUE: Multidetector CT imaging of the abdomen and pelvis was performed using the standard protocol following bolus administration of intravenous contrast. CONTRAST:  80 mL of Omnipaque 300 IV contrast COMPARISON:  Right upper quadrant ultrasound performed 12/05/2013 FINDINGS: Mild bibasilar atelectasis is noted. Diffuse fatty infiltration is noted within the liver. The spleen is unremarkable in appearance. The patient is status post cholecystectomy, with clips noted along the gallbladder fossa. 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 The pancreas and adrenal glands are unremarkable. The kidneys are unremarkable in appearance. There is no evidence of hydronephrosis. No renal or ureteral stones are seen. Minimal nonspecific perinephric stranding is noted bilaterally. No free fluid is identified. The small bowel is unremarkable in appearance. The stomach is within normal limits. No acute vascular abnormalities are seen. Mild scattered calcification is noted along the abdominal aorta and its branches. Diffuse soft tissue inflammation and edema are noted about the abdominal and pelvic wall, raising concern for soft tissue injury. This extends about the soft tissues superior to the scrotum. Cellulitis could have such an appearance. The appendix is grossly unremarkable in appearance, without evidence of appendicitis. The colon is unremarkable in appearance. The bladder is mildly distended and grossly remarkable. The prostate remains normal in size. No inguinal lymphadenopathy is seen. No acute osseous abnormalities are identified. There is chronic compression deformity involving vertebral body T7. Diffuse osteopenia of visualized osseous structures is noted. IMPRESSION: 1. Diffuse soft tissue inflammation and edema about  the abdominal and pelvic wall, raising concern for soft tissue injury given recent fall. This extends about the soft tissues superior to the scrotum. Cellulitis could have such an appearance. 2. Mild scattered calcification along the abdominal aorta and its branches. 3. Diffuse fatty infiltration within the liver. 4. Mild bibasilar atelectasis noted. 5. Chronic compression deformity of vertebral body T7, with diffuse osteopenia of visualized osseous structures Electronically Signed   By: Roanna Raider M.D.   On: 04/28/2015 02:22   Ct Finger Right Wo Contrast  04/30/2015  CLINICAL DATA:  Volar plate avulsion injury of the right hand. EXAM: CT OF THE RIGHT FINGERS WITHOUT CONTRAST TECHNIQUE: Multidetector CT imaging was performed according to the standard protocol. Multiplanar CT image reconstructions were also generated. COMPARISON:  None. FINDINGS: There is a comminuted fracture of the volar base of the fourth distal phalanx. There is a sliver of bone along the volar base of the fourth middle phalanx without a donor site suggesting an avulsed fragment from the fourth distal phalanx with 2.4 cm of retraction of the flexor digitorum profundus tendon. There is no other fracture or dislocation. There is no lytic or sclerotic osseous lesion. There is no fluid collection or hematoma. IMPRESSION: Comminuted fracture of the volar base of the fourth  distal phalanx. Sliver of bone along the volar base of the fourth middle phalanx without a donor site suggesting an avulsed fragment from the fourth distal phalanx with 2.4 cm of retraction of the flexor digitorum profundus tendon. Electronically Signed   By: Elige KoHetal  Patel   On: 04/30/2015 14:48   [place] Art/ven Flow Abd Pelv Doppler  04/28/2015  CLINICAL DATA:  Subacute onset of scrotal swelling, status post traumatic injury. Initial encounter. EXAM: SCROTAL ULTRASOUND DOPPLER ULTRASOUND OF THE TESTICLES TECHNIQUE: Complete ultrasound examination of the testicles, epididymis,  and other scrotal structures was performed. Color and spectral Doppler ultrasound were also utilized to evaluate blood flow to the testicles. COMPARISON:  CT of the abdomen and pelvis performed earlier today at 1:06 a.m. FINDINGS: Right testicle Measurements: 2.5 x 1.5 x 2.0 cm. No mass or microlithiasis visualized. Left testicle Measurements: 2.3 x 1.6 x 1.8 cm. No mass or microlithiasis visualized. Right epididymis:  Not visualized. Left epididymis:  Not visualized. Hydrocele:  None visualized. Varicocele:  None visualized. Pulsed Doppler interrogation of both testes demonstrates normal low resistance arterial and venous waveforms bilaterally. Vague soft tissue edema is noted at the scrotum. There is also extensive fat about the scrotal wall. IMPRESSION: 1. No evidence of testicular torsion. 2. Mild vague soft tissue edema noted at the scrotum. Electronically Signed   By: Roanna RaiderJeffery  Chang M.D.   On: 04/28/2015 07:14   Dg Hand Complete Right  04/28/2015  CLINICAL DATA:  Fall 1 week ago. Right hand pain and decreased range of motion. Initial encounter. EXAM: RIGHT HAND - COMPLETE 3+ VIEW COMPARISON:  None. FINDINGS: Mildly displaced fractures are seen involving the proximal volar margins of the proximal and middle phalanges of the ring finger. No evidence of dislocation. No other fractures identified. IMPRESSION: Volar plate avulsion fractures involving the proximal and middle phalanges of the ring finger. Electronically Signed   By: Myles RosenthalJohn  Stahl M.D.   On: 04/28/2015 14:32   Dg Hip Unilat With Pelvis 2-3 Views Left  04/28/2015  CLINICAL DATA:  Pain EXAM: DG HIP (WITH OR WITHOUT PELVIS) 2-3V LEFT COMPARISON:  None FINDINGS: Retained contrast in a distended bladder. Diffuse osseous demineralization. Hip joint spaces preserved. Scattered soft tissue and stool artifacts. No definite fracture or bone destruction. IMPRESSION: Osseous demineralization without definite acute bony abnormalities. Electronically Signed   By:  Ulyses SouthwardMark  Boles M.D.   On: 04/28/2015 14:30    Assessment/Plan  Chronic pain Syndrome. Request Oxycodone frequency changed to every six hours states waits too long for the next pain medication to be administered. Rates Right BKA [place] pain 8/10 on scale. Change frequency oxycodone to every 6 hrs PRN.   Rash  Multiple scratch marks to chest sides and back. Ordering Aveeno anti-itch cream apply to affected areas on the chest and back every 8 hours.    Family/ staff Communication: Reviewed plan of care with patient and Facility Nurse supervisor.   Labs/tests ordered:  None

## 2015-05-14 NOTE — Progress Notes (Signed)
Anesthesia Chart Review: SAME DAY WORK-UP.  Patient is a 55 year old female scheduled for right ring finger repair flexor digitorum profundus avulsion and distal phalanx fracture on 05/15/15 by Dr. Fredna Dow.   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 Admitted 04/27/15-05/01/15 for scrotal cellulitis treated with Ancef-->cephalexin. He had fallen 2 weeks prior to admission. Reportedly, sustained a right finger fracture and was scheduled to see a Copy. Dr. Fredna Dow notified during patient's admission. CT scan of the the right fingers recommended (see below) with planned out-patient follow-up. Surgery now recommended. He is currently residing at [facility] (new admission 05/01/15).   Med include Celexa, Cymbalta, 65 Fe, folic acid, levothyroxine, melatonin, Remeron, MS Contin, oxycodone, torsemide.    04/02/14 EKG: NSR. Low voltage QRS, possible anterolateral infarct (cited on or before 11/27/13).When compared to 11/27/13 tracing, questionable change in initial forces of lateral leads, non-specific T wave abnormality no longer event in lateral leads. Tracing is > 58 year old, so he will need an updated tracing on the day of surgery.  04/30/15 CT right fingers:  IMPRESSION: Comminuted fracture of the volar base of the fourth distal phalanx. Sliver of bone along the volar base of the fourth middle phalanx without a donor site suggesting an avulsed fragment from the fourth distal phalanx with 2.4 cm of retraction of the flexor digitorum profundus tendon.  Labs from 04/27/15-05/07/15 noted. Has had isolated elevation of Alk Phos (AST/ALT, total bili WNL), finger fracture likely contributing. Also has fatty liver on 04/28/15. PLT WNL. HGB  has been in the 8.4-10 range most recently. A1c 04/28/15 was 5.1.   Plan EKG, CBC, CMET on arrival tomorrow. He is a same day work-up, so further evaluation and definitive anesthesia tomorrow.   [facility] Short Stay Center/Anesthesiology Phone [phone] 05/14/2015 3:03 PM

## 2015-05-14 NOTE — Progress Notes (Signed)
Pt is a resident at [facility]. Spoke with Leavy CellaJasmine, nurse for Mr. [name] for pre-op call. Pt just here within 2 weeks ago. Jasmine states pt is alert and oriented and can speak for himself. Requested that she fax the pt's MAR to us. She states pt has not had any complaints of chest pain or sob. No known cardiac history found in medical record.

## 2015-05-15 ENCOUNTER — Ambulatory Visit (HOSPITAL_COMMUNITY): Payer: 59 | Admitting: Vascular Surgery

## 2015-05-15 ENCOUNTER — Ambulatory Visit (HOSPITAL_COMMUNITY)
Admission: RE | Admit: 2015-05-15 | Discharge: 2015-05-15 | Disposition: A | Discharge status: Rehabilitation Facility | Payer: 59 | Source: Ambulatory Visit | Attending: Orthopedic Surgery | Admitting: Orthopedic Surgery

## 2015-05-15 ENCOUNTER — Encounter (HOSPITAL_COMMUNITY)
Admission: RE | Disposition: A | Discharge status: Rehabilitation Facility | Payer: Self-pay | Source: Ambulatory Visit | Attending: Orthopedic Surgery

## 2015-05-15 ENCOUNTER — Encounter (HOSPITAL_COMMUNITY): Payer: Self-pay | Admitting: *Deleted

## 2015-05-15 DIAGNOSIS — G8929 Other chronic pain: Secondary | ICD-10-CM | POA: Insufficient documentation

## 2015-05-15 DIAGNOSIS — Z89511 Acquired absence of right leg below knee: Secondary | ICD-10-CM | POA: Insufficient documentation

## 2015-05-15 DIAGNOSIS — E119 Type 2 diabetes mellitus without complications: Secondary | ICD-10-CM | POA: Diagnosis not present

## 2015-05-15 DIAGNOSIS — S62634A Displaced fracture of distal phalanx of right ring finger, initial encounter for closed fracture: Secondary | ICD-10-CM | POA: Diagnosis present

## 2015-05-15 DIAGNOSIS — X58XXXA Exposure to other specified factors, initial encounter: Secondary | ICD-10-CM | POA: Insufficient documentation

## 2015-05-15 DIAGNOSIS — S66194A Other injury of flexor muscle, fascia and tendon of right ring finger at wrist and hand level, initial encounter: Secondary | ICD-10-CM | POA: Diagnosis not present

## 2015-05-15 DIAGNOSIS — I10 Essential (primary) hypertension: Secondary | ICD-10-CM | POA: Insufficient documentation

## 2015-05-15 LAB — CBC
HCT: 35.6 % — ABNORMAL LOW (ref 39.0–52.0)
HEMOGLOBIN: 10.7 g/dL — AB (ref 13.0–17.0)
MCH: 28.3 pg (ref 26.0–34.0)
MCHC: 30.1 g/dL (ref 30.0–36.0)
MCV: 94.2 fL (ref 78.0–100.0)
Platelets: 334 10*3/uL (ref 150–400)
RBC: 3.78 MIL/uL — AB (ref 4.22–5.81)
RDW: 16.1 % — ABNORMAL HIGH (ref 11.5–15.5)
WBC: 7.3 10*3/uL (ref 4.0–10.5)

## 2015-05-15 LAB — GLUCOSE, CAPILLARY
GLUCOSE-CAPILLARY: 86 mg/dL (ref 65–99)
Glucose-Capillary: 77 mg/dL (ref 65–99)

## 2015-05-15 LAB — COMPREHENSIVE METABOLIC PANEL
ALBUMIN: 2.3 g/dL — AB (ref 3.5–5.0)
ALK PHOS: 382 U/L — AB (ref 38–126)
ALT: 21 U/L (ref 17–63)
AST: 33 U/L (ref 15–41)
Anion gap: 7 (ref 5–15)
BUN: 9 mg/dL (ref 6–20)
CALCIUM: 7.9 mg/dL — AB (ref 8.9–10.3)
CHLORIDE: 105 mmol/L (ref 101–111)
CO2: 28 mmol/L (ref 22–32)
CREATININE: 0.79 mg/dL (ref 0.61–1.24)
GFR calc Af Amer: >60 mL/min (ref >60)
GFR calc non Af Amer: >60 mL/min (ref >60)
GLUCOSE: 90 mg/dL (ref 65–99)
Potassium: 4.1 mmol/L (ref 3.5–5.1)
SODIUM: 140 mmol/L (ref 135–145)
Total Bilirubin: 0.6 mg/dL (ref 0.3–1.2)
Total Protein: 6.4 g/dL — ABNORMAL LOW (ref 6.5–8.1)

## 2015-05-15 SURGERY — TRANSFER, TENDON, FLEXOR DIGITORUM LONGUS TO NAVICULAR BONE OF FOOT
Anesthesia: General | Site: Finger | Laterality: Right

## 2015-05-15 MED ORDER — OXYCODONE HCL 5 MG PO TABS: ORAL_TABLET | ORAL | Status: DC

## 2015-05-15 MED ORDER — MIDAZOLAM HCL 2 MG/2ML IJ SOLN
INTRAMUSCULAR | Status: AC
  Filled 2015-05-15: qty 2

## 2015-05-15 MED ORDER — LIDOCAINE HCL (CARDIAC) 20 MG/ML IV SOLN
INTRAVENOUS | Status: DC | PRN
  Administered 2015-05-15: 70 mg via INTRAVENOUS

## 2015-05-15 MED ORDER — MIDAZOLAM HCL 5 MG/5ML IJ SOLN
INTRAMUSCULAR | Status: DC | PRN
  Administered 2015-05-15: 2 mg via INTRAVENOUS

## 2015-05-15 MED ORDER — FENTANYL CITRATE (PF) 250 MCG/5ML IJ SOLN
INTRAMUSCULAR | Status: AC
  Filled 2015-05-15: qty 5

## 2015-05-15 MED ORDER — PROPOFOL 10 MG/ML IV BOLUS
INTRAVENOUS | Status: DC | PRN
  Administered 2015-05-15: 150 mg via INTRAVENOUS

## 2015-05-15 MED ORDER — OXYCODONE HCL 5 MG PO TABS: 5.0000 mg | ORAL_TABLET | Freq: Once | ORAL | Status: DC | PRN

## 2015-05-15 MED ORDER — OXYCODONE HCL 5 MG/5ML PO SOLN: 5.0000 mg | Freq: Once | ORAL | Status: DC | PRN

## 2015-05-15 MED ORDER — FENTANYL CITRATE (PF) 100 MCG/2ML IJ SOLN: 25.0000 ug | INTRAMUSCULAR | Status: DC | PRN

## 2015-05-15 MED ORDER — LACTATED RINGERS IV SOLN
INTRAVENOUS | Status: DC
  Administered 2015-05-15 (×2): via INTRAVENOUS

## 2015-05-15 MED ORDER — 0.9 % SODIUM CHLORIDE (POUR BTL) OPTIME
TOPICAL | Status: DC | PRN
  Administered 2015-05-15: 1000 mL

## 2015-05-15 MED ORDER — ONDANSETRON HCL 4 MG/2ML IJ SOLN: 4.0000 mg | Freq: Four times a day (QID) | INTRAMUSCULAR | Status: DC | PRN

## 2015-05-15 MED ORDER — BUPIVACAINE HCL (PF) 0.25 % IJ SOLN
INTRAMUSCULAR | Status: DC | PRN
  Administered 2015-05-15: 6 mL

## 2015-05-15 MED ORDER — ONDANSETRON HCL 4 MG/2ML IJ SOLN
INTRAMUSCULAR | Status: AC
  Filled 2015-05-15: qty 2

## 2015-05-15 MED ORDER — BUPIVACAINE HCL (PF) 0.25 % IJ SOLN
INTRAMUSCULAR | Status: AC
  Filled 2015-05-15: qty 30

## 2015-05-15 MED ORDER — FENTANYL CITRATE (PF) 100 MCG/2ML IJ SOLN
INTRAMUSCULAR | Status: DC | PRN
  Administered 2015-05-15 (×2): 50 ug via INTRAVENOUS
  Administered 2015-05-15: 150 ug via INTRAVENOUS
  Administered 2015-05-15: 50 ug via INTRAVENOUS

## 2015-05-15 MED ORDER — ONDANSETRON HCL 4 MG/2ML IJ SOLN
INTRAMUSCULAR | Status: DC | PRN
  Administered 2015-05-15: 4 mg via INTRAVENOUS

## 2015-05-15 MED ORDER — CHLORHEXIDINE GLUCONATE 4 % EX LIQD: 60.0000 mL | Freq: Once | CUTANEOUS | Status: DC

## 2015-05-15 SURGICAL SUPPLY — 51 items
BANDAGE ACE 3X5.8 VEL STRL LF (GAUZE/BANDAGES/DRESSINGS) ×3 IMPLANT
BANDAGE COBAN STERILE 2 (GAUZE/BANDAGES/DRESSINGS) IMPLANT
BLADE MINI RND TIP GREEN BEAV (BLADE) IMPLANT
BNDG COHESIVE 1X5 TAN STRL LF (GAUZE/BANDAGES/DRESSINGS) ×3 IMPLANT
BNDG COHESIVE 3X5 TAN STRL LF (GAUZE/BANDAGES/DRESSINGS) IMPLANT
BNDG ESMARK 4X9 LF (GAUZE/BANDAGES/DRESSINGS) ×3 IMPLANT
BNDG GAUZE ELAST 4 BULKY (GAUZE/BANDAGES/DRESSINGS) ×3 IMPLANT
CHLORAPREP W/TINT 26ML (MISCELLANEOUS) ×3 IMPLANT
CORDS BIPOLAR (ELECTRODE) ×3 IMPLANT
CUFF TOURNIQUET SINGLE 18IN (TOURNIQUET CUFF) ×3 IMPLANT
CUFF TOURNIQUET SINGLE 24IN (TOURNIQUET CUFF) IMPLANT
DECANTER SPIKE VIAL GLASS SM (MISCELLANEOUS) IMPLANT
DRAPE OEC MINIVIEW 54X84 (DRAPES) ×3 IMPLANT
DRSG KUZMA FLUFF (GAUZE/BANDAGES/DRESSINGS) IMPLANT
DURAPREP 26ML APPLICATOR (WOUND CARE) IMPLANT
GAUZE SPONGE 2X2 8PLY STRL LF (GAUZE/BANDAGES/DRESSINGS) IMPLANT
GAUZE SPONGE 4X4 12PLY STRL (GAUZE/BANDAGES/DRESSINGS) ×3 IMPLANT
GAUZE XEROFORM 1X8 LF (GAUZE/BANDAGES/DRESSINGS) ×3 IMPLANT
GLOVE BIO SURGEON STRL SZ7.5 (GLOVE) ×3 IMPLANT
GOWN STRL REUS W/ TWL LRG LVL3 (GOWN DISPOSABLE) ×3 IMPLANT
GOWN STRL REUS W/ TWL XL LVL3 (GOWN DISPOSABLE) ×1 IMPLANT
GOWN STRL REUS W/TWL LRG LVL3 (GOWN DISPOSABLE) ×6
GOWN STRL REUS W/TWL XL LVL3 (GOWN DISPOSABLE) ×2
KIT BASIN OR (CUSTOM PROCEDURE TRAY) ×3 IMPLANT
KIT ROOM TURNOVER OR (KITS) ×3 IMPLANT
MANIFOLD NEPTUNE II (INSTRUMENTS) IMPLANT
NEEDLE 18GX1X1/2 (RX/OR ONLY) (NEEDLE) ×3 IMPLANT
NEEDLE HYPO 25GX1X1/2 BEV (NEEDLE) ×3 IMPLANT
NS IRRIG 1000ML POUR BTL (IV SOLUTION) ×3 IMPLANT
PACK ORTHO EXTREMITY (CUSTOM PROCEDURE TRAY) ×3 IMPLANT
PAD ARMBOARD 7.5X6 YLW CONV (MISCELLANEOUS) ×6 IMPLANT
PAD CAST 4YDX4 CTTN HI CHSV (CAST SUPPLIES) ×2 IMPLANT
PADDING CAST COTTON 4X4 STRL (CAST SUPPLIES) ×4
RUBBERBAND STERILE (MISCELLANEOUS) IMPLANT
SPECIMEN JAR SMALL (MISCELLANEOUS) IMPLANT
SPLINT PLASTER CAST XFAST 3X15 (CAST SUPPLIES) ×1 IMPLANT
SPLINT PLASTER XTRA FASTSET 3X (CAST SUPPLIES) ×2
SPONGE GAUZE 2X2 STER 10/PKG (GAUZE/BANDAGES/DRESSINGS)
SPONGE SCRUB IODOPHOR (GAUZE/BANDAGES/DRESSINGS) IMPLANT
SUT ETHILON 4 0 P 3 18 (SUTURE) ×6 IMPLANT
SUT ETHILON 5 0 PS 2 18 (SUTURE) IMPLANT
SUT PROLENE 2 0 SH DA (SUTURE) ×3 IMPLANT
SUT SILK 2 0 SH (SUTURE) ×3 IMPLANT
SUT SILK 4 0 PS 2 (SUTURE) IMPLANT
SUT VICRYL 4-0 PS2 18IN ABS (SUTURE) IMPLANT
SYR CONTROL 10ML LL (SYRINGE) ×3 IMPLANT
TOWEL OR 17X24 6PK STRL BLUE (TOWEL DISPOSABLE) ×3 IMPLANT
TOWEL OR 17X26 10 PK STRL BLUE (TOWEL DISPOSABLE) ×3 IMPLANT
TUBE FEEDING 5FR 15 INCH (TUBING) IMPLANT
UNDERPAD 30X30 INCONTINENT (UNDERPADS AND DIAPERS) ×3 IMPLANT
WATER STERILE IRR 1000ML POUR (IV SOLUTION) IMPLANT

## 2015-05-15 NOTE — H&P (Signed)
[name] is an 55 y.o. female.   Chief Complaint: right ring finger flexor tendon avulsion with fracture HPI: 55 yo female with injury to right ring finger ~3-4 weeks ago.  Seen during admission for other issues.  XR/CT show fracture of distal phalanx.  Unable to flex dip joint.   Allergies: No Known Allergies  Past Medical History  Diagnosis Date  . Chicken pox   . Depression   . Venous stasis   . HTN (hypertension)     History of; resolved following bariatric surgery  . History of sleep apnea   . History of malnutrition   . History of vitamin A deficiency   . Sleep apnea     no CPAP after losing weight  . DM type 2 (diabetes mellitus, type 2) ([place]) 2006    Essentially resolved following bariatric surgery, no meds  . Anxiety   . Chronic pain   . Anemia     iron deficient    Past Surgical History  Procedure Laterality Date  . Cholecystectomy  1991  . Appendectomy  1991  . Tonsillectomy and adenoidectomy  1967  . Severe malnutrition  2014  . Below knee leg amputation Right 2014  . Hernia repair  2009  . Gastric sleeve, biliopancreatic diversion, duodenal switch  2006    Family History: Family History  Problem Relation Age of Onset  . Arthritis Mother   . Diabetes Mother     Social History:   reports that he has never smoked. He has never used smokeless tobacco. He reports that he does not drink alcohol or use illicit drugs.  Medications: Medications Prior to Admission  Medication Sig Dispense Refill  . Amino Acids-Protein Hydrolys (FEEDING SUPPLEMENT, PRO-STAT SUGAR FREE 64,) LIQD Take 30 mLs by mouth 2 (two) times daily.    . calcium carbonate (OSCAL) 1500 (600 Ca) MG TABS tablet Take 600 mg of elemental calcium by mouth 2 (two) times daily with a meal.    . citalopram (CELEXA) 10 MG tablet Take 10 mg by mouth daily. For depression    . docusate sodium (COLACE) 100 MG capsule Take 100 mg by mouth daily. For constipation    . DULoxetine (CYMBALTA) 20 MG capsule  Take 20 mg by mouth daily.     . ergocalciferol (VITAMIN D2) 50000 units capsule Take 50,000 Units by mouth once a week.    . ferrous sulfate 325 (65 FE) MG tablet Take 325 mg by mouth 2 (two) times daily.     . folic acid (FOLVITE) 1 MG tablet Take 1 mg by mouth daily.     Marland Kitchen levothyroxine (SYNTHROID, LEVOTHROID) 200 MCG tablet TAKE ONE TABLET BY MOUTH DAILY 90 tablet 1  . levothyroxine (SYNTHROID, LEVOTHROID) 25 MCG tablet Take 25 mcg by mouth daily before breakfast. For hypothyroidism    . Melatonin 5 MG TABS Take 5 mg by mouth at bedtime. For insomnia    . mirtazapine (REMERON) 7.5 MG tablet Take 7.5 mg by mouth at bedtime. For insomnia    . morphine (MS CONTIN) 15 MG 12 hr tablet Take 15 mg by mouth every 12 (twelve) hours. For pain    . Multiple Vitamins-Minerals (DECUBI-VITE PO) Take 1 tablet by mouth daily.    . Oxycodone HCl 10 MG TABS Take 1 tablet (10 mg total) by mouth every 6 (six) hours as needed (for breakthrough pain). 15 tablet 0  . pantoprazole (PROTONIX) 40 MG tablet Take 40 mg by mouth daily. For GERD    .  Pramoxine-Calamine (AVEENO ANTI-ITCH) 1-3 % CREA Apply 1 Bottle topically every 8 (eight) hours. 1 Tube 0  . torsemide (DEMADEX) 20 MG tablet Take 3 tablets (60 mg total) by mouth daily. Currently takes 1.5 tabs daily    . vitamin C (ASCORBIC ACID) 500 MG tablet Take 500 mg by mouth daily. FOR 30 DAYS   STOP DATE 06/04/15    . acetaminophen (TYLENOL) 325 MG tablet Take 2 tablets (650 mg total) by mouth every 6 (six) hours as needed for mild pain (or Fever >/= 101).      Results for orders placed or performed during the hospital encounter of 05/15/15 (from the past 48 hour(s))  Glucose, capillary     Status: None   Collection Time: 05/15/15 11:10 AM  Result Value Ref Range   Glucose-Capillary 77 65 - 99 mg/dL  CBC     Status: Abnormal   Collection Time: 05/15/15 11:11 AM  Result Value Ref Range   WBC 7.3 4.0 - 10.5 K/uL   RBC 3.78 (L) 4.22 - 5.81 MIL/uL   Hemoglobin  10.7 (L) 13.0 - 17.0 g/dL   HCT 35.6 (L) 39.0 - 52.0 %   MCV 94.2 78.0 - 100.0 fL   MCH 28.3 26.0 - 34.0 pg   MCHC 30.1 30.0 - 36.0 g/dL   RDW 16.1 (H) 11.5 - 15.5 %   Platelets 334 150 - 400 K/uL  Comprehensive metabolic panel     Status: Abnormal   Collection Time: 05/15/15 11:11 AM  Result Value Ref Range   Sodium 140 135 - 145 mmol/L   Potassium 4.1 3.5 - 5.1 mmol/L   Chloride 105 101 - 111 mmol/L   CO2 28 22 - 32 mmol/L   Glucose, Bld 90 65 - 99 mg/dL   BUN 9 6 - 20 mg/dL   Creatinine, Ser 0.79 0.61 - 1.24 mg/dL   Calcium 7.9 (L) 8.9 - 10.3 mg/dL   Total Protein 6.4 (L) 6.5 - 8.1 g/dL   Albumin 2.3 (L) 3.5 - 5.0 g/dL   AST 33 15 - 41 U/L   ALT 21 17 - 63 U/L   Alkaline Phosphatase 382 (H) 38 - 126 U/L   Total Bilirubin 0.6 0.3 - 1.2 mg/dL   GFR calc non Af Amer >60 >60 mL/min   GFR calc Af Amer >60 >60 mL/min    Comment: (NOTE) The eGFR has been calculated using the CKD EPI equation. This calculation has not been validated in all clinical situations. eGFR's persistently <60 mL/min signify possible Chronic Kidney Disease.    Anion gap 7 5 - 15    No results found.   A comprehensive review of systems was negative.   Blood pressure 103/63, pulse 68, temperature 98.3 F (36.8 C), resp. rate 18, weight 98.884 kg (218 lb), SpO2 99 %.  General appearance: alert, cooperative and appears stated age Head: Normocephalic, without obvious abnormality, atraumatic Neck: supple, symmetrical, trachea midline Resp: clear to auscultation bilaterally Cardio: regular rate and rhythm GI: non-tender Extremities: Intact sensation and capillary refill all digits.  +epl/fpl/io.  No wounds. Unable to flex dip of right ring finger Pulses: 2+ and symmetric Skin: Skin color, texture, turgor normal. No rashes or lesions Neurologic: Grossly normal Incision/Wound: none  Assessment/Plan Right ring finger flexor tendon rupture with distal phalanx fracture.  Plan OR for repair of tendon  and fixation of bone.  Risks, benefits, and alternatives of surgery were discussed and the patient agrees with the plan of care.  [name],[name] 05/15/2015, 1:49 PM

## 2015-05-15 NOTE — Transfer of Care (Signed)
Immediate Anesthesia Transfer of Care Note  Patient: Despina HickKevin Medinger  Procedure(s) Performed: Procedure(s): RIGHT RING FINGER REPAIR FLEXOR DIGITORUM PROFUNDUS AVULSION AND DISTAL PHALANX FRACTURE (Right)  Patient Location: PACU  Anesthesia Type:General  Level of Consciousness: awake, oriented and patient cooperative  Airway & Oxygen Therapy: Patient Spontanous Breathing and Patient connected to nasal cannula oxygen  Post-op Assessment: Report given to RN and Post -op Vital signs reviewed and stable  Post vital signs: Reviewed  Last Vitals:  Filed Vitals:   05/15/15 1110  BP: 103/63  Pulse: 68  Temp: 36.8 C  Resp: 18    Complications: No apparent anesthesia complications

## 2015-05-15 NOTE — Discharge Instructions (Signed)

## 2015-05-15 NOTE — Op Note (Signed)
[id number] 

## 2015-05-15 NOTE — Anesthesia Procedure Notes (Signed)
Procedure Name: LMA Insertion Date/Time: 05/15/2015 2:03 PM Performed by: Marena ChancyBECKNER, Tiaja Hagan S Pre-anesthesia Checklist: Emergency Drugs available, Patient identified, Suction available, Patient being monitored and Timeout performed Patient Re-evaluated:Patient Re-evaluated prior to inductionOxygen Delivery Method: Circle system utilized Preoxygenation: Pre-oxygenation with 100% oxygen Intubation Type: IV induction Ventilation: Mask ventilation without difficulty LMA: LMA inserted LMA Size: 5.0 Number of attempts: 1 Placement Confirmation: positive ETCO2 and breath sounds checked- equal and bilateral Tube secured with: Tape Dental Injury: Teeth and Oropharynx as per pre-operative assessment

## 2015-05-15 NOTE — Op Note (Signed)
I assisted Surgeon(s) and Role:    * Betha LoaKevin Maryruth Apple, MD - Primary    * Cindee SaltGary Alejandro Adcox, MD on the Procedure(s): RIGHT RING FINGER REPAIR FLEXOR DIGITORUM PROFUNDUS AVULSION AND DISTAL PHALANX FRACTURE on 05/15/2015.  I provided assistance on this case as follows: incision, retraction. Opening and debriding distal phalanx fracture, finding of tendon stump with bone fragment, preparing flexor tendon sheath, reducing the fractures with pull-out suture over a button, interpretation of x-ray findings, closure and splint application. I was present for the entire case.  Electronically signed by: Nicki ReaperKUZMA,Zamorah Ailes R, MD Date: 05/15/2015 Time: 7:15 PM

## 2015-05-15 NOTE — Brief Op Note (Signed)
05/15/2015  3:21 PM  PATIENT:  [name]  55 y.o. female  PRE-OPERATIVE DIAGNOSIS:  right ring finger flexor digitorum profundus avulsion with distal phalanx fracture  POST-OPERATIVE DIAGNOSIS:  right ring finger flexor digitorum profundus avulsion with distal phalanx intraarticular  fracture  PROCEDURE:  Procedure(s): RIGHT RING FINGER REPAIR FLEXOR DIGITORUM PROFUNDUS AVULSION AND DISTAL PHALANX FRACTURE (Right)  SURGEON:  Surgeon(s) and Role:    * Allison LoaKevin Mirai Greenwood, MD - Primary    * Allison SaltGary Deadrian Toya, MD  PHYSICIAN ASSISTANT:   ASSISTANTS: Allison SaltGary Berdell Nevitt, MD   ANESTHESIA:   general  EBL:  Total I/O In: 1000 [I.V.:1000] Out: 10 [Blood:10]  BLOOD ADMINISTERED:none  DRAINS: none   LOCAL MEDICATIONS USED:  MARCAINE     SPECIMEN:  No Specimen  DISPOSITION OF SPECIMEN:  N/A  COUNTS:  YES  TOURNIQUET:   Total Tourniquet Time Documented: Upper Arm (Right) - 58 minutes Total: Upper Arm (Right) - 58 minutes   DICTATION: .Other Dictation: Dictation Number [id number]  PLAN OF CARE: Discharge to home after PACU  PATIENT DISPOSITION:  PACU - hemodynamically stable.

## 2015-05-15 NOTE — Progress Notes (Signed)
[facility] notified for [facility]

## 2015-05-15 NOTE — Op Note (Signed)
Intra-operative fluoroscopic images in the AP, lateral, and oblique views were taken and evaluated by myself.  Reduction and hardware placement were confirmed.  There was no intraarticular penetration of permanent hardware.  

## 2015-05-15 NOTE — Op Note (Signed)
NAMMichiel Sites:  [name], [name]                 ACCOUNT NO.:  [id number]  MEDICAL RECORD NO.:  [id number]  LOCATION:  [facility]                         FACILITY:  [facility]  PHYSICIAN:  Betha LoaKevin Charline Hoskinson, MD        DATE OF BIRTH:  September 12, 1960  DATE OF PROCEDURE:  05/15/2015 DATE OF DISCHARGE:  05/15/2015                              OPERATIVE REPORT   PREOPERATIVE DIAGNOSIS:  Right ring finger flexor digitorum profundus avulsion with intra-articular distal phalanx fracture.  POSTOPERATIVE DIAGNOSIS:  Right ring finger flexor digitorum profundus avulsion with intra-articular distal phalanx fracture.  PROCEDURE:   1. Right ring finger repair of FDP tendon to distal phalanx 2. Open reduction and fixation of right ring finger distal phalanx intra-articular fracture.  SURGEON:  Betha LoaKevin Tenesha Garza, MD.  ASSISTANT:  Cindee SaltGary Jyoti Harju, MD.  ANESTHESIA:  General.  IV FLUIDS:  Per anesthesia flow sheet.  ESTIMATED BLOOD LOSS:  Minimal.  COMPLICATIONS:  None.  SPECIMENS:  None.  TIME OF TOURNIQUET:  58 minutes.  DISPOSITION:  Stable to PACU.  INDICATIONS:  [name] is a 55 year old female, who fell in his car injuring his right ring finger.  He was admitted for other issues. Radiographs were taken revealing a fracture of the distal phalanx of the finger.  He was unable to flex it at the DIP joint.  He followed up in the office, I recommended operative fixation and repair of the tendon. Risks, benefits, and alternatives of surgery were discussed including risk of blood loss; infection; damage to nerves, vessels, tendons, ligaments, bone; failure of surgery; need for additional surgery; complications with wound healing; continued pain; nonunion; malunion; stiffness; and loss of range of motion.  He voiced understanding of these risks and elected to proceed.  OPERATIVE COURSE:  After being identified preoperatively by myself, the patient and I agreed upon procedure and site of procedure.  Surgical site was marked.  The  risks, benefits, and alternatives of surgery were reviewed and he wished to proceed.  Surgical consent had been signed. He was given IV Ancef as preoperative antibiotic prophylaxis.  He was transferred to the operating room and placed on the operating room table in supine position with the right upper extremity on arm board.  General anesthesia was induced by Anesthesiology.  Right upper extremity was prepped and draped in normal sterile orthopedic fashion.  Surgical pause was performed between surgeons, anesthesia, operating staff, and all were in agreement to the patient, procedure, and site of procedure. Tourniquet at the proximal aspect of the extremity was inflated to 250 mmHg after exsanguination of the limb with Esmarch bandage.  A Brunner- type incision was made over the right ring finger.  This was carried into subcutaneous tissues by spreading technique.  Bipolar electrocautery was used to obtain hemostasis.  The fracture of the distal phalanx was identified.  It was intra-articular.  The fractured portion of bone was soft.  The remainder of the distal phalanx was stable to stress into the radial and ulnar collateral ligament and did not dislocate dorsally.  The flexor tendon stump was found around the level of the PIP joint.  The sheath was cleared of early pseudo tendon. A 2-0 Prolene  suture was weaved through the tendon.  This was able to be used to advance the tendon back through the sheath with the exception of the A5 pulley which was released.  The tendon was able to be brought back down to the distal phalanx.  There was a small bone fragment attached to the FDP tendon as well.  The needles of the Prolene suture were able to be passed through the fragment of the distal phalanx and then out through the nail.  They were tied over a makeshift button as the button company is no longer producing the buttons.  The suture was tied.  This brought the FDP tendon back down to the  distal phalanx and reduced the fracture fragment acceptably.  The joint was not subluxated. C-arm was used in AP and lateral projections to ensure appropriate reduction which was the case.  The wound was copiously irrigated with sterile saline.  It was closed with 4-0 nylon in a horizontal mattress fashion.  6 mL of 0.25% plain Marcaine was used to aid in postoperative analgesia as a digital block.  The wound was dressed with sterile Xeroform, 4x4s, and wrapped with a Kerlix bandage.  A dorsal blocking splint was placed and wrapped with Kerlix and Ace bandage.  Tourniquet deflated at 58 minutes.  Fingertips were pink with brisk capillary refill after deflation of tourniquet.  Operative drapes were broken down.  The patient was awoken from anesthesia safely.  He was transferred back to stretcher and taken to PACU in stable condition.  I will see him back in the office in 1 week for postoperative followup.  I will give him oxycodone 5 mg 1-2 p.o. q.6 hours p.r.n. pain, dispensed #40.  He will take this in addition to his regular medications for post surgical pain.     Betha Loa, MD     KK/MEDQ  D:  05/15/2015  T:  05/15/2015  Job:  [id number]

## 2015-05-15 NOTE — Anesthesia Preprocedure Evaluation (Signed)
Anesthesia Evaluation  Patient identified by MRN, date of birth, ID band Patient awake    Reviewed: Allergy & Precautions, NPO status , Patient's Chart, lab work & pertinent test results  Airway Mallampati: II   Neck ROM: full    Dental   Pulmonary sleep apnea ,    breath sounds clear to auscultation       Cardiovascular hypertension,  Rhythm:regular Rate:Normal     Neuro/Psych Anxiety Depression    GI/Hepatic   Endo/Other  diabetes, Type 2  Renal/GU      Musculoskeletal   Abdominal   Peds  Hematology   Anesthesia Other Findings   Reproductive/Obstetrics                             Anesthesia Physical Anesthesia Plan  ASA: III  Anesthesia Plan: General   Post-op Pain Management:    Induction: Intravenous  Airway Management Planned: LMA  Additional Equipment:   Intra-op Plan:   Post-operative Plan:   Informed Consent: I have reviewed the patients History and Physical, chart, labs and discussed the procedure including the risks, benefits and alternatives for the proposed anesthesia with the patient or authorized representative who has indicated his/her understanding and acceptance.     Plan Discussed with: CRNA, Anesthesiologist and Surgeon  Anesthesia Plan Comments:         Anesthesia Quick Evaluation

## 2015-05-17 ENCOUNTER — Other Ambulatory Visit: Payer: Self-pay

## 2015-05-17 MED ORDER — OXYCODONE-ACETAMINOPHEN 5-325 MG PO TABS: 1.0000 | ORAL_TABLET | Freq: Four times a day (QID) | ORAL | Status: DC | PRN

## 2015-05-17 NOTE — Telephone Encounter (Signed)
[facility]  [address] [place] [address]  Phone: [phone]  Fax: [phone]  

## 2015-05-21 ENCOUNTER — Encounter: Payer: Self-pay | Admitting: Internal Medicine

## 2015-05-21 ENCOUNTER — Non-Acute Institutional Stay (SKILLED_NURSING_FACILITY): Payer: 59 | Admitting: Internal Medicine

## 2015-05-21 DIAGNOSIS — S62634S Displaced fracture of distal phalanx of right ring finger, sequela: Secondary | ICD-10-CM

## 2015-05-21 DIAGNOSIS — R600 Localized edema: Secondary | ICD-10-CM

## 2015-05-21 DIAGNOSIS — R5381 Other malaise: Secondary | ICD-10-CM

## 2015-05-21 DIAGNOSIS — R195 Other fecal abnormalities: Secondary | ICD-10-CM | POA: Diagnosis not present

## 2015-05-21 DIAGNOSIS — Z029 Encounter for administrative examinations, unspecified: Secondary | ICD-10-CM

## 2015-05-21 DIAGNOSIS — R748 Abnormal levels of other serum enzymes: Secondary | ICD-10-CM | POA: Diagnosis not present

## 2015-05-21 NOTE — Progress Notes (Signed)
Patient ID: [name], female   DOB: 06-18-1960, 55 y.o.   MRN: [id number]     LOCATION: [facility]  PCP: Pcp Not In System   Code Status: Full Code   Goals of care: Advanced Directive information Advanced Directives 05/14/2015  Does patient have an advance directive? No  Would patient like information on creating an advanced directive? No - patient declined information     Extended Emergency Contact Information Primary Emergency Contact: Menge,Julie Address: [address]          [place], [place] [address] [place] Mobile Phone: [phone] Relation: Sister Secondary Emergency Contact: Alcide Evener [place] Mobile Phone: [phone] Relation: Sister   No Known Allergies  Chief Complaint  Patient presents with  . Acute Visit    leg edema, loose stool, form to be filled     HPI:  Patient is a 55 y.o. female seen today for acute concerns. He finds the swelling in his leg to be increasing. He complaints of occasional shortness of breath. He has been having 5-6 bowel movement a day, mainly straight after meals. Some of these are loose stool. Denies abdominal cramps. He also needs a short term disability form filled out. He would like his torsemide resumed. On chart review, he is on torsemide 40 mg daily. He was taking torsemide 30 mg bid at home. Pain medication has been helpful. He had right ring finger flexor profundus avulsion with distal phalanx fracture and underwent surgical repair on 05/15/15.   Review of Systems:  Constitutional: Negative for fever, chills.  HENT: Negative for headache, congestion, difficulty swallowing.  Eyes: Negative for blurred vision, double vision and discharge.  Respiratory: Negative for cough, wheezing. Has orthopnea. Cardiovascular: Negative for chest pain, palpitations. Positive for leg swelling.  Gastrointestinal: Negative for heartburn, nausea, vomiting, abdominal pain. Genitourinary: Negative for  dysuria and flank pain.  Musculoskeletal: Negative for back pain, fall.  Skin: Negative for itching, rash.  Neurological: Negative for weakness and dizziness. Psychiatric/Behavioral: Negative for depression.   Past Medical History  Diagnosis Date  . Chicken pox   . Depression   . Venous stasis   . HTN (hypertension)     History of; resolved following bariatric surgery  . History of sleep apnea   . History of malnutrition   . History of vitamin A deficiency   . Sleep apnea     no CPAP after losing weight  . DM type 2 (diabetes mellitus, type 2) (HCC) 2006    Essentially resolved following bariatric surgery, no meds  . Anxiety   . Chronic pain   . Anemia     iron deficient   Past Surgical History  Procedure Laterality Date  . Cholecystectomy  1991  . Appendectomy  1991  . Tonsillectomy and adenoidectomy  1967  . Severe malnutrition  2014  . Below knee leg amputation Right 2014  . Hernia repair  2009  . Gastric sleeve, biliopancreatic diversion, duodenal switch  2006    Medications:   Medication List       This list is accurate as of: 05/21/15  2:15 PM.  Always use your most recent med list.               acetaminophen 325 MG tablet  Commonly known as:  TYLENOL  Take 2 tablets (650 mg total) by mouth every 6 (six) hours as needed for mild pain (or Fever >/= 101).     calcium carbonate 1500 (600  Ca) MG Tabs tablet  Commonly known as:  OSCAL  Take 600 mg of elemental calcium by mouth 2 (two) times daily with a meal.     citalopram 10 MG tablet  Commonly known as:  CELEXA  Take 10 mg by mouth daily. For depression     DECUBI-VITE PO  Take 1 tablet by mouth daily.     docusate sodium 100 MG capsule  Commonly known as:  COLACE  Take 100 mg by mouth daily. For constipation     DULoxetine 20 MG capsule  Commonly known as:  CYMBALTA  Take 20 mg by mouth daily.     ergocalciferol 50000 units capsule  Commonly known as:  VITAMIN D2  Take 50,000 Units by  mouth once a week.     feeding supplement (PRO-STAT SUGAR FREE 64) Liqd  Take 30 mLs by mouth 2 (two) times daily with a meal. Stop date 06/04/15     ferrous sulfate 325 (65 FE) MG tablet  Take 325 mg by mouth 2 (two) times daily.     folic acid 1 MG tablet  Commonly known as:  FOLVITE  Take 1 mg by mouth daily.     levothyroxine 25 MCG tablet  Commonly known as:  SYNTHROID, LEVOTHROID  Take 25 mcg by mouth daily before breakfast. For hypothyroidism     levothyroxine 200 MCG tablet  Commonly known as:  SYNTHROID, LEVOTHROID  TAKE ONE TABLET BY MOUTH DAILY     Melatonin 5 MG Tabs  Take 5 mg by mouth at bedtime. For insomnia     mirtazapine 7.5 MG tablet  Commonly known as:  REMERON  Take 7.5 mg by mouth at bedtime. For insomnia. Take (1/2 tablet)     morphine 15 MG 12 hr tablet  Commonly known as:  MS CONTIN  Take 15 mg by mouth every 12 (twelve) hours. For pain     oxyCODONE-acetaminophen 5-325 MG tablet  Commonly known as:  PERCOCET/ROXICET  Take 1 tablet by mouth every 4 (four) hours as needed for moderate pain or severe pain (Take 2 tablets every 4 hours as needed for severe pain).     pantoprazole 40 MG tablet  Commonly known as:  PROTONIX  Take 40 mg by mouth daily. For GERD     Pramoxine-Calamine 1-3 % Crea  Commonly known as:  AVEENO ANTI-ITCH  Apply 1 Bottle topically every 8 (eight) hours.     vitamin C 500 MG tablet  Commonly known as:  ASCORBIC ACID  Take 500 mg by mouth daily. FOR 30 DAYS   STOP DATE 06/04/15        Immunizations: Immunization History  Administered Date(s) Administered  . PPD Test 05/01/2015, 05/15/2015     Physical Exam: Filed Vitals:   05/21/15 1126  BP: 128/73  Pulse: 72  Temp: 98.7 F (37.1 C)  TempSrc: Oral  Resp: 18  Height: 6' (1.829 m)  Weight: 220 lb 3.2 oz (99.882 kg)  SpO2: 98%   Body mass index is 29.86 kg/(m^2).  Wt Readings from Last 3 Encounters:  05/21/15 220 lb 3.2 oz (99.882 kg)  05/15/15 218 lb  (98.884 kg)  05/14/15 218 lb 8 oz (99.111 kg)    General- adult female, overweight, in no acute distress Head- normocephalic, atraumatic Nose- no maxillary or frontal sinus tenderness, no nasal discharge Eyes- PERRLA, EOMI, no pallor, no icterus Neck- no cervical lymphadenopathy Cardiovascular- normal s1,s2, no murmur Respiratory- bilateral clear to auscultation, no wheeze, no rhonchi, no crackles,  no use of accessory muscles Abdomen- bowel sounds present, soft, non tender Musculoskeletal- right BKA, able to move all other extremities, generalized weakness, + edema to both leg Neurological- alert and oriented to person, place and time Skin- warm and dry, ace wrap dressing to right hand Psychiatry- normal mood and affect    Labs reviewed: Basic Metabolic Panel:  Recent Labs  16/11/9601/04/17 0750 04/29/15 0620  05/01/15 0615 05/07/15 05/15/15 1111  NA 139 140  < > 139 138 140  K 3.2* 3.9  --  4.4 4.5 4.1  CL 103 106  --  107  --  105  CO2 27 27  --  25  --  28  GLUCOSE 133* 102*  --  100*  --  90  BUN 13 12  < > 10 8 9   CREATININE 0.82 0.79  < > 0.79 0.6 0.79  CALCIUM 7.3* 7.3*  --  7.4*  --  7.9*  MG 1.6*  --   --   --   --   --   PHOS 2.2*  --   --   --   --   --   < > = values in this interval not displayed. Liver Function Tests:  Recent Labs  01/17/15 0914 04/28/15 0750 05/07/15 05/15/15 1111  AST 21 20 24  33  ALT 22 15* 14 21  ALKPHOS 286* 213* 372* 382*  BILITOT 0.5 0.7  --  0.6  PROT 6.5 5.6*  --  6.4*  ALBUMIN 3.2*  3.2* 2.2*  --  2.3*   No results for input(s): LIPASE, AMYLASE in the last 8760 hours. No results for input(s): AMMONIA in the last 8760 hours. CBC:  Recent Labs  04/28/15 0750  05/01/15 0615 05/07/15 05/15/15 1111  WBC 6.9  < > 5.8 6.0 7.3  NEUTROABS 5.2  --   --   --   --   HGB 8.5*  < > 8.7* 9.4* 10.7*  HCT 28.1*  < > 28.1* 32* 35.6*  MCV 93.0  --  95.3  --  94.2  PLT 308  --  283 312 334  < > = values in this interval not  displayed. Cardiac Enzymes:  Recent Labs  04/28/15 1426  CKTOTAL 87   BNP: Invalid input(s): POCBNP CBG:  Recent Labs  04/27/15 1656 05/15/15 1110 05/15/15 1544  GLUCAP 101* 77 86    Radiological Exams: Kore[place] Scrotum  04/28/2015  CLINICAL DATA:  Subacute onset of scrotal swelling, status post traumatic injury. Initial encounter. EXAM: SCROTAL ULTRASOUND DOPPLER ULTRASOUND OF THE TESTICLES TECHNIQUE: Complete ultrasound examination of the testicles, epididymis, and other scrotal structures was performed. Color and spectral Doppler ultrasound were also utilized to evaluate blood flow to the testicles. COMPARISON:  CT of the abdomen and pelvis performed earlier today at 1:06 a.m. FINDINGS: Right testicle Measurements: 2.5 x 1.5 x 2.0 cm. No mass or microlithiasis visualized. Left testicle Measurements: 2.3 x 1.6 x 1.8 cm. No mass or microlithiasis visualized. Right epididymis:  Not visualized. Left epididymis:  Not visualized. Hydrocele:  None visualized. Varicocele:  None visualized. Pulsed Doppler interrogation of both testes demonstrates normal low resistance arterial and venous waveforms bilaterally. Vague soft tissue edema is noted at the scrotum. There is also extensive fat about the scrotal wall. IMPRESSION: 1. No evidence of testicular torsion. 2. Mild vague soft tissue edema noted at the scrotum. Electronically Signed   By: Roanna RaiderJeffery  Chang M.D.   On: 04/28/2015 07:14   Ct Abdomen Pelvis W  Contrast  04/28/2015  CLINICAL DATA:  Status post fall while walking 1 week ago, with bilateral abdominal pain and scrotal swelling. Initial encounter. EXAM: CT ABDOMEN AND PELVIS WITH CONTRAST TECHNIQUE: Multidetector CT imaging of the abdomen and pelvis was performed using the standard protocol following bolus administration of intravenous contrast. CONTRAST:  80 mL of Omnipaque 300 IV contrast COMPARISON:  Right upper quadrant ultrasound performed 12/05/2013 FINDINGS: Mild bibasilar atelectasis is noted.  Diffuse fatty infiltration is noted within the liver. The spleen is unremarkable in appearance. The patient is status post cholecystectomy, with clips noted along the gallbladder fossa. 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 The pancreas and adrenal glands are unremarkable. The kidneys are unremarkable in appearance. There is no evidence of hydronephrosis. No renal or ureteral stones are seen. Minimal nonspecific perinephric stranding is noted bilaterally. No free fluid is identified. The small bowel is unremarkable in appearance. The stomach is within normal limits. No acute vascular abnormalities are seen. Mild scattered calcification is noted along the abdominal aorta and its branches. Diffuse soft tissue inflammation and edema are noted about the abdominal and pelvic wall, raising concern for soft tissue injury. This extends about the soft tissues superior to the scrotum. Cellulitis could have such an appearance. The appendix is grossly unremarkable in appearance, without evidence of appendicitis. The colon is unremarkable in appearance. The bladder is mildly distended and grossly remarkable. The prostate remains normal in size. No inguinal lymphadenopathy is seen. No acute osseous abnormalities are identified. There is chronic compression deformity involving vertebral body T7. Diffuse osteopenia of visualized osseous structures is noted. IMPRESSION: 1. Diffuse soft tissue inflammation and edema about the abdominal and pelvic wall, raising concern for soft tissue injury given recent fall. This extends about the soft tissues superior to the scrotum. Cellulitis could have such an appearance. 2. Mild scattered calcification along the abdominal aorta and its branches. 3. Diffuse fatty infiltration within the liver. 4. Mild bibasilar atelectasis noted. 5. Chronic compression deformity of vertebral body T7, with diffuse osteopenia of visualized osseous  structures Electronically Signed   By: Roanna Raider M.D.   On: 04/28/2015 02:22   Ct Finger Right Wo Contrast  04/30/2015  CLINICAL DATA:  Volar plate avulsion injury of the right hand. EXAM: CT OF THE RIGHT FINGERS WITHOUT CONTRAST TECHNIQUE: Multidetector CT imaging was performed according to the standard protocol. Multiplanar CT image reconstructions were also generated. COMPARISON:  None. FINDINGS: There is a comminuted fracture of the volar base of the fourth distal phalanx. There is a sliver of bone along the volar base of the fourth middle phalanx without a donor site suggesting an avulsed fragment from the fourth distal phalanx with 2.4 cm of retraction of the flexor digitorum profundus tendon. There is no other fracture or dislocation. There is no lytic or sclerotic osseous lesion. There is no fluid collection or hematoma. IMPRESSION: Comminuted fracture of the volar base of the fourth distal phalanx. Sliver of bone along the volar base of the fourth middle phalanx without a donor site suggesting an avulsed fragment from the fourth distal phalanx with 2.4 cm of retraction of the flexor digitorum profundus tendon. Electronically Signed   By: Elige Ko   On: 04/30/2015 14:48   [place] Art/ven Flow Abd Pelv Doppler  04/28/2015  CLINICAL DATA:  Subacute onset of scrotal swelling, status post traumatic injury. Initial encounter. EXAM: SCROTAL ULTRASOUND DOPPLER ULTRASOUND OF THE TESTICLES TECHNIQUE: Complete ultrasound examination of the testicles,  epididymis, and other scrotal structures was performed. Color and spectral Doppler ultrasound were also utilized to evaluate blood flow to the testicles. COMPARISON:  CT of the abdomen and pelvis performed earlier today at 1:06 a.m. FINDINGS: Right testicle Measurements: 2.5 x 1.5 x 2.0 cm. No mass or microlithiasis visualized. Left testicle Measurements: 2.3 x 1.6 x 1.8 cm. No mass or microlithiasis visualized. Right epididymis:  Not visualized. Left epididymis:   Not visualized. Hydrocele:  None visualized. Varicocele:  None visualized. Pulsed Doppler interrogation of both testes demonstrates normal low resistance arterial and venous waveforms bilaterally. Vague soft tissue edema is noted at the scrotum. There is also extensive fat about the scrotal wall. IMPRESSION: 1. No evidence of testicular torsion. 2. Mild vague soft tissue edema noted at the scrotum. Electronically Signed   By: Roanna Raider M.D.   On: 04/28/2015 07:14   Dg Hand Complete Right  04/28/2015  CLINICAL DATA:  Fall 1 week ago. Right hand pain and decreased range of motion. Initial encounter. EXAM: RIGHT HAND - COMPLETE 3+ VIEW COMPARISON:  None. FINDINGS: Mildly displaced fractures are seen involving the proximal volar margins of the proximal and middle phalanges of the ring finger. No evidence of dislocation. No other fractures identified. IMPRESSION: Volar plate avulsion fractures involving the proximal and middle phalanges of the ring finger. Electronically Signed   By: Myles Rosenthal M.D.   On: 04/28/2015 14:32   Dg Hip Unilat With Pelvis 2-3 Views Left  04/28/2015  CLINICAL DATA:  Pain EXAM: DG HIP (WITH OR WITHOUT PELVIS) 2-3V LEFT COMPARISON:  None FINDINGS: Retained contrast in a distended bladder. Diffuse osseous demineralization. Hip joint spaces preserved. Scattered soft tissue and stool artifacts. No definite fracture or bone destruction. IMPRESSION: Osseous demineralization without definite acute bony abnormalities. Electronically Signed   By: Ulyses Southward M.D.   On: 04/28/2015 14:30    Assessment/Plan  Physical deconditioning Will have him work with physical therapy and occupational therapy team to help with gait training and muscle strengthening exercises.fall precautions. Skin care. Encourage to be out of bed.   Leg edema  His torsemide has not been administered for a week for unclear reason at the facility. He was getting 30 mg bid at home and 40 mg daily a week back at the  facility. Will start him on 30 mg bid with kclkcl supplement 20 meq daily for now. Check bmp on 05/23/15. Check tsh  Disability form Short term disability form has been filled out. He is deconditioned and has NWB to right hand until further follow up with hand surgeon. He needs assistance with his ADLs at present and will be undergoing training for strengthening exercises at the SNF.   Right ring finger fracture S/p ORIF. Continue morphine ER 15 mg bid, oxycodone-tylenol 5-325 mg 1-2 tab q4h prn   Deranged LFT Currently on tylenol. Monitor CMP  Loose stool  Discontinue colace for now. Hydration encouraged. Monitor clinically. To provide imodium as needed if loose stool continues and will need to be reassessed    Labs/tests ordered: cbc, cmp, tsh 05/23/15  Family/ staff Communication: reviewed care plan with patient and nursing supervisor    Oneal Grout, MD Internal Medicine [facility] [address] [place], [place] [address] Cell Phone (Monday-Friday 8 am - 5 pm): [phone] On Call: [phone] and follow prompts after 5 pm and on weekends Office Phone: [phone] Office Fax: [phone]

## 2015-05-23 LAB — BASIC METABOLIC PANEL
BUN: 15 mg/dL (ref 4–21)
CREATININE: 0.8 mg/dL (ref 0.6–1.3)
Glucose: 60 mg/dL
Potassium: 4.7 mmol/L (ref 3.4–5.3)
Sodium: 141 mmol/L (ref 137–147)

## 2015-05-23 LAB — HEPATIC FUNCTION PANEL
ALT: 19 U/L (ref 10–40)
AST: 28 U/L (ref 14–40)
Alkaline Phosphatase: 4.13 U/L — AB (ref 25–125)
Bilirubin, Total: 0.4 mg/dL

## 2015-05-23 LAB — TSH: TSH: 4.86 u[IU]/mL (ref 0.41–5.90)

## 2015-05-23 LAB — CBC AND DIFFERENTIAL
HCT: 32 % — AB (ref 41–53)
Hemoglobin: 9.8 g/dL — AB (ref 13.5–17.5)
PLATELETS: 318 10*3/uL (ref 150–399)
WBC: 7.3 10*3/mL

## 2015-05-23 NOTE — Anesthesia Postprocedure Evaluation (Signed)
Anesthesia Post Note  Patient: [name]  Procedure(s) Performed: Procedure(s) (LRB): RIGHT RING FINGER REPAIR FLEXOR DIGITORUM PROFUNDUS AVULSION AND DISTAL PHALANX FRACTURE (Right)  Patient location during evaluation: PACU Anesthesia Type: General Level of consciousness: awake and alert and patient cooperative Pain management: pain level controlled Vital Signs Assessment: post-procedure vital signs reviewed and stable Respiratory status: spontaneous breathing and respiratory function stable Cardiovascular status: stable Anesthetic complications: no    Last Vitals:  Filed Vitals:   05/15/15 1619 05/15/15 1630  BP: 105/52   Pulse:  68  Temp:  36.1 C  Resp:  14    Last Pain:  Filed Vitals:   05/15/15 1650  PainSc: 0-No pain                 Zackory Pudlo S

## 2015-06-01 ENCOUNTER — Non-Acute Institutional Stay (SKILLED_NURSING_FACILITY): Payer: 59 | Admitting: Family

## 2015-06-01 ENCOUNTER — Encounter: Payer: Self-pay | Admitting: Family

## 2015-06-01 DIAGNOSIS — G894 Chronic pain syndrome: Secondary | ICD-10-CM | POA: Diagnosis not present

## 2015-06-01 DIAGNOSIS — E039 Hypothyroidism, unspecified: Secondary | ICD-10-CM | POA: Diagnosis not present

## 2015-06-01 DIAGNOSIS — D509 Iron deficiency anemia, unspecified: Secondary | ICD-10-CM | POA: Diagnosis not present

## 2015-06-01 DIAGNOSIS — S62609D Fracture of unspecified phalanx of unspecified finger, subsequent encounter for fracture with routine healing: Secondary | ICD-10-CM | POA: Diagnosis not present

## 2015-06-01 DIAGNOSIS — I10 Essential (primary) hypertension: Secondary | ICD-10-CM | POA: Diagnosis not present

## 2015-06-01 DIAGNOSIS — E559 Vitamin D deficiency, unspecified: Secondary | ICD-10-CM | POA: Diagnosis not present

## 2015-06-01 NOTE — Progress Notes (Signed)
Location:  [facility] Room Number: 509 B Place of Service:  SNF (31)  Provider: Richarda Blade, FNP-C  PCP: Pcp Not In System Patient Care Team: Pcp Not In System as PCP - General  Extended Emergency Contact Information Primary Emergency Contact: Menge,Julie Address: [address]          [place], [place] [address] [place] Phone: [phone] Relation: Sister Secondary Emergency Contact: Alcide Evener [place] Mobile Phone: [phone] Relation: Sister  Code Status: Full Code  Goals of care:  Advanced Directive information Advanced Directives 05/14/2015  Does patient have an advance directive? No  Would patient like information on creating an advanced directive? No - patient declined information     No Known Allergies  Chief Complaint  Patient presents with  . Discharge Note    Discharge from SNF    HPI:  55 y.o. female seen today at [facility] for discharge home. He was here for short term rehabilitation post hospital admission from 04/27/15-05/01/15 with scrotal cellulitis and was treated with antibiotics.He is also S/p Right ring finger fracture S/p ORIF on 05/23/2015.Current on morphine ER 15 mg bid, oxycodone-tylenol 5-325 mg 1-2 tab q4h prn for right finger pain and chronic pain.He has PMH of HTN, DM, hypothyroidism, stasis ulcer, chronic pain syndrome among others. He is NWB to right hand  for 4-8 weeks until further follow up with hand surgeon last seen 05/30/2015. Pain under controlled with current regimen. He has worked well with PT/OT now stable for discharge home to continue with PT/OT for ROM, exercise, gait stability and muscle strengthening. He will also require HH Aid for assistance with ADL. He does not require any DME has own power wheelchair. Home health service will be arranged by facility social worker prior to discharge. He will be discharge home with  medications.    Past Medical History  Diagnosis Date  . Chicken pox   . Depression   . Venous stasis   . HTN (hypertension)     History of; resolved following bariatric surgery  . History of sleep apnea   . History of malnutrition   . History of vitamin A deficiency   . Sleep apnea     no CPAP after losing weight  . DM type 2 (diabetes mellitus, type 2) (HCC) 2006    Essentially resolved following bariatric surgery, no meds  . Anxiety   . Chronic pain   . Anemia     iron deficient    Past Surgical History  Procedure Laterality Date  . Cholecystectomy  1991  . Appendectomy  1991  . Tonsillectomy and adenoidectomy  1967  . Severe malnutrition  2014  . Below knee leg amputation Right 2014  . Hernia repair  2009  . Gastric sleeve, biliopancreatic diversion, duodenal switch  2006      reports that he has never smoked. He has never used smokeless tobacco. He reports that he does not drink alcohol or use illicit drugs. Social History   Social History  . Marital Status: Single    Spouse Name: N/A  . Number of Children: N/A  . Years of Education: N/A   Occupational History  . Not on file.   Social History Main Topics  . Smoking status: Never Smoker   . Smokeless tobacco: Never Used  . Alcohol Use: No  . Drug Use: No  . Sexual Activity: Not on file   Other Topics  Concern  . Not on file   Social History Narrative   Functional Status Survey:    No Known Allergies  Pertinent  Health Maintenance Due  Topic Date Due  . FOOT EXAM  11/10/1971  . OPHTHALMOLOGY EXAM  11/10/1971  . URINE MICROALBUMIN  11/10/1971  . INFLUENZA VACCINE  01/17/2016 (Originally 09/25/2015)  . HEMOGLOBIN A1C  10/29/2015  . COLONOSCOPY  01/17/2023    Medications:   Medication List       This list is accurate as of: 06/01/15  9:48 AM.  Always use your most recent med list.               acetaminophen 325 MG tablet  Commonly known as:  TYLENOL  Take 2 tablets (650 mg total) by  mouth every 6 (six) hours as needed for mild pain (or Fever >/= 101).     calcium carbonate 1500 (600 Ca) MG Tabs tablet  Commonly known as:  OSCAL  Take 600 mg of elemental calcium by mouth 2 (two) times daily with a meal.     citalopram 10 MG tablet  Commonly known as:  CELEXA  Take 10 mg by mouth daily. For depression     DECUBI-VITE PO  Take 1 tablet by mouth daily.     docusate sodium 100 MG capsule  Commonly known as:  COLACE  Take 100 mg by mouth daily. For constipation     DULoxetine 20 MG capsule  Commonly known as:  CYMBALTA  Take 20 mg by mouth daily.     ergocalciferol 50000 units capsule  Commonly known as:  VITAMIN D2  Take 50,000 Units by mouth once a week.     feeding supplement (PRO-STAT SUGAR FREE 64) Liqd  Take 30 mLs by mouth 2 (two) times daily with a meal. Stop date 06/04/15     ferrous sulfate 325 (65 FE) MG tablet  Take 325 mg by mouth 2 (two) times daily.     folic acid 1 MG tablet  Commonly known as:  FOLVITE  Take 1 mg by mouth daily.     levothyroxine 25 MCG tablet  Commonly known as:  SYNTHROID, LEVOTHROID  Take 25 mcg by mouth daily before breakfast. For hypothyroidism     levothyroxine 200 MCG tablet  Commonly known as:  SYNTHROID, LEVOTHROID  TAKE ONE TABLET BY MOUTH DAILY     Melatonin 5 MG Tabs  Take 5 mg by mouth at bedtime. For insomnia     mirtazapine 7.5 MG tablet  Commonly known as:  REMERON  Take 7.5 mg by mouth at bedtime. For insomnia. Take (1/2 tablet)     morphine 15 MG 12 hr tablet  Commonly known as:  MS CONTIN  Take 15 mg by mouth every 12 (twelve) hours. For pain     oxyCODONE-acetaminophen 5-325 MG tablet  Commonly known as:  PERCOCET/ROXICET  Take 1 tablet by mouth every 4 (four) hours as needed for moderate pain or severe pain (Take 2 tablets every 4 hours as needed for severe pain).     pantoprazole 40 MG tablet  Commonly known as:  PROTONIX  Take 40 mg by mouth daily. For GERD     potassium chloride SA  20 MEQ tablet  Commonly known as:  K-DUR,KLOR-CON  Take 20 mEq by mouth daily.     Pramoxine-Calamine 1-3 % Crea  Commonly known as:  AVEENO ANTI-ITCH  Apply 1 Bottle topically every 8 (eight) hours.     TORSEMIDE PO  Take 30 mg by mouth 2 (two) times daily. Hold for sbp < 110     vitamin C 500 MG tablet  Commonly known as:  ASCORBIC ACID  Take 500 mg by mouth daily. FOR 30 DAYS   STOP DATE 06/04/15        Review of Systems  Constitutional: Negative for fever, chills, activity change, appetite change and fatigue.  HENT: Negative for congestion, sinus pressure, sneezing, sore throat, trouble swallowing and voice change.   Eyes: Negative for discharge, redness, itching and visual disturbance.  Respiratory: Negative for cough, chest tightness, shortness of breath, wheezing and stridor.   Cardiovascular: Negative for chest pain, palpitations and leg swelling.  Gastrointestinal: Negative for nausea, vomiting, abdominal pain, diarrhea, constipation and abdominal distention.  Genitourinary: Negative.   Musculoskeletal: Positive for gait problem.       Right BKA.   Skin: Negative for color change, pallor, rash and wound.  Psychiatric/Behavioral: Negative.     Filed Vitals:   06/01/15 0930  BP: 132/67  Pulse: 68  Temp: 98.9 F (37.2 C)  Resp: 24  Height: 6' (1.829 m)  Weight: 223 lb 9.6 oz (101.424 kg)  SpO2: 97%   Body mass index is 30.32 kg/(m^2). Physical Exam  Constitutional: He is oriented to person, place, and time. He appears well-developed and well-nourished. No distress.  HENT:  Head: Normocephalic.  Mouth/Throat: Oropharynx is clear and moist.  Eyes: Conjunctivae and EOM are normal. Pupils are equal, round, and reactive to light. Right eye exhibits no discharge. Left eye exhibits no discharge. No scleral icterus.  Neck: Normal range of motion. No JVD present.  Cardiovascular: Normal rate, regular rhythm, normal heart sounds and intact distal pulses.  Exam reveals no  gallop and no friction rub.   No murmur heard. Pulmonary/Chest: Effort normal and breath sounds normal. No respiratory distress. He has no wheezes. He has no rales.  Abdominal: Soft. Bowel sounds are normal. He exhibits no distension and no mass. There is no tenderness. There is no rebound and no guarding.  Musculoskeletal: He exhibits no edema or tenderness.  Right hand splint in place. Right BKA prosthetic off during visit.   Lymphadenopathy:    He has no cervical adenopathy.  Neurological: He is oriented to person, place, and time.  Skin: Skin is warm and dry. No rash noted. No erythema. No pallor.  Psychiatric: He has a normal mood and affect.    Labs reviewed: Basic Metabolic Panel:  Recent Labs  16/10/96 0750 04/29/15 0620  05/01/15 0615 05/07/15 05/15/15 1111 05/23/15  NA 139 140  < > 139 138  138 140 141  K 3.2* 3.9  --  4.4 4.5  4.5 4.1 4.7  CL 103 106  --  107  --  105  --   CO2 27 27  --  25  --  28  --   GLUCOSE 133* 102*  --  100*  --  90  --   BUN 13 12  < > 10 8  8 9 15   CREATININE 0.82 0.79  < > 0.79 0.6  0.6 0.79 0.8  CALCIUM 7.3* 7.3*  --  7.4*  --  7.9*  --   MG 1.6*  --   --   --   --   --   --   PHOS 2.2*  --   --   --   --   --   --   < > = values in this interval not  displayed. Liver Function Tests:  Recent Labs  01/17/15 0914 04/28/15 0750 05/07/15 05/15/15 1111 05/23/15  AST 33 28  ALT 22 15* ALKPHOS 286* 213* 372* 382* 4.13*  BILITOT 0.5 0.7  --  0.6  --   PROT 6.5 5.6*  --  6.4*  --   ALBUMIN 3.2*  3.2* 2.2*  --  2.3*  --    No results for input(s): LIPASE, AMYLASE in the last 8760 hours. No results for input(s): AMMONIA in the last 8760 hours. CBC:  Recent Labs  04/28/15 0750  05/01/15 0615 05/07/15 05/15/15 1111 05/23/15  WBC 6.9  < > 5.8 6.0  6.0 7.3 7.3  NEUTROABS 5.2  --   --   --   --   --   HGB 8.5*  < > 8.7* 9.4*  9.4* 10.7* 9.8*  HCT 28.1*  < > 28.1* 32*  32* 35.6* 32*  MCV 93.0  --  95.3  --   94.2  --   PLT 308  --  283 312  312 334 318  < > = values in this interval not displayed. Cardiac Enzymes:  Recent Labs  04/28/15 1426  CKTOTAL 87   BNP: Invalid input(s): POCBNP CBG:  Recent Labs  04/27/15 1656 05/15/15 1110 05/15/15 1544  GLUCAP 101* 77 86    Procedures and Imaging Studies During Stay: No results found.  Assessment/Plan:   Finger fracture, right with routine healing S/p Right ring finger fracture post ORIF on 05/23/2015.Continue on morphine ER 15 mg bid, oxycodone-tylenol 5-325 mg 1-2 tab q4h prn for right finger pain and chronic pain. NWB to right hand  for 4-8 weeks until further follow up with hand surgeon last seen 05/30/2015.  Hypertension B/p stable. Currently off medication. Monitor BMP  Hypothyroidism  Continue on Levothyroxine 225 mcg Tablet. PCP to monitor TSH   Chronic pain Syndrome  Chronic pain.Continue on morphine ER 15 mg bid, oxycodone-tylenol 5-325 mg 1-2 tab q4h prn. Wean off pain medication as tolerated.   Iron deficiency Anemia  Continue on Ferrous sulfate. PCP to monitor CBC  Vitamin D deficiency Continue on Vit D 50,000 units weekly on Monday. PCP to monitor vit D level.   Depression  Stable. Continue on Duloxetine 20 mg Capsule, Remeron 7.5 mg Tablet. Monitor for mood changes.   Patient is being discharged with the following home health services:    PT/OT for ROM, exercise, gait stability and muscle strengthening.   He will also require HH Aid for assistance with ADL.   Patient is being discharged with the following durable medical equipment:    He does not require any DME has own power wheelchair.  RX for oxycodone-tylenol 5-325 mg 1-2 tab q4h prn. Written for one month supply QTY 60   Patient has been advised to f/u with their PCP in 1-2 weeks to bring them up to date on their rehab stay.  Social services at facility was responsible for arranging this appointment.  Pt was provided with a 30 day supply of  prescriptions for medications and refills must be obtained from their PCP.  For controlled substances, a more limited supply may be provided adequate until PCP appointment only.  Future labs/tests needed:  CBC, BMP, TSH with PCP

## 2015-06-04 DIAGNOSIS — I872 Venous insufficiency (chronic) (peripheral): Secondary | ICD-10-CM | POA: Diagnosis not present

## 2015-06-04 DIAGNOSIS — R531 Weakness: Secondary | ICD-10-CM | POA: Diagnosis not present

## 2015-06-04 DIAGNOSIS — R269 Unspecified abnormalities of gait and mobility: Secondary | ICD-10-CM | POA: Diagnosis not present

## 2015-06-04 DIAGNOSIS — S62601D Fracture of unspecified phalanx of left index finger, subsequent encounter for fracture with routine healing: Secondary | ICD-10-CM | POA: Diagnosis not present

## 2015-06-04 DIAGNOSIS — L97229 Non-pressure chronic ulcer of left calf with unspecified severity: Secondary | ICD-10-CM | POA: Diagnosis not present

## 2015-10-16 IMAGING — CR DG CHEST 1V PORT
1 series · 1 of 1 positions shown · non-contrast
Comparison: 12/02/2013

CLINICAL DATA: Fever today.

EXAM:
PORTABLE CHEST - 1 VIEW

[ap]
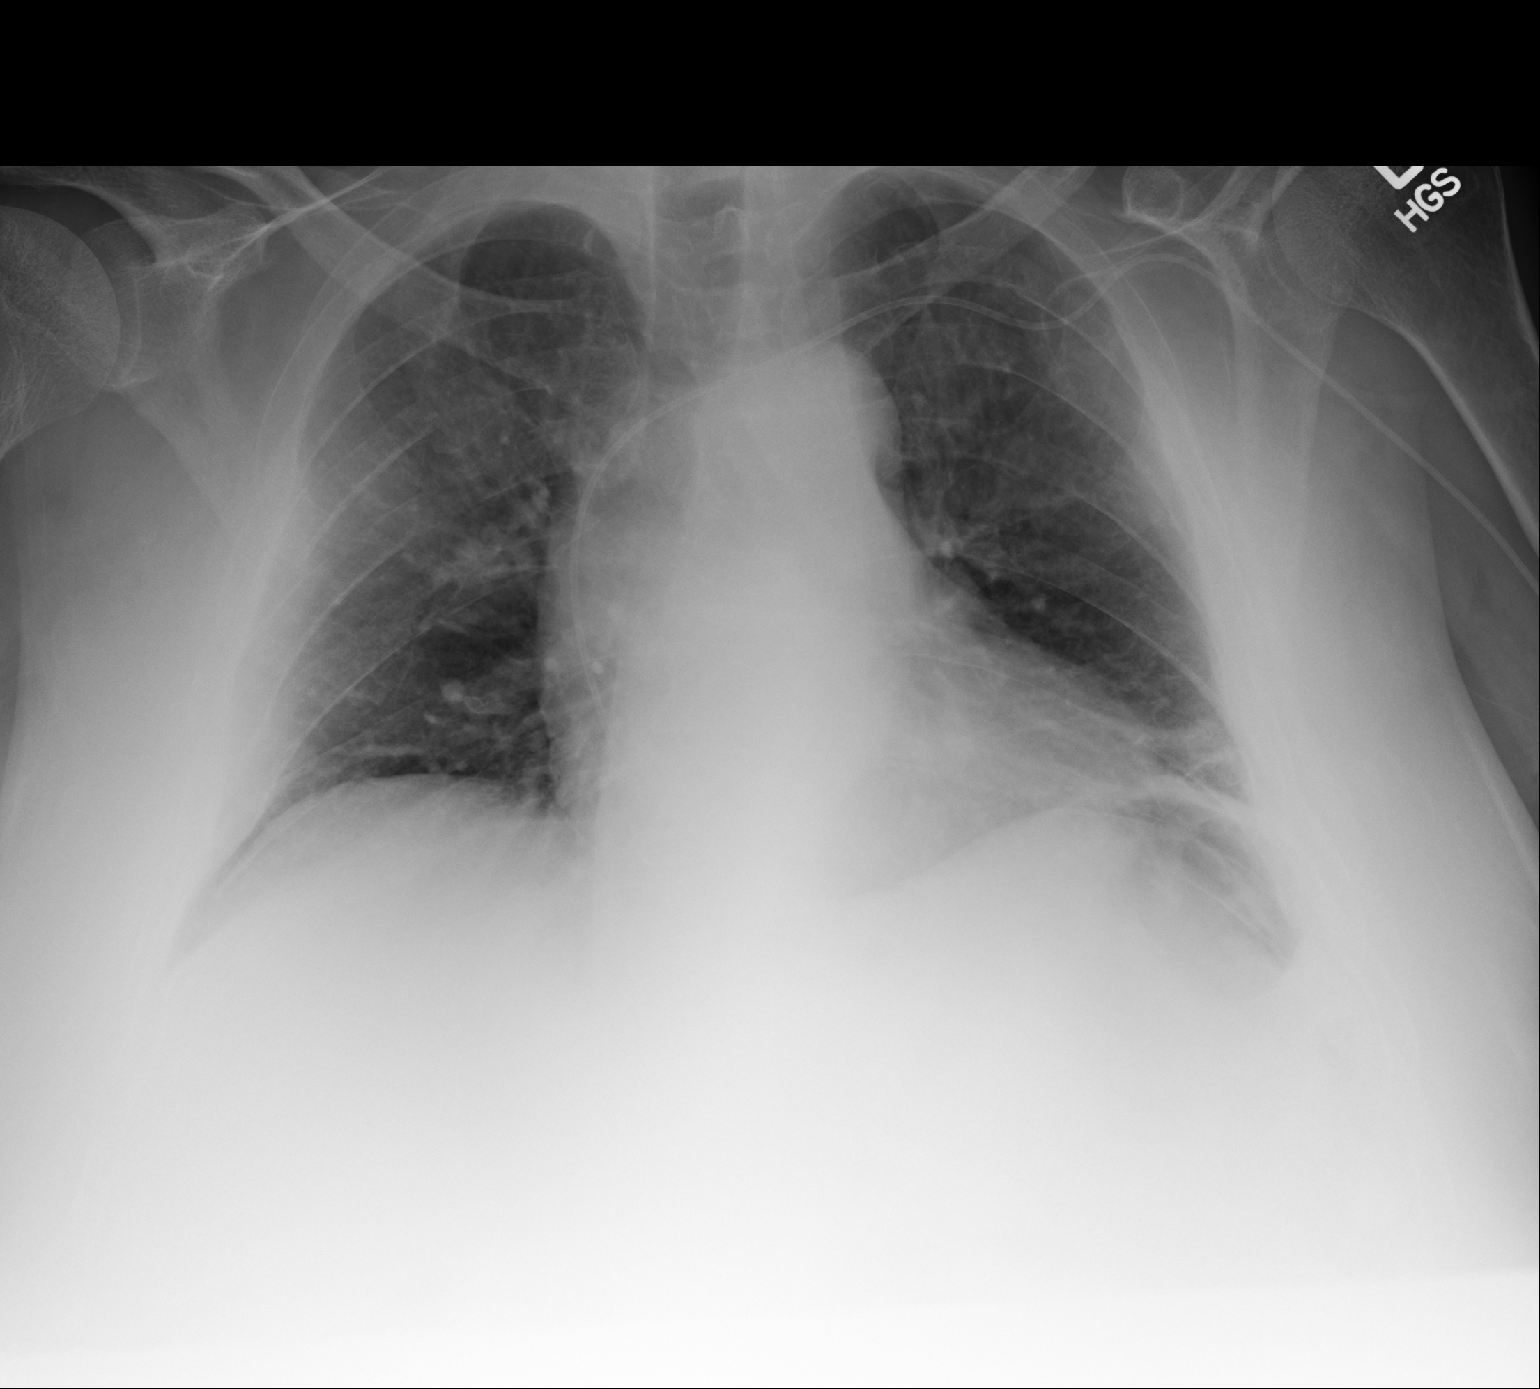

[1 of 1 positions shown; findings below may reference images not displayed]

FINDINGS: A left-sided PICC line is noted. The tip is near the cavoatrial
junction. The heart is borderline enlarged but stable. There is mild
tortuosity of the thoracic aorta. Low lung volumes with vascular
crowding and atelectasis. Chronic left basilar scarring changes. No
pleural effusion or pulmonary edema.
IMPRESSION: Stable mild cardiac enlargement and torturous thoracic aorta.

Low lung volumes with vascular crowding and atelectasis. Chronic
left basilar scarring.

## 2015-10-20 IMAGING — CR DG CHEST 1V PORT
1 series · 1 of 1 positions shown · non-contrast
Comparison: 04/03/2014

CLINICAL DATA: PICC placement

EXAM:
PORTABLE CHEST - 1 VIEW

[ap]
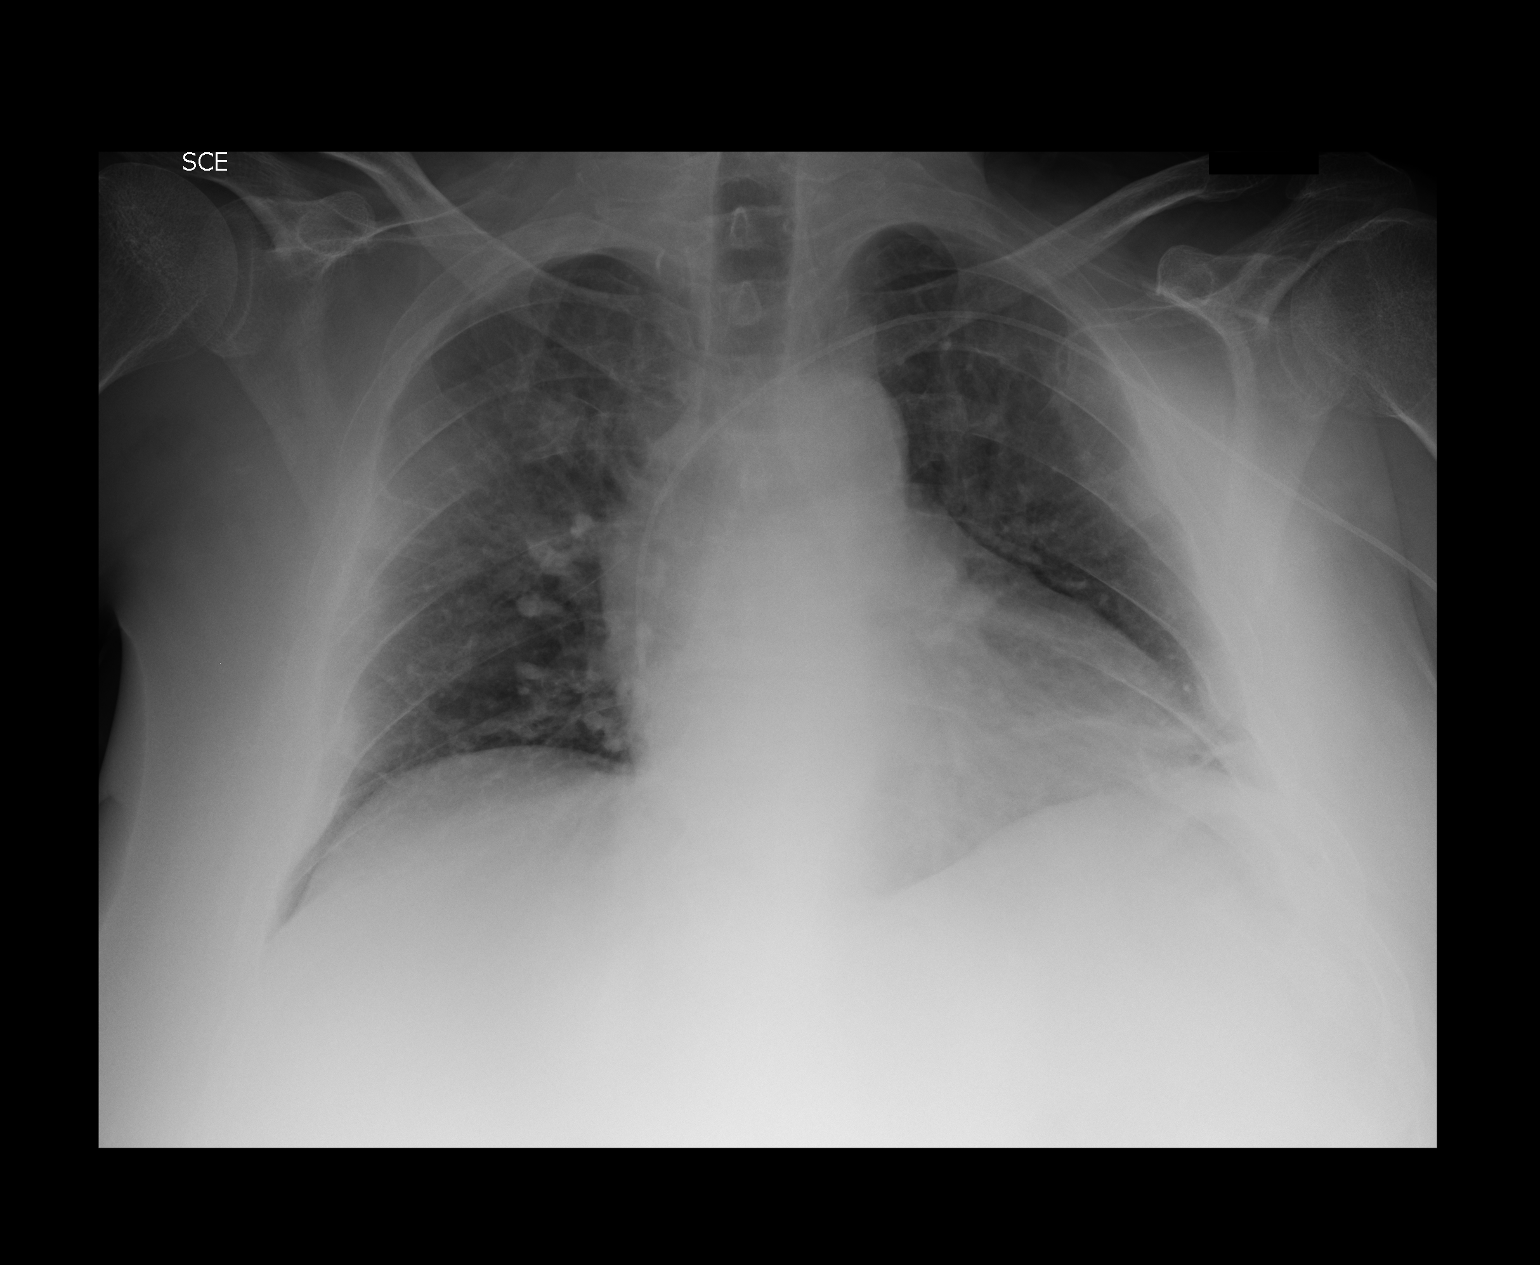

[1 of 1 positions shown; findings below may reference images not displayed]

FINDINGS: Left upper extremity PICC, tip at the upper cavoatrial junction.

Unchanged cardiomegaly and right mediastinal convexity, likely
tortuosity of the ascending aorta.

Lung volumes remain low, and there is persistent subsegmental
atelectasis at the left base. There is no edema, consolidation,
effusion, or pneumothorax.
IMPRESSION: 1. Left upper extremity PICC, tip near the upper cavoatrial
junction.
2. Low lung volumes with left basilar atelectasis.

## 2016-02-01 ENCOUNTER — Encounter (HOSPITAL_COMMUNITY): Payer: Self-pay

## 2016-02-12 ENCOUNTER — Ambulatory Visit: Payer: Self-pay | Admitting: Licensed Clinical Social Worker

## 2016-05-27 ENCOUNTER — Emergency Department: Payer: 59

## 2016-05-27 ENCOUNTER — Inpatient Hospital Stay
Admission: EM | Admit: 2016-05-27 | Discharge: 2016-06-02 | DRG: 183 | Disposition: A | Discharge status: Skilled Nursing Facility | Payer: 59 | Attending: Internal Medicine | Admitting: Internal Medicine

## 2016-05-27 ENCOUNTER — Encounter: Payer: Self-pay | Admitting: Emergency Medicine

## 2016-05-27 DIAGNOSIS — E871 Hypo-osmolality and hyponatremia: Secondary | ICD-10-CM | POA: Diagnosis not present

## 2016-05-27 DIAGNOSIS — I10 Essential (primary) hypertension: Secondary | ICD-10-CM | POA: Diagnosis present

## 2016-05-27 DIAGNOSIS — Z89511 Acquired absence of right leg below knee: Secondary | ICD-10-CM

## 2016-05-27 DIAGNOSIS — Z9884 Bariatric surgery status: Secondary | ICD-10-CM

## 2016-05-27 DIAGNOSIS — Z6829 Body mass index (BMI) 29.0-29.9, adult: Secondary | ICD-10-CM

## 2016-05-27 DIAGNOSIS — E119 Type 2 diabetes mellitus without complications: Secondary | ICD-10-CM | POA: Diagnosis present

## 2016-05-27 DIAGNOSIS — Z833 Family history of diabetes mellitus: Secondary | ICD-10-CM | POA: Diagnosis not present

## 2016-05-27 DIAGNOSIS — E876 Hypokalemia: Secondary | ICD-10-CM | POA: Diagnosis present

## 2016-05-27 DIAGNOSIS — G4730 Sleep apnea, unspecified: Secondary | ICD-10-CM | POA: Diagnosis present

## 2016-05-27 DIAGNOSIS — F419 Anxiety disorder, unspecified: Secondary | ICD-10-CM | POA: Diagnosis present

## 2016-05-27 DIAGNOSIS — R062 Wheezing: Secondary | ICD-10-CM

## 2016-05-27 DIAGNOSIS — G8929 Other chronic pain: Secondary | ICD-10-CM | POA: Diagnosis present

## 2016-05-27 DIAGNOSIS — L8990 Pressure ulcer of unspecified site, unspecified stage: Secondary | ICD-10-CM | POA: Insufficient documentation

## 2016-05-27 DIAGNOSIS — Z9049 Acquired absence of other specified parts of digestive tract: Secondary | ICD-10-CM

## 2016-05-27 DIAGNOSIS — L89129 Pressure ulcer of left upper back, unspecified stage: Secondary | ICD-10-CM

## 2016-05-27 DIAGNOSIS — E039 Hypothyroidism, unspecified: Secondary | ICD-10-CM | POA: Diagnosis present

## 2016-05-27 DIAGNOSIS — L03313 Cellulitis of chest wall: Secondary | ICD-10-CM | POA: Diagnosis present

## 2016-05-27 DIAGNOSIS — Z79899 Other long term (current) drug therapy: Secondary | ICD-10-CM | POA: Diagnosis not present

## 2016-05-27 DIAGNOSIS — S2242XA Multiple fractures of ribs, left side, initial encounter for closed fracture: Secondary | ICD-10-CM

## 2016-05-27 DIAGNOSIS — Z79891 Long term (current) use of opiate analgesic: Secondary | ICD-10-CM

## 2016-05-27 DIAGNOSIS — N3000 Acute cystitis without hematuria: Secondary | ICD-10-CM | POA: Diagnosis present

## 2016-05-27 DIAGNOSIS — W010XXA Fall on same level from slipping, tripping and stumbling without subsequent striking against object, initial encounter: Secondary | ICD-10-CM | POA: Diagnosis present

## 2016-05-27 DIAGNOSIS — E669 Obesity, unspecified: Secondary | ICD-10-CM | POA: Diagnosis present

## 2016-05-27 DIAGNOSIS — R0781 Pleurodynia: Secondary | ICD-10-CM | POA: Diagnosis present

## 2016-05-27 DIAGNOSIS — L89120 Pressure ulcer of left upper back, unstageable: Secondary | ICD-10-CM | POA: Diagnosis present

## 2016-05-27 DIAGNOSIS — Z993 Dependence on wheelchair: Secondary | ICD-10-CM | POA: Diagnosis not present

## 2016-05-27 DIAGNOSIS — S2243XA Multiple fractures of ribs, bilateral, initial encounter for closed fracture: Secondary | ICD-10-CM | POA: Diagnosis present

## 2016-05-27 DIAGNOSIS — F329 Major depressive disorder, single episode, unspecified: Secondary | ICD-10-CM | POA: Diagnosis present

## 2016-05-27 DIAGNOSIS — E43 Unspecified severe protein-calorie malnutrition: Secondary | ICD-10-CM | POA: Insufficient documentation

## 2016-05-27 DIAGNOSIS — S2239XA Fracture of one rib, unspecified side, initial encounter for closed fracture: Secondary | ICD-10-CM | POA: Diagnosis present

## 2016-05-27 DIAGNOSIS — S2241XA Multiple fractures of ribs, right side, initial encounter for closed fracture: Secondary | ICD-10-CM

## 2016-05-27 DIAGNOSIS — S2249XA Multiple fractures of ribs, unspecified side, initial encounter for closed fracture: Secondary | ICD-10-CM | POA: Diagnosis present

## 2016-05-27 LAB — URINALYSIS, COMPLETE (UACMP) WITH MICROSCOPIC
Bilirubin Urine: NEGATIVE
Glucose, UA: NEGATIVE mg/dL
Ketones, ur: 5 mg/dL — AB
NITRITE: NEGATIVE
PROTEIN: NEGATIVE mg/dL
Specific Gravity, Urine: 1.013 (ref 1.005–1.030)
pH: 6 (ref 5.0–8.0)

## 2016-05-27 LAB — TROPONIN I: Troponin I: 0.03 ng/mL (ref <0.03)

## 2016-05-27 MED ORDER — PANTOPRAZOLE SODIUM 40 MG PO TBEC
40.0000 mg | DELAYED_RELEASE_TABLET | Freq: Every day | ORAL | Status: DC
  Administered 2016-05-27 – 2016-06-02 (×7): 40 mg via ORAL
  Filled 2016-05-27 (×7): qty 1

## 2016-05-27 MED ORDER — VITAMIN C 500 MG PO TABS
500.0000 mg | ORAL_TABLET | Freq: Every day | ORAL | Status: DC
  Administered 2016-05-27 – 2016-06-02 (×7): 500 mg via ORAL
  Filled 2016-05-27 (×7): qty 1

## 2016-05-27 MED ORDER — LEVOTHYROXINE SODIUM 75 MCG PO TABS
200.0000 ug | ORAL_TABLET | Freq: Every day | ORAL | Status: DC
  Administered 2016-05-28 – 2016-06-02 (×6): 200 ug via ORAL
  Filled 2016-05-27 (×6): qty 1

## 2016-05-27 MED ORDER — DOCUSATE SODIUM 100 MG PO CAPS
100.0000 mg | ORAL_CAPSULE | Freq: Two times a day (BID) | ORAL | Status: DC
  Administered 2016-05-27 – 2016-05-30 (×6): 100 mg via ORAL
  Filled 2016-05-27 (×9): qty 1

## 2016-05-27 MED ORDER — MORPHINE SULFATE ER 15 MG PO TBCR
15.0000 mg | EXTENDED_RELEASE_TABLET | Freq: Two times a day (BID) | ORAL | Status: DC
  Administered 2016-05-27 – 2016-06-02 (×12): 15 mg via ORAL
  Filled 2016-05-27 (×12): qty 1

## 2016-05-27 MED ORDER — SODIUM CHLORIDE 0.9 % IV SOLN
3.0000 g | Freq: Four times a day (QID) | INTRAVENOUS | Status: DC
  Administered 2016-05-27 – 2016-06-01 (×19): 3 g via INTRAVENOUS
  Filled 2016-05-27 (×21): qty 3

## 2016-05-27 MED ORDER — CALCIUM CARBONATE ANTACID 500 MG PO CHEW
600.0000 mg | CHEWABLE_TABLET | Freq: Two times a day (BID) | ORAL | Status: DC
  Administered 2016-05-28 – 2016-06-02 (×10): 600 mg via ORAL
  Filled 2016-05-27 (×11): qty 3

## 2016-05-27 MED ORDER — OXYCODONE HCL 5 MG PO TABS
5.0000 mg | ORAL_TABLET | ORAL | Status: DC | PRN
  Administered 2016-05-28 – 2016-05-31 (×3): 5 mg via ORAL
  Filled 2016-05-27 (×4): qty 1

## 2016-05-27 MED ORDER — MIRTAZAPINE 15 MG PO TABS
7.5000 mg | ORAL_TABLET | Freq: Every day | ORAL | Status: DC
  Administered 2016-05-27 – 2016-06-01 (×6): 7.5 mg via ORAL
  Filled 2016-05-27 (×6): qty 1

## 2016-05-27 MED ORDER — ENOXAPARIN SODIUM 40 MG/0.4ML ~~LOC~~ SOLN
40.0000 mg | SUBCUTANEOUS | Status: DC
  Administered 2016-05-27 – 2016-06-01 (×6): 40 mg via SUBCUTANEOUS
  Filled 2016-05-27 (×6): qty 0.4

## 2016-05-27 MED ORDER — MORPHINE SULFATE (PF) 2 MG/ML IV SOLN
2.0000 mg | Freq: Once | INTRAVENOUS | Status: AC
  Administered 2016-05-27: 2 mg via INTRAVENOUS
  Filled 2016-05-27: qty 1

## 2016-05-27 MED ORDER — ONDANSETRON HCL 4 MG/2ML IJ SOLN
4.0000 mg | Freq: Once | INTRAMUSCULAR | Status: AC
  Administered 2016-05-27: 4 mg via INTRAVENOUS
  Filled 2016-05-27: qty 2

## 2016-05-27 MED ORDER — DULOXETINE HCL 20 MG PO CPEP
20.0000 mg | ORAL_CAPSULE | Freq: Every day | ORAL | Status: DC
  Administered 2016-05-27 – 2016-06-02 (×7): 20 mg via ORAL
  Filled 2016-05-27 (×7): qty 1

## 2016-05-27 MED ORDER — ACETAMINOPHEN 650 MG RE SUPP
650.0000 mg | Freq: Four times a day (QID) | RECTAL | Status: DC | PRN
Start: 2016-05-27 — End: 2016-06-02

## 2016-05-27 MED ORDER — SODIUM CHLORIDE 0.9 % IV BOLUS (SEPSIS)
1000.0000 mL | Freq: Once | INTRAVENOUS | Status: AC
  Administered 2016-05-27: 1000 mL via INTRAVENOUS

## 2016-05-27 MED ORDER — FOLIC ACID 1 MG PO TABS
1.0000 mg | ORAL_TABLET | Freq: Every day | ORAL | Status: DC
  Administered 2016-05-27 – 2016-06-02 (×7): 1 mg via ORAL
  Filled 2016-05-27 (×7): qty 1

## 2016-05-27 MED ORDER — POLYETHYLENE GLYCOL 3350 17 G PO PACK: 17.0000 g | PACK | Freq: Every day | ORAL | Status: DC | PRN

## 2016-05-27 MED ORDER — FERROUS SULFATE 325 (65 FE) MG PO TABS
325.0000 mg | ORAL_TABLET | Freq: Two times a day (BID) | ORAL | Status: DC
  Administered 2016-05-27 – 2016-06-02 (×12): 325 mg via ORAL
  Filled 2016-05-27 (×12): qty 1

## 2016-05-27 MED ORDER — CITALOPRAM HYDROBROMIDE 10 MG PO TABS
10.0000 mg | ORAL_TABLET | Freq: Every day | ORAL | Status: DC
  Administered 2016-05-27 – 2016-05-29 (×3): 10 mg via ORAL
  Filled 2016-05-27 (×3): qty 1

## 2016-05-27 MED ORDER — ACETAMINOPHEN 325 MG PO TABS
650.0000 mg | ORAL_TABLET | Freq: Four times a day (QID) | ORAL | Status: DC | PRN
Start: 2016-05-27 — End: 2016-06-02

## 2016-05-27 NOTE — ED Notes (Signed)
Pt waiting on bed assignment.  Pt alert.  Iv in place.

## 2016-05-27 NOTE — ED Provider Notes (Signed)
[facility] Emergency Department Provider Note ____________________________________________   I have reviewed the triage vital signs and the triage nursing note.  HISTORY  Chief Complaint Fall   Historian Patient  HPI [name] is a 56 y.o. female with obesity, DM, and a history of bariatric surgery, presents by EMS for concern about possible infection on his left back. Patient states that yesterday he went to sit down on a chair and it slipped or broke and he fell onto it and was unable to get up. He states he would typically probably had a problem getting up off the floor, but what happened last night, at the chair went over and he fell on top of it. His left side was up against the chair and the floor. He was on the floor for about 12 hours. He lives in an apartment, and states that he was yelling for help when someone found him this morning.  He was apparently covered in excrement was taken to the decontamination shower prior to going in his room.   No history of recent fevers. No abdominal pain. He does state that he is hungry and hasn't eaten.  He is pale in appearance and states he does have a history of anemia and takes iron infusions.    Past Medical History:  Diagnosis Date  . Anemia    iron deficient  . Anxiety   . Chicken pox   . Chronic pain   . Depression   . DM type 2 (diabetes mellitus, type 2) (HCC) 2006   Essentially resolved following bariatric surgery, no meds  . History of malnutrition   . History of sleep apnea   . History of vitamin A deficiency   . HTN (hypertension)    History of; resolved following bariatric surgery  . Sleep apnea    no CPAP after losing weight  . Venous stasis     Patient Active Problem List   Diagnosis Date Noted  . Chronic edema   . Finger fracture, right   . Benign essential HTN   . Chronic pain syndrome   . Venous stasis ulcer (HCC)   . Cellulitis 04/28/2015  . Anemia 04/28/2015  . Weakness  generalized 04/28/2015  . Hypokalemia 04/28/2015  . Pressure ulcer of buttock, unstageable (HCC) 04/28/2015  . Scrotal swelling 04/28/2015  . Fall 04/28/2015  . Protein-calorie malnutrition (HCC) 04/28/2015  . Weakness 04/28/2015  . Cellulitis, scrotum   . Failure to thrive in adult   . Fungal dermatitis   . Generalized weakness   . Muscular deconditioning   . Scrotal edema   . Swelling of scrotum   . Hypothyroidism 01/17/2015  . Anemia, iron deficiency 01/17/2015  . Vitamin D deficiency 01/17/2015  . S/P bariatric surgery 01/17/2015  . DM type 2 (diabetes mellitus, type 2) (HCC) 01/17/2015  . Venous stasis ulcer of left lower extremity (HCC) 12/14/2013  . Chronic pain 07/10/2013  . 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 APPENDECTOMY  1991  . BELOW KNEE LEG AMPUTATION Right 2014  . CHOLECYSTECTOMY  1991  . Gastric sleeve, biliopancreatic diversion, duodenal switch  2006  . HERNIA REPAIR  2009  . severe malnutrition  2014  . TONSILLECTOMY AND ADENOIDECTOMY  1967    Prior to Admission medications   Medication Sig Start Date End Date Taking? Authorizing Provider  acetaminophen (TYLENOL) 325 MG tablet Take 2 tablets (650 mg total)  by mouth every 6 (six) hours as needed for mild pain (or Fever >/= 101). 05/01/15  Yes Christiane Ha, MD  Amino Acids-Protein Hydrolys (FEEDING SUPPLEMENT, PRO-STAT SUGAR FREE 64,) LIQD Take 30 mLs by mouth 2 (two) times daily with a meal. Stop date 06/04/15   Yes Historical Provider, MD  calcium carbonate (OSCAL) 1500 (600 Ca) MG TABS tablet Take 600 mg of elemental calcium by mouth 2 (two) times daily with a meal.   Yes Historical Provider, MD  citalopram (CELEXA) 10 MG tablet Take 10 mg by mouth daily. For depression 10/30/11  Yes Historical Provider, MD  DULoxetine (CYMBALTA) 20 MG capsule Take 20 mg by mouth daily.  12/29/14  Yes Historical Provider, MD  ergocalciferol  (VITAMIN D2) 50000 units capsule Take 50,000 Units by mouth once a week.   Yes Historical Provider, MD  ferrous sulfate 325 (65 FE) MG tablet Take 325 mg by mouth 2 (two) times daily.    Yes Historical Provider, MD  folic acid (FOLVITE) 1 MG tablet Take 1 mg by mouth daily. 05/08/16  Yes Historical Provider, MD  levothyroxine (SYNTHROID, LEVOTHROID) 200 MCG tablet TAKE ONE TABLET BY MOUTH DAILY 04/05/15  Yes Tommie Sams, DO  mirtazapine (REMERON) 7.5 MG tablet Take 7.5 mg by mouth at bedtime. For insomnia. Take (1/2 tablet) 12/29/14  Yes Historical Provider, MD  morphine (MS CONTIN) 15 MG 12 hr tablet Take 15 mg by mouth every 12 (twelve) hours. For pain   Yes Historical Provider, MD  Multiple Vitamins-Minerals (DECUBI-VITE PO) Take 1 tablet by mouth daily.   Yes Historical Provider, MD  oxyCODONE-acetaminophen (PERCOCET/ROXICET) 5-325 MG tablet Take 1 tablet by mouth every 4 (four) hours as needed for moderate pain or severe pain (Take 2 tablets every 4 hours as needed for severe pain).   Yes Historical Provider, MD  pantoprazole (PROTONIX) 40 MG tablet Take 40 mg by mouth daily. For GERD 12/29/14  Yes Historical Provider, MD  potassium chloride SA (K-DUR,KLOR-CON) 20 MEQ tablet Take 20 mEq by mouth daily.   Yes Historical Provider, MD  TORSEMIDE PO Take 30 mg by mouth 2 (two) times daily. Hold for sbp < 110   Yes Historical Provider, MD  vitamin C (ASCORBIC ACID) 500 MG tablet Take 500 mg by mouth daily. FOR 30 DAYS   STOP DATE 06/04/15   Yes Historical Provider, MD  Pramoxine-Calamine (AVEENO ANTI-ITCH) 1-3 % CREA Apply 1 Bottle topically every 8 (eight) hours. Patient not taking: Reported on 05/27/2016 05/14/15   Donalee Citrin Ngetich, NP    No Known Allergies  Family History  Problem Relation Age of Onset  . Arthritis Mother   . Diabetes Mother     Social History Social History  Substance Use Topics  . Smoking status: Never Smoker  . Smokeless tobacco: Never Used  . Alcohol use No    Review of  Systems  Constitutional: Negative for fever. Eyes: Negative for visual changes. ENT: Negative for sore throat. Cardiovascular: Negative for chest pain.  Left side of the ribs pain at the location where he was lying with skin breakdown. Respiratory: Negative for shortness of breath. Gastrointestinal: Negative for abdominal pain, vomiting and diarrhea. Genitourinary: Negative for dysuria. Musculoskeletal: Negative for back pain. Skin: Negative for rash. Neurological: Negative for headache. 10 point Review of Systems otherwise negative ____________________________________________   PHYSICAL EXAM:  VITAL SIGNS: ED Triage Vitals  Enc Vitals Group     BP 05/27/16 0932 (!) 120/53     Pulse Rate  05/27/16 0932 92     Resp 05/27/16 0932 (!) 22     Temp 05/27/16 0932 98.6 F (37 C)     Temp Source 05/27/16 0932 Oral     SpO2 05/27/16 0932 100 %     Weight 05/27/16 0932 220 lb (99.8 kg)     Height 05/27/16 0932 6' (1.829 m)     Head Circumference --      Peak Flow --      Pain Score 05/27/16 0931 5     Pain Loc --      Pain Edu? --      Excl. in GC? --      Constitutional: Alert and oriented. Well appearing and in no distress. HEENT   Head: Normocephalic and atraumatic.      Eyes: Conjunctivae are normal. PERRL. Normal extraocular movements.      Ears:         Nose: No congestion/rhinnorhea.   Mouth/Throat: Mucous membranes are moist.   Neck: No stridor.  Nontender C-spine to palpation. Cardiovascular/Chest: Normal rate, regular rhythm.  No murmurs, rubs, or gallops.  Multiple old scars and scar tissue across his chest and abdomen. Left lateral thoracic back, he has several patchy areas of redness and blistering with a area of pallor is not particularly tender. Respiratory: Normal respiratory effort without tachypnea nor retractions. Breath sounds are clear and equal bilaterally. No wheezes/rales/rhonchi. Gastrointestinal: Soft. No distention, no guarding, no rebound.  Nontender.  Obese  Genitourinary/rectal:Deferred Musculoskeletal: Nontender extremities.  Right bka.  Left lower leg with chronic vascular appearance of the skin. Neurologic:  Normal speech and language. No gross or focal neurologic deficits are appreciated. Skin:  Skin is warm, dry and intact. No rash noted.  Very pale. Psychiatric: Mood and affect are normal. Speech and behavior are normal. Patient exhibits appropriate insight and judgment.   ____________________________________________  LABS (pertinent positives/negatives)  Labs Reviewed  BASIC METABOLIC PANEL - Abnormal; Notable for the following:       Result Value   Glucose, Bld 146 (*)    Calcium 8.0 (*)    All other components within normal limits  TROPONIN I - Abnormal; Notable for the following:    Troponin I 0.03 (*)    All other components within normal limits  CBC WITH DIFFERENTIAL/PLATELET - Abnormal; Notable for the following:    WBC 19.2 (*)    RBC 3.35 (*)    Hemoglobin 11.0 (*)    HCT 33.1 (*)    Neutro Abs 17.8 (*)    Lymphs Abs 0.4 (*)    All other components within normal limits  URINALYSIS, COMPLETE (UACMP) WITH MICROSCOPIC - Abnormal; Notable for the following:    Color, Urine AMBER (*)    APPearance HAZY (*)    Hgb urine dipstick SMALL (*)    Ketones, ur 5 (*)    Leukocytes, UA SMALL (*)    Bacteria, UA MANY (*)    Squamous Epithelial / LPF 0-5 (*)    All other components within normal limits    ____________________________________________    EKG I, Governor Rooks, MD, the attending physician have personally viewed and interpreted all ECGs.  93 bpm. Normal sinus rhythm. Narrow QRS. Normal axis. Nonspecific ST and T wave low voltage. ____________________________________________  RADIOLOGY All Xrays were viewed by me. Imaging interpreted by Radiologist.  Left ribs and chest xray: IMPRESSION: No active cardiopulmonary disease. Likely bilateral acute posterolateral rib fractures in the lower  ribcage bilaterally, ribs 7 through 11  bilaterally. No pneumothorax. __________________________________________  PROCEDURES  Procedure(s) performed: None  Critical Care performed: None  ____________________________________________   ED COURSE / ASSESSMENT AND PLAN  Pertinent labs & imaging results that were available during my care of the patient were reviewed by me and considered in my medical decision making (see chart for details).   Although EMS reports patient here for infection, the area of pallor surrounded by erythema and few blisters looks like pressure wound, and patient reports lying over 12 hours in one spot stuck on top of chair.  Patient has evidence of multiple rib fractures bilaterally, ribs 7 through 11 on both sides. Patient looks not only chronically debilitated, but unable to take care of himself at home in this condition with a new ribs. He is not hypoxic. He does take chronic narcotics. I am going to give him a dose of morphine here for comfort.  He does have an elevated white blood count of 19,000, but denies recent illnesses. I'm most suspicious this is likely related to stress response and emargination, on exam he does have erythema to his left lower extremity, however it appears to be chronic vascular changes to my view.  Additionally he has erythema surrounding the pressure wound of the left posterior chest wall, that looks less so related to cellulitis, more so related to the pressure wound.  I discussed with the hospitalist, Dr. Nemiah Commander, we'll leave any additional decision regarding antibiotic coverage to hospitalist team, at this point does not appear to me that he is actively infected.    CONSULTATIONS:  Dr. Nemiah Commander, hospitalist team for admission.  Patient / Family / Caregiver informed of clinical course, medical decision-making process, and agree with plan.    ___________________________________________   FINAL CLINICAL IMPRESSION(S) / ED  DIAGNOSES   Final diagnoses:  Decubitus ulcer of left upper back, unspecified ulcer stage  Closed fracture of multiple ribs of right side, initial encounter  Closed fracture of multiple ribs of left side, initial encounter              Note: This dictation was prepared with Dragon dictation. Any transcriptional errors that result from this process are unintentional    Governor Rooks, MD 05/27/16 1339

## 2016-05-27 NOTE — ED Triage Notes (Signed)
Pt via ems from home after falling last night out of his chair and being unable to get up. Pt has large pressure wound on his back, presumably from pressing against chair. Pt very pale, states he has hx of anemia. Pt has multiple torso scars, from gastric bypass, skin removal. Pt also has below knee amputation of right leg. Pt alert & oriented; nad noted.

## 2016-05-27 NOTE — H&P (Signed)
[facility] [place] [facility]   PATIENT NAME: [name]    MR#:  [id number]  DATE OF BIRTH:  1960/12/08  DATE OF ADMISSION:  05/27/2016  PRIMARY CARE PHYSICIAN: Pcp Not In System   REQUESTING/REFERRING PHYSICIAN: Dr. Governor Rooks  CHIEF COMPLAINT:   Chief Complaint  Patient presents with  . Fall    HISTORY OF PRESENT ILLNESS:  [name]  is a 56 y.o. female with a known history of Multiple medical problems including diabetes mellitus, depression, hypertension, sleep apnea, anemia, right BKA presents to hospital after a fall. Patient is a very poor historian. Due to his BKA, he is usually wheelchair bound at baseline. He said he slipped from his chair yesterday and fell between his bed and chair and was unable to get up. He spent almost 12 hours on the floor and started screaming this morning when his neighbor heard him and called 911. Labs reveal increased white cell count, he had a pressure ulcer on the left chest with superficial necrosis. Chest x-ray revealing bilateral posterior rib fractures from 7-11. He is in extreme pain and unable to move and needs to be admitted. Denies any fevers. No recent cough, congestion, chest pain, nausea or vomiting. Also noted to have urinary tract infection.  PAST MEDICAL HISTORY:   Past Medical History:  Diagnosis Date  . Anemia    iron deficient  . Anxiety   . Chicken pox   . Chronic pain   . Depression   . DM type 2 (diabetes mellitus, type 2) (HCC) 2006   Essentially resolved following bariatric surgery, no meds  . History of malnutrition   . History of sleep apnea   . History of vitamin A deficiency   . HTN (hypertension)    History of; resolved following bariatric surgery  . Sleep apnea    no CPAP after losing weight  . Venous stasis     PAST SURGICAL HISTORY:   Past Surgical History:  Procedure Laterality Date  . APPENDECTOMY  1991  . BELOW KNEE LEG AMPUTATION Right 2014  . CHOLECYSTECTOMY  1991    . Gastric sleeve, biliopancreatic diversion, duodenal switch  2006  . HERNIA REPAIR  2009  . severe malnutrition  2014  . TONSILLECTOMY AND ADENOIDECTOMY  1967    SOCIAL HISTORY:   Social History  Substance Use Topics  . Smoking status: Never Smoker  . Smokeless tobacco: Never Used  . Alcohol use No    FAMILY HISTORY:   Family History  Problem Relation Age of Onset  . Arthritis Mother   . Diabetes Mother     DRUG ALLERGIES:  No Known Allergies  REVIEW OF SYSTEMS:   Review of Systems  Constitutional: Positive for malaise/fatigue. Negative for chills, fever and weight loss.  HENT: Negative for ear discharge, ear pain, hearing loss and nosebleeds.   Eyes: Negative for blurred vision, double vision and photophobia.  Respiratory: Negative for cough, hemoptysis, shortness of breath and wheezing.   Cardiovascular: Negative for chest pain, palpitations, orthopnea and leg swelling.  Gastrointestinal: Negative for abdominal pain, constipation, diarrhea, melena, nausea and vomiting.  Genitourinary: Positive for dysuria. Negative for urgency.  Musculoskeletal: Positive for back pain and myalgias. Negative for neck pain.  Skin: Negative for rash.  Neurological: Negative for dizziness, tremors, sensory change, speech change and focal weakness.  Endo/Heme/Allergies: Does not bruise/bleed easily.  Psychiatric/Behavioral: Negative for depression.    MEDICATIONS AT HOME:   Prior to Admission medications  Medication Sig Start Date End Date Taking? Authorizing Provider  acetaminophen (TYLENOL) 325 MG tablet Take 2 tablets (650 mg total) by mouth every 6 (six) hours as needed for mild pain (or Fever >/= 101). 05/01/15  Yes Christiane Ha, MD  Amino Acids-Protein Hydrolys (FEEDING SUPPLEMENT, PRO-STAT SUGAR FREE 64,) LIQD Take 30 mLs by mouth 2 (two) times daily with a meal. Stop date 06/04/15   Yes Historical Provider, MD  calcium carbonate (OSCAL) 1500 (600 Ca) MG TABS tablet Take  600 mg of elemental calcium by mouth 2 (two) times daily with a meal.   Yes Historical Provider, MD  citalopram (CELEXA) 10 MG tablet Take 10 mg by mouth daily. For depression 10/30/11  Yes Historical Provider, MD  DULoxetine (CYMBALTA) 20 MG capsule Take 20 mg by mouth daily.  12/29/14  Yes Historical Provider, MD  ergocalciferol (VITAMIN D2) 50000 units capsule Take 50,000 Units by mouth once a week.   Yes Historical Provider, MD  ferrous sulfate 325 (65 FE) MG tablet Take 325 mg by mouth 2 (two) times daily.    Yes Historical Provider, MD  folic acid (FOLVITE) 1 MG tablet Take 1 mg by mouth daily. 05/08/16  Yes Historical Provider, MD  levothyroxine (SYNTHROID, LEVOTHROID) 200 MCG tablet TAKE ONE TABLET BY MOUTH DAILY 04/05/15  Yes Tommie Sams, DO  mirtazapine (REMERON) 7.5 MG tablet Take 7.5 mg by mouth at bedtime. For insomnia. Take (1/2 tablet) 12/29/14  Yes Historical Provider, MD  morphine (MS CONTIN) 15 MG 12 hr tablet Take 15 mg by mouth every 12 (twelve) hours. For pain   Yes Historical Provider, MD  Multiple Vitamins-Minerals (DECUBI-VITE PO) Take 1 tablet by mouth daily.   Yes Historical Provider, MD  oxyCODONE-acetaminophen (PERCOCET/ROXICET) 5-325 MG tablet Take 1 tablet by mouth every 4 (four) hours as needed for moderate pain or severe pain (Take 2 tablets every 4 hours as needed for severe pain).   Yes Historical Provider, MD  pantoprazole (PROTONIX) 40 MG tablet Take 40 mg by mouth daily. For GERD 12/29/14  Yes Historical Provider, MD  potassium chloride SA (K-DUR,KLOR-CON) 20 MEQ tablet Take 20 mEq by mouth daily.   Yes Historical Provider, MD  TORSEMIDE PO Take 30 mg by mouth 2 (two) times daily. Hold for sbp < 110   Yes Historical Provider, MD  vitamin C (ASCORBIC ACID) 500 MG tablet Take 500 mg by mouth daily. FOR 30 DAYS   STOP DATE 06/04/15   Yes Historical Provider, MD  Pramoxine-Calamine (AVEENO ANTI-ITCH) 1-3 % CREA Apply 1 Bottle topically every 8 (eight) hours. Patient not  taking: Reported on 05/27/2016 05/14/15   Donalee Citrin Ngetich, NP      VITAL SIGNS:  Blood pressure 107/89, pulse 92, temperature 98.6 F (37 C), temperature source Oral, resp. rate 15, height 6' (1.829 m), weight 99.8 kg (220 lb), SpO2 93 %.  PHYSICAL EXAMINATION:   Physical Exam  GENERAL:  56 y.o.-year-old patient lying in the bed with no acute distress. disheleved appearing EYES: Pupils equal, round, reactive to light and accommodation. No scleral icterus. Extraocular muscles intact.  HEENT: Head atraumatic, normocephalic. Oropharynx and nasopharynx clear.  NECK:  Supple, no jugular venous distention. No thyroid enlargement, no tenderness.  LUNGS: Normal breath sounds bilaterally, no wheezing, rales,rhonchi or crepitation. No use of accessory muscles of respiration.  CARDIOVASCULAR: S1, S2 normal. No murmurs, rubs, or gallops.  ABDOMEN: Soft, nontender, nondistended. Bowel sounds present. No organomegaly or mass.  EXTREMITIES: s/p right BKA, left leg  is erythematous with superficial ulcerations NEUROLOGIC: Cranial nerves II through XII are intact. Muscle strength 5/5 in all extremities. Sensation intact. Gait not checked.  PSYCHIATRIC: The patient is alert and oriented x 3.  SKIN: Left lateral chest wall, blisters with skin rupture and dry gangrenous spot from pressure  LABORATORY PANEL:   CBC  Recent Labs Lab 05/27/16 0944  WBC 19.2*  HGB 11.0*  HCT 33.1*  PLT 250   ------------------------------------------------------------------------------------------------------------------  Chemistries   Recent Labs Lab 05/27/16 0944  NA 135  K 3.7  CL 103  CO2 25  GLUCOSE 146*  BUN 14  CREATININE 0.89  CALCIUM 8.0*   ------------------------------------------------------------------------------------------------------------------  Cardiac Enzymes  Recent Labs Lab 05/27/16 0944  TROPONINI 0.03*    ------------------------------------------------------------------------------------------------------------------  RADIOLOGY:  Dg Ribs Unilateral W/chest Left  Result Date: 05/27/2016 CLINICAL DATA:  Larey Seat out of chair last night. Unable to stand. Left-sided chest pain. EXAM: LEFT RIBS AND CHEST - 3+ VIEW COMPARISON:  04/16/2014 FINDINGS: Heart size is normal. The aorta shows atherosclerosis. There is mild scarring in both lower lungs. Left rib details show multiple nondisplaced rib fractures in the lower thoracic region, probably affecting ribs 7 through 11. No pneumothorax. Chest radiography appears to show recent rib fractures also on the right in the lower posterolateral ribs. IMPRESSION: No active cardiopulmonary disease. Likely bilateral acute posterolateral rib fractures in the lower ribcage bilaterally, ribs 7 through 11 bilaterally. No pneumothorax. Electronically Signed   By: Paulina Fusi M.D.   On: 05/27/2016 10:24    EKG:   Orders placed or performed during the hospital encounter of 05/27/16  . EKG 12-Lead  . EKG 12-Lead    IMPRESSION AND PLAN:   Hajar Penninger  is a 56 y.o. female with a known history of Multiple medical problems including diabetes mellitus, depression, hypertension, sleep apnea, anemia, right BKA presents to hospital after a fall.  #1 Acute cystitis and chest wall cellulitis-blood cultures and urine cultures sent for.  Wound care consult for dressing the chest wall wounds. Likely pressure necrosis from fall. -On Unasyn  #2 Fall-secondary to weakness. Physical therapy consulted. Might likely need placement. Stays at home by himself -Check CK level  #3 depression-continue home medications. Patient on low-doses of Cymbalta, Celexa and Remeron  #4 Chronic pain-continue home pain medications. Patient on MS Contin and breakthrough pain medicines.  #5 hypothyroidism-continue Synthroid  #6 DVT prophylaxis-on Lovenox    All the records are reviewed and case  discussed with ED provider. Management plans discussed with the patient, family and they are in agreement.  CODE STATUS: Full Code  TOTAL TIME TAKING CARE OF THIS PATIENT: 50 minutes.    Enid Baas M.D on 05/27/2016 at 2:24 PM  Between 7am to 6pm - Pager - [phone]  After 6pm go to [email or web address] - password [email or web address]  [facility]  Office  [phone]  CC: Primary care physician; Pcp Not In System

## 2016-05-27 NOTE — Progress Notes (Signed)
ANTIBIOTIC CONSULT NOTE - INITIAL  Pharmacy Consult for Unasyn  Indication: cellulitis  No Known Allergies  Patient Measurements: Height: 6' (182.9 cm) Weight: 220 lb (99.8 kg) IBW/kg (Calculated) : 77.6 Adjusted Body Weight:   Vital Signs: Temp: 97.8 F (36.6 C) (04/03 2049) Temp Source: Oral (04/03 2049) BP: 97/43 (04/03 2049) Pulse Rate: 92 (04/03 2049) Intake/Output from previous day: No intake/output data recorded. Intake/Output from this shift: No intake/output data recorded.  Labs:  Recent Labs  05/27/16 0944  WBC 19.2*  HGB 11.0*  PLT 250  CREATININE 0.89   Estimated Creatinine Clearance: 116.1 mL/min (by C-G formula based on SCr of 0.89 mg/dL). No results for input(s): VANCOTROUGH, VANCOPEAK, VANCORANDOM, GENTTROUGH, GENTPEAK, GENTRANDOM, TOBRATROUGH, TOBRAPEAK, TOBRARND, AMIKACINPEAK, AMIKACINTROU, AMIKACIN in the last 72 hours.   Microbiology: No results found for this or any previous visit (from the past 720 hour(s)).  Medical History: Past Medical History:  Diagnosis Date  . Anemia    iron deficient  . Anxiety   . Chicken pox   . Chronic pain   . Depression   . DM type 2 (diabetes mellitus, type 2) (HCC) 2006   Essentially resolved following bariatric surgery, no meds  . History of malnutrition   . History of sleep apnea   . History of vitamin A deficiency   . HTN (hypertension)    History of; resolved following bariatric surgery  . Sleep apnea    no CPAP after losing weight  . Venous stasis     Medications:  Scheduled:  . ampicillin-sulbactam (UNASYN) IV  3 g Intravenous Q6H  . [START ON 05/28/2016] calcium carbonate  600 mg of elemental calcium Oral BID WC  . citalopram  10 mg Oral Daily  . docusate sodium  100 mg Oral BID  . DULoxetine  20 mg Oral Daily  . enoxaparin (LOVENOX) injection  40 mg Subcutaneous Q24H  . ferrous sulfate  325 mg Oral BID  . folic acid  1 mg Oral Daily  . [START ON 05/28/2016] levothyroxine  200 mcg Oral QAC  breakfast  . mirtazapine  7.5 mg Oral QHS  . morphine  15 mg Oral Q12H  . pantoprazole  40 mg Oral Daily  . vitamin C  500 mg Oral Daily   Assessment: CrCl = 116.1 ml/min   Goal of Therapy:  resolution of infection  Plan:  Expected duration 7 days with resolution of temperature and/or normalization of WBC   Will start Unasyn 3 gm IV Q6H on 4/3 :00.   Khyra Viscuso D 05/27/2016,9:24 PM

## 2016-05-27 NOTE — ED Notes (Signed)
Pt alert.  Continues to wait on bed assignment.  t

## 2016-05-27 NOTE — ED Notes (Signed)
Report called to stacey rn floor nurse.  

## 2016-05-28 DIAGNOSIS — L8990 Pressure ulcer of unspecified site, unspecified stage: Secondary | ICD-10-CM | POA: Insufficient documentation

## 2016-05-28 LAB — MAGNESIUM: Magnesium: 1.9 mg/dL (ref 1.7–2.4)

## 2016-05-28 MED ORDER — PREMIER PROTEIN SHAKE
11.0000 [oz_av] | Freq: Four times a day (QID) | ORAL | Status: DC
  Administered 2016-05-28 – 2016-06-02 (×6): 11 [oz_av] via ORAL

## 2016-05-28 MED ORDER — POTASSIUM CHLORIDE CRYS ER 20 MEQ PO TBCR
40.0000 meq | EXTENDED_RELEASE_TABLET | Freq: Once | ORAL | Status: AC
  Administered 2016-05-28: 40 meq via ORAL
  Filled 2016-05-28: qty 2

## 2016-05-28 MED ORDER — COLLAGENASE 250 UNIT/GM EX OINT
TOPICAL_OINTMENT | Freq: Every day | CUTANEOUS | Status: DC
  Administered 2016-05-28 – 2016-06-02 (×6): via TOPICAL
  Filled 2016-05-28: qty 30

## 2016-05-28 MED ORDER — SODIUM CHLORIDE 0.9 % IV SOLN
INTRAVENOUS | Status: DC
  Administered 2016-05-28 – 2016-05-31 (×6): via INTRAVENOUS

## 2016-05-28 NOTE — Plan of Care (Signed)
Problem: Activity: Goal: Risk for activity intolerance will decrease Outcome: Not Progressing Patient is wheelchair bound.     

## 2016-05-28 NOTE — Consult Note (Signed)
WOC Nurse wound consult note Reason for Consult: Full thickness pressure injury, unstageable to left lateral  flank.  Fall at home with prolonged time down.  Wound type:Unstageable pressure injury Pressure Injury POA: Yes Measurement: 7.2 cm x 5.6 cm unable to visualize wound bed.  75% adherent devitalized tissue.  Wound bed: devitalized tissue Drainage (amount, consistency, odor) moderate serosanguinous.  No odor.  Periwound:Blanchable erythema Dressing procedure/placement/frequency:Cleanse wound to left lateral chest wall with NS and pat gently dry.  Apply Santyl to wound bed. Cover with NS moist gauze.  Secure with ABD pad and tape.  Change daily.  Will not follow at this time.  Please re-consult if needed.  Maple Hudson RN BSN CWON Pager [phone]

## 2016-05-28 NOTE — Progress Notes (Signed)
[facility] -  at [facility]   PATIENT NAME: [name]    MR#:  [id number]  DATE OF BIRTH:  Mar 27, 1960  SUBJECTIVE:  CHIEF COMPLAINT:   Chief Complaint  Patient presents with  . Fall  feels ok, in some pain REVIEW OF SYSTEMS:  Review of Systems  Constitutional: Negative for chills, fever and weight loss.  HENT: Negative for nosebleeds and sore throat.   Eyes: Negative for blurred vision.  Respiratory: Negative for cough, shortness of breath and wheezing.   Cardiovascular: Negative for chest pain, orthopnea, leg swelling and PND.  Gastrointestinal: Negative for abdominal pain, constipation, diarrhea, heartburn, nausea and vomiting.  Genitourinary: Negative for dysuria and urgency.  Musculoskeletal: Positive for falls and myalgias. Negative for back pain.  Skin: Negative for rash.  Neurological: Negative for dizziness, speech change, focal weakness and headaches.  Endo/Heme/Allergies: Does not bruise/bleed easily.  Psychiatric/Behavioral: Negative for depression.    DRUG ALLERGIES:  No Known Allergies VITALS:  Blood pressure 119/71, pulse 96, temperature 98.1 F (36.7 C), temperature source Oral, resp. rate 19, height 6' (1.829 m), weight 99.8 kg (220 lb), SpO2 94 %. PHYSICAL EXAMINATION:  Physical Exam  Constitutional: He is oriented to person, place, and time and well-developed, well-nourished, and in no distress.  HENT:  Head: Normocephalic and atraumatic.  Eyes: Conjunctivae and EOM are normal. Pupils are equal, round, and reactive to light.  Neck: Normal range of motion. Neck supple. No tracheal deviation present. No thyromegaly present.  Cardiovascular: Normal rate, regular rhythm and normal heart sounds.   Pulmonary/Chest: Effort normal and breath sounds normal. No respiratory distress. He has no wheezes. He exhibits no tenderness.  Abdominal: Soft. Bowel sounds are normal. He exhibits no distension. There is no tenderness.    Musculoskeletal: Normal range of motion.  Neurological: He is alert and oriented to person, place, and time. No cranial nerve deficit.  Skin: Skin is warm and dry. No rash noted.  Psychiatric: Mood and affect normal.   LABORATORY PANEL:  Female CBC  Recent Labs Lab 05/28/16 0458  WBC 17.1*  HGB 10.6*  HCT 31.6*  PLT 211   ------------------------------------------------------------------------------------------------------------------ Chemistries   Recent Labs Lab 05/28/16 0458  NA 133*  K 3.2*  CL 103  CO2 24  GLUCOSE 75  BUN 12  CREATININE 0.81  CALCIUM 7.5*   RADIOLOGY:  No results found. ASSESSMENT AND PLAN:  [name]  is a 56 y.o. female with a known history of Multiple medical problems including diabetes mellitus, depression, hypertension, sleep apnea, anemia, right BKA presents to hospital after a fall.  #1 Acute cystitis and chest wall cellulitis-blood cultures and urine cultures sent for.  Wound care consult for dressing the chest wall wounds. Likely pressure necrosis from fall. - continue Unasyn  #2 Fall-secondary to weakness. Physical therapy consulted. Might likely need placement. Stays at home by himself - CK level 828  * Hypokalemia - replete and recheck  #3 depression-continue low-doses of Cymbalta, Celexa and Remeron  #4 Chronic pain-continue home pain medications. Patient on MS Contin and breakthrough pain medicines.  #5 hypothyroidism-continue Synthroid  #6 DVT prophylaxis-on Lovenox     All the records are reviewed and case discussed with Care Management/Social Worker. Management plans discussed with the patient, nursing and they are in agreement.  CODE STATUS: Full Code  TOTAL TIME TAKING CARE OF THIS PATIENT: 35 minutes.   More than 50% of the time was spent in counseling/coordination of care: YES  POSSIBLE D/C IN  1-2 DAYS, DEPENDING ON CLINICAL CONDITION.   Delfino Lovett M.D on 05/28/2016 at 6:20 PM  Between 7am to 6pm -  Pager - [phone]  After 6pm go to [email or web address] - Social research officer, government  [facility]  Office  [phone]  CC: Primary care physician; Pcp Not In System  Note: This dictation was prepared with Dragon dictation along with smaller phrase technology. Any transcriptional errors that result from this process are unintentional.

## 2016-05-29 DIAGNOSIS — E43 Unspecified severe protein-calorie malnutrition: Secondary | ICD-10-CM | POA: Insufficient documentation

## 2016-05-29 LAB — URINE CULTURE

## 2016-05-29 LAB — MAGNESIUM
MAGNESIUM: 2 mg/dL (ref 1.7–2.4)
MAGNESIUM: 2 mg/dL (ref 1.7–2.4)

## 2016-05-29 LAB — PHOSPHORUS: PHOSPHORUS: 2.8 mg/dL (ref 2.5–4.6)

## 2016-05-29 MED ORDER — SIMETHICONE 80 MG PO CHEW
80.0000 mg | CHEWABLE_TABLET | Freq: Four times a day (QID) | ORAL | Status: DC | PRN
  Administered 2016-05-29 – 2016-06-02 (×3): 80 mg via ORAL
  Filled 2016-05-29 (×3): qty 1

## 2016-05-29 MED ORDER — ADULT MULTIVITAMIN LIQUID CH
15.0000 mL | Freq: Every day | ORAL | Status: DC
  Administered 2016-05-29 – 2016-06-02 (×5): 15 mL via ORAL
  Filled 2016-05-29 (×5): qty 15

## 2016-05-29 MED ORDER — POTASSIUM CHLORIDE CRYS ER 20 MEQ PO TBCR
40.0000 meq | EXTENDED_RELEASE_TABLET | Freq: Once | ORAL | Status: AC
Start: 2016-05-29 — End: 2016-05-29
  Administered 2016-05-29 (×2): 40 meq via ORAL
  Filled 2016-05-29: qty 2

## 2016-05-29 NOTE — Progress Notes (Signed)
Initial Nutrition Assessment  DOCUMENTATION CODES:   Severe malnutrition in context of chronic illness  INTERVENTION:  -Continue Premier Protein QID -Liberalize diet to Regular -Pt would benefit from smaller, more frequent meals as well as increasing of protein supplement at home -Pt at risk for multiple nutrient deficiencies including fatty acid deficiency and deficiency of the fat-soluble vitamins. Recommend addition of liquid MVI, Vitamin B-12, Vitamin A, D, E and K, iron to current regimen; pt may benefit from addition of zinc as well -Request new weight as current wt is from admission date and unsure if measured or estimated  NUTRITION DIAGNOSIS:   Malnutrition related to chronic illness as evidenced by severe depletion of body fat, severe depletion of muscle mass, moderate to severe fluid accumulation.  GOAL:   Patient will meet greater than or equal to 90% of their needs  MONITOR:   PO intake, Supplement acceptance, Labs, Weight trends  REASON FOR ASSESSMENT:   Consult Assessment of nutrition requirement/status (hx of bariatric surgery)  ASSESSMENT:   56 yo female admitted s/p fall from wheelchair with weakness, acute cystitis and chest wall cellulitis. Pt with hx of DM, depression, HTN, right BKA. Pt with hx with gastric sleeve, bilipopancreatic diversion, duodenal switch which was performed in [place] as well as malnutrition requiring TPN. Pt is wheelchair bound at baseline, spent almost 12 hours on the ground after fall  Pt reports his goal is to consume 120 grams of protein per day. Pt reports he drinks 1-2 Premier Protein shakes per day (each shake has 30 g of protein and 160 kcals). Pt lives at home alone. Pt reports he eats 3 meals per day, mostly take out but unable to tell writer exactly what he eats at a meal period. Did mention multiple times that he likes spaghetti. Pt does report that he really does not snack  Pt reports he weighed >400 pounds prior to bariatric  surgery; current weight documented as 220 pounds, current dry weight unknown as pt with moderate edema in abdomen and LE  Pt has been unable to participate in PT thus far; pt reports he is exremely weak  Pt reports he has 65 cm of common channel post bariatric surgery. In most cases of bariatric surgery, common channel is typically anywhere from 100-250 cm. Also reports he was told by MD that he only absorbs 10% of the fat he consumes  Pt reports he takes a whole host of supplements including gummy vitamins (cannot tolerate pill form), Vitamin D3, Bone Health supplement, Fiber, Vitamin A, B complex, Iron, Calicum via Tums, Vitamin C. Noted pt with documented hx of Vitamin A and D deficiency as well as Iron-deficiency anemia requiring IV iron transfusions  Nutrition-Focused physical exam completed. Findings are severe fat depletion (orbital and tricep region), severe muscle depletion (temporal, clavicle, shoulder, clavicle/acromion region, and hand) and moderate edema. Pt also noted to have wasting in leg but pt is wheel chair bound Pt has pt also has puncture wounds on and around shoulder area that he reports were caused by his cat as well as wound on left leg that pt reports he has been changing the wound dressing at home but reports significant weeping from the region  Labs: sodium 129, potassium 3.0 Meds: folic acid, remeron, tums with meals, ferrous sulfate, vitamin C   Diet Order:  Heart Healthy  Skin:  (P) Wound (see comment)  Last BM:  05/29/16  Height:   Ht Readings from Last 1 Encounters:  05/27/16 6' (1.829 m)  Weight:   Wt Readings from Last 1 Encounters:  05/27/16 220 lb (99.8 kg)    Ideal Body Weight:  75.9 kg with BKA taken into account, 131% IBW with current wt  BMI:  Body mass index is 29.84 kg/m.  Estimated Nutritional Needs:   Kcal:  2100-2400 kcals  Protein:  120-140 g  Fluid:  >/= 2 L  EDUCATION NEEDS:   No education needs identified at this  time Romelle Starcher MS, RD, LDN [phone] Pager  [phone] Weekend/On-Call Pager

## 2016-05-29 NOTE — Progress Notes (Signed)
[employer] - [place] at [facility]   PATIENT NAME: [name]    MR#:  [id number]  DATE OF BIRTH:  04-Oct-1960  SUBJECTIVE:  CHIEF COMPLAINT:   Chief Complaint  Patient presents with  . Fall  He does not feel he can go home like this.  Still in pain.  Unable to participate with physical therapy.  Sodium and potassium dropped REVIEW OF SYSTEMS:  Review of Systems  Constitutional: Negative for chills, fever and weight loss.  HENT: Negative for nosebleeds and sore throat.   Eyes: Negative for blurred vision.  Respiratory: Negative for cough, shortness of breath and wheezing.   Cardiovascular: Negative for chest pain, orthopnea, leg swelling and PND.  Gastrointestinal: Negative for abdominal pain, constipation, diarrhea, heartburn, nausea and vomiting.  Genitourinary: Negative for dysuria and urgency.  Musculoskeletal: Positive for falls and myalgias. Negative for back pain.  Skin: Negative for rash.  Neurological: Negative for dizziness, speech change, focal weakness and headaches.  Endo/Heme/Allergies: Does not bruise/bleed easily.  Psychiatric/Behavioral: Negative for depression.   DRUG ALLERGIES:  No Known Allergies VITALS:  Blood pressure 101/61, pulse 84, temperature 98 F (36.7 C), temperature source Oral, resp. rate 18, height 6' (1.829 m), weight 99.8 kg (220 lb), SpO2 97 %. PHYSICAL EXAMINATION:  Physical Exam  Constitutional: He is oriented to person, place, and time and well-developed, well-nourished, and in no distress.  HENT:  Head: Normocephalic and atraumatic.  Eyes: Conjunctivae and EOM are normal. Pupils are equal, round, and reactive to light.  Neck: Normal range of motion. Neck supple. No tracheal deviation present. No thyromegaly present.  Cardiovascular: Normal rate, regular rhythm and normal heart sounds.   Pulmonary/Chest: Effort normal and breath sounds normal. No respiratory distress. He has no wheezes. He exhibits no tenderness.    Abdominal: Soft. Bowel sounds are normal. He exhibits no distension. There is no tenderness.  Musculoskeletal: Normal range of motion.  Neurological: He is alert and oriented to person, place, and time. No cranial nerve deficit.  Skin: Skin is warm and dry. No rash noted.  Psychiatric: Mood and affect normal.   LABORATORY PANEL:  Female CBC  Recent Labs Lab 05/29/16 0426  WBC 10.1  HGB 10.3*  HCT 31.4*  PLT 195   ------------------------------------------------------------------------------------------------------------------ Chemistries   Recent Labs Lab 05/29/16 0426  NA 129*  K 3.0*  CL 102  CO2 23  GLUCOSE 132*  BUN 14  CREATININE 0.88  CALCIUM 7.2*  MG 2.0  2.0   RADIOLOGY:  No results found. ASSESSMENT AND PLAN:  [name]  is a 56 y.o. female with a known history of Multiple medical problems including diabetes mellitus, depression, hypertension, sleep apnea, anemia, right BKA presents to hospital after a fall.  * Acute cystitis and chest wall cellulitis-blood cultures Negative and urine cultures growing mixed organism Wound care consult for dressing the chest wall wounds. Likely pressure necrosis from fall. - continue Unasyn  * Hyponatremia: - Na drop from 133->129 - Nephro c/s - stop celexa as this can contribute - On NS  * Fall-secondary to weakness. Physical therapy consulted - unable to participate. Might likely need placement. Stays at home by himself - CK level 828  * Hypokalemia - replete and recheck  * depression-continue low-doses of Cymbalta, Celexa and Remeron  * Chronic pain-continue home pain medications. Patient on MS Contin and breakthrough pain medicines.  * hypothyroidism-continue Synthroid  * DVT prophylaxis-on Lovenox  Patient is unable to participate with physical therapy yet.  Will likely need rehabilitation placement.   All the records are reviewed and case discussed with Care Management/Social Worker. Management  plans discussed with the patient, nursing and they are in agreement.  CODE STATUS: Full Code  TOTAL TIME TAKING CARE OF THIS PATIENT: 25 minutes.   More than 50% of the time was spent in counseling/coordination of care: YES  POSSIBLE D/C IN 1-2 DAYS, DEPENDING ON CLINICAL CONDITION.   Delfino Lovett M.D on 05/29/2016 at 2:13 PM  Between 7am to 6pm - Pager - [phone]  After 6pm go to [email or web address] - Social research officer, government  [facility] [place] [facility]  Office  [phone]  CC: Primary care physician; Pcp Not In System  Note: This dictation was prepared with Dragon dictation along with smaller phrase technology. Any transcriptional errors that result from this process are unintentional.

## 2016-05-29 NOTE — Progress Notes (Signed)
PT Cancellation Note  Patient Details Name: [name] MRN: [id number] DOB: [birth year]   Cancelled Treatment:    Reason Eval/Treat Not Completed: Fatigue/lethargy limiting ability to participate;Other (comment) (Pt feeling sick, poorly).  Cystitis, chest cellulitis, rib fractures, and likely to need 2 assist as he is a R Bk amputee.  Check tomorrow as pt will allow.   Ivar Drape 05/29/2016, 1:47 PM   Samul Dada, PT MS Acute Rehab Dept. Number: [facility] [id number] and [facility] [phone]

## 2016-05-30 MED ORDER — POTASSIUM CHLORIDE CRYS ER 20 MEQ PO TBCR
40.0000 meq | EXTENDED_RELEASE_TABLET | Freq: Once | ORAL | Status: AC
Start: 2016-05-30 — End: 2016-05-30
  Administered 2016-05-30: 40 meq via ORAL
  Filled 2016-05-30: qty 2

## 2016-05-30 NOTE — Evaluation (Signed)
Physical Therapy Evaluation Patient Details Name: [name] MRN: [id number] DOB: [birth year] Today's Date: 05/30/2016   History of Present Illness  56 yo female with onset of fall from power chair with chair falling on top of him has sustained L 7-11 rib fractures and cellulitis from skin abrasions, UTI.  PMHx:  DM, R BKA with prosthesis, HTN, depression, chronic pain   Clinical Impression  Pt is up to side of bed with some significant difficulty, in pain and weak, head not feeling right.  Pt is medicated for pain but finally did not feel confident getting up to chair.  He is appropriate for this and will pull to daily therapy to increase his mobility and see if SNF is still needed after acute therapy has intervened.  For now is very appropriate to increase strength, mobility, and facilitate independent transfers and bed mobility.      Follow Up Recommendations SNF    Equipment Recommendations  None recommended by PT    Recommendations for Other Services       Precautions / Restrictions Precautions Precautions: Fall Required Braces or Orthoses: Other Brace/Splint (R BK prosthesis) Restrictions Weight Bearing Restrictions: No      Mobility  Bed Mobility Overal bed mobility: Needs Assistance Bed Mobility: Supine to Sit;Sit to Supine     Supine to sit: Mod assist Sit to supine: Mod assist   General bed mobility comments: attempted to slide up side of bed but is too weak  Transfers Overall transfer level: Needs assistance   Transfers: Sit to/from Stand Sit to Stand: Total assist         General transfer comment: unable to assist with standing due to pain and weakness  Ambulation/Gait             General Gait Details: unable, does not have prosthesis and not ambulatory at baseline  Stairs            Wheelchair Mobility    Modified Rankin (Stroke Patients Only)       Balance Overall balance assessment: History of Falls;Needs assistance Sitting-balance  support: Feet supported;Bilateral upper extremity supported Sitting balance-Leahy Scale: Poor                                       Pertinent Vitals/Pain Pain Assessment: Faces Faces Pain Scale: Hurts even more Pain Location: L side with mobility Pain Intervention(s): Limited activity within patient's tolerance;Monitored during session;Premedicated before session;Repositioned    Home Living Family/patient expects to be discharged to:: Skilled nursing facility Living Arrangements: Alone Available Help at Discharge: Friend(s);Available PRN/intermittently           Home Equipment: Walker - 2 wheels;Wheelchair - power      Prior Function Level of Independence: Independent with assistive device(s)         Comments: transfers with no issue bed to chair usually     Hand Dominance        Extremity/Trunk Assessment   Upper Extremity Assessment Upper Extremity Assessment: Generalized weakness    Lower Extremity Assessment Lower Extremity Assessment: Generalized weakness    Cervical / Trunk Assessment Cervical / Trunk Assessment: Kyphotic  Communication   Communication: No difficulties  Cognition Arousal/Alertness: Lethargic Behavior During Therapy: Flat affect Overall Cognitive Status: Within Functional Limits for tasks assessed  General Comments      Exercises General Exercises - Lower Extremity Short Arc Quad: AROM;Left;10 reps Heel Slides: AROM;Both;10 reps Hip ABduction/ADduction: AROM;Both;10 reps   Assessment/Plan    PT Assessment Patient needs continued PT services  PT Problem List Decreased strength;Decreased range of motion;Decreased activity tolerance;Decreased balance;Decreased mobility;Decreased coordination;Decreased cognition;Decreased safety awareness;Cardiopulmonary status limiting activity;Obesity;Decreased skin integrity;Pain       PT Treatment Interventions Functional  mobility training;Therapeutic activities;Therapeutic exercise;Balance training;Neuromuscular re-education;Patient/family education    PT Goals (Current goals can be found in the Care Plan section)  Acute Rehab PT Goals Patient Stated Goal: to feel better and get stronger PT Goal Formulation: With patient Time For Goal Achievement: 06/14/2016 Potential to Achieve Goals: Good    Frequency 7X/week   Barriers to discharge Decreased caregiver support home alone but level environment    Co-evaluation               End of Session   Activity Tolerance: Patient limited by fatigue;Patient limited by pain Patient left: in bed;with call bell/phone within reach (declined to get into chair) Nurse Communication: Mobility status PT Visit Diagnosis: History of falling (Z91.81);Muscle weakness (generalized) (M62.81)    Time: 1009-1016 (plus 1035 to 1114=46 mins) PT Time Calculation (min) (ACUTE ONLY): 7 min   Charges:   PT Evaluation $PT Eval Moderate Complexity: 1 Procedure PT Treatments $Therapeutic Exercise: 8-22 mins $Therapeutic Activity: 8-22 mins   PT G Codes:   PT G-Codes **NOT FOR INPATIENT CLASS** Functional Assessment Tool Used: AM-PAC 6 Clicks Basic Mobility     Ivar Drape 05/30/2016, 11:39 AM   Samul Dada, PT MS Acute Rehab Dept. Number: [facility] [id number] and [facility] [phone]

## 2016-05-30 NOTE — Progress Notes (Signed)
ANTIBIOTIC CONSULT NOTE - FOLLOW UP   Pharmacy Consult for Unasyn  Indication: cellulitis  No Known Allergies  Patient Measurements: Height: 6' (182.9 cm) Weight: 226 lb 13.7 oz (102.9 kg) IBW/kg (Calculated) : 77.6 Adjusted Body Weight:   Vital Signs: Temp: 98.1 F (36.7 C) (04/06 0546) Temp Source: Oral (04/06 0546) BP: 97/51 (04/06 0546) Pulse Rate: 80 (04/06 0546) Intake/Output from previous day: 04/05 0701 - 04/06 0700 In: 2412 [I.V.:2212; IV Piggyback:200] Out: 1625 [Urine:1625] Intake/Output from this shift: No intake/output data recorded.  Labs:  Recent Labs  05/28/16 0458 05/29/16 0426 05/30/16 0408  WBC 17.1* 10.1 8.3  HGB 10.6* 10.3* 9.5*  PLT 211 195 182  CREATININE 0.81 0.88 0.78   Estimated Creatinine Clearance: 130.9 mL/min (by C-G formula based on SCr of 0.78 mg/dL). No results for input(s): VANCOTROUGH, VANCOPEAK, VANCORANDOM, GENTTROUGH, GENTPEAK, GENTRANDOM, TOBRATROUGH, TOBRAPEAK, TOBRARND, AMIKACINPEAK, AMIKACINTROU, AMIKACIN in the last 72 hours.   Microbiology: Recent Results (from the past 720 hour(s))  Urine culture     Status: Abnormal   Collection Time: 05/27/16  1:00 PM  Result Value Ref Range Status   Specimen Description URINE, RANDOM  Final   Special Requests NONE  Final   Culture MULTIPLE SPECIES PRESENT, SUGGEST RECOLLECTION (A)  Final   Report Status 05/29/2016 FINAL  Final  CULTURE, BLOOD (ROUTINE X 2) w Reflex to ID Panel     Status: None (Preliminary result)   Collection Time: 05/27/16  2:40 PM  Result Value Ref Range Status   Specimen Description BLOOD RIGHT ARM  Final   Special Requests Blood Culture adequate volume  Final   Culture NO GROWTH 3 DAYS  Final   Report Status PENDING  Incomplete  CULTURE, BLOOD (ROUTINE X 2) w Reflex to ID Panel     Status: None (Preliminary result)   Collection Time: 05/27/16  2:40 PM  Result Value Ref Range Status   Specimen Description BLOOD RIGHT FOREARM  Final   Special Requests  Blood Culture adequate volume  Final   Culture NO GROWTH 3 DAYS  Final   Report Status PENDING  Incomplete    Medical History: Past Medical History:  Diagnosis Date  . Anemia    iron deficient  . Anxiety   . Chicken pox   . Chronic pain   . Depression   . DM type 2 (diabetes mellitus, type 2) (HCC) 2006   Essentially resolved following bariatric surgery, no meds  . History of malnutrition   . History of sleep apnea   . History of vitamin A deficiency   . HTN (hypertension)    History of; resolved following bariatric surgery  . Sleep apnea    no CPAP after losing weight  . Venous stasis     Medications:  Scheduled:  . ampicillin-sulbactam (UNASYN) IV  3 g Intravenous Q6H  . calcium carbonate  600 mg of elemental calcium Oral BID WC  . collagenase   Topical Daily  . docusate sodium  100 mg Oral BID  . DULoxetine  20 mg Oral Daily  . enoxaparin (LOVENOX) injection  40 mg Subcutaneous Q24H  . ferrous sulfate  325 mg Oral BID  . folic acid  1 mg Oral Daily  . levothyroxine  200 mcg Oral QAC breakfast  . mirtazapine  7.5 mg Oral QHS  . morphine  15 mg Oral Q12H  . multivitamin  15 mL Oral Daily  . pantoprazole  40 mg Oral Daily  . protein supplement shake  11  oz Oral QID  . vitamin C  500 mg Oral Daily   Assessment: CrCl = 116.1 ml/min   Goal of Therapy:  resolution of infection  Plan:  Expected duration 7 days with resolution of temperature and/or normalization of WBC   Will continue Unasyn 3 gm IV Q6H on 4/3 :00.   Rondy Krupinski D 05/30/2016,1:04 PM

## 2016-05-30 NOTE — Progress Notes (Signed)
[employer] - [place] at [facility]   PATIENT NAME: [name]    MR#:  [id number]  DATE OF BIRTH:  10/01/1960  SUBJECTIVE:  CHIEF COMPLAINT:   Chief Complaint  Patient presents with  . Fall  He does not feel he can go home like this.  Still in pain.  Unable to participate with physical therapy.  Sodium and potassium dropped REVIEW OF SYSTEMS:  Review of Systems  Constitutional: Negative for chills, fever and weight loss.  HENT: Negative for nosebleeds and sore throat.   Eyes: Negative for blurred vision.  Respiratory: Negative for cough, shortness of breath and wheezing.   Cardiovascular: Negative for chest pain, orthopnea, leg swelling and PND.  Gastrointestinal: Negative for abdominal pain, constipation, diarrhea, heartburn, nausea and vomiting.  Genitourinary: Negative for dysuria and urgency.  Musculoskeletal: Positive for falls and myalgias. Negative for back pain.  Skin: Negative for rash.  Neurological: Negative for dizziness, speech change, focal weakness and headaches.  Endo/Heme/Allergies: Does not bruise/bleed easily.  Psychiatric/Behavioral: Negative for depression.   DRUG ALLERGIES:  No Known Allergies VITALS:  Blood pressure (!) 98/58, pulse 77, temperature 98 F (36.7 C), temperature source Oral, resp. rate 19, height 6' (1.829 m), weight 102.9 kg (226 lb 13.7 oz), SpO2 98 %. PHYSICAL EXAMINATION:  Physical Exam  Constitutional: He is oriented to person, place, and time and well-developed, well-nourished, and in no distress.  HENT:  Head: Normocephalic and atraumatic.  Eyes: Conjunctivae and EOM are normal. Pupils are equal, round, and reactive to light.  Neck: Normal range of motion. Neck supple. No tracheal deviation present. No thyromegaly present.  Cardiovascular: Normal rate, regular rhythm and normal heart sounds.   Pulmonary/Chest: Effort normal and breath sounds normal. No respiratory distress. He has no wheezes. He exhibits no  tenderness.  Abdominal: Soft. Bowel sounds are normal. He exhibits no distension. There is no tenderness.  Musculoskeletal: Normal range of motion.  Neurological: He is alert and oriented to person, place, and time. No cranial nerve deficit.  Skin: Skin is warm and dry. No rash noted.  Psychiatric: Mood and affect normal.   LABORATORY PANEL:  Female CBC  Recent Labs Lab 05/30/16 0408  WBC 8.3  HGB 9.5*  HCT 28.7*  PLT 182   ------------------------------------------------------------------------------------------------------------------ Chemistries   Recent Labs Lab 05/29/16 0426 05/30/16 0408  NA 129* 134*  K 3.0* 3.4*  CL 102 107  CO2 23 22  GLUCOSE 132* 124*  BUN 14 14  CREATININE 0.88 0.78  CALCIUM 7.2* 7.0*  MG 2.0  2.0  --    RADIOLOGY:  No results found. ASSESSMENT AND PLAN:  [name]  is a 56 y.o. female with a known history of Multiple medical problems including diabetes mellitus, depression, hypertension, sleep apnea, anemia, right BKA presents to hospital after a fall.  * Acute cystitis and chest wall cellulitis-blood cultures Negative and urine cultures growing mixed organism Wound care consult for dressing the chest wall wounds. Likely pressure necrosis from fall. - change antibiotic from Unasyn to oral Keflex  * Hyponatremia: - Na 134 After stopping Celexa  * Fall-secondary to weakness. Physical therapy consulted - unable to participate. Might likely need placement. Stays at home by himself - CK level 828 -> 347  * Hypokalemia - replete and recheck  * depression-continue low-doses of Cymbalta and Remeron  * Chronic pain-continue home pain medications. Patient on MS Contin and breakthrough pain medicines.  * hypothyroidism-continue Synthroid  * DVT prophylaxis-on Lovenox  Physical  therapy recommends rehabilitation placement.  Social worker looking for placement   All the records are reviewed and case discussed with Care  Management/Social Worker. Management plans discussed with the patient, nursing and they are in agreement.  CODE STATUS: Full Code  TOTAL TIME TAKING CARE OF THIS PATIENT: 25 minutes.   More than 50% of the time was spent in counseling/coordination of care: YES  POSSIBLE D/C IN 2-3 DAYS, DEPENDING ON CLINICAL CONDITION.   Delfino Lovett M.D on 05/30/2016 at 5:00 PM  Between 7am to 6pm - Pager - [phone]  After 6pm go to [email or web address] - Social research officer, government  [facility] [place] [facility]  Office  [phone]  CC: Primary care physician; Pcp Not In System  Note: This dictation was prepared with Dragon dictation along with smaller phrase technology. Any transcriptional errors that result from this process are unintentional.

## 2016-05-31 MED ORDER — OXYCODONE HCL 5 MG PO TABS
10.0000 mg | ORAL_TABLET | ORAL | Status: DC | PRN
  Administered 2016-05-31 – 2016-06-02 (×2): 10 mg via ORAL
  Filled 2016-05-31 (×2): qty 2

## 2016-05-31 MED ORDER — IPRATROPIUM-ALBUTEROL 0.5-2.5 (3) MG/3ML IN SOLN
3.0000 mL | Freq: Once | RESPIRATORY_TRACT | Status: AC
  Administered 2016-06-01: 3 mL via RESPIRATORY_TRACT
  Filled 2016-05-31: qty 3

## 2016-05-31 NOTE — Clinical Social Work Placement (Signed)
   CLINICAL SOCIAL WORK PLACEMENT  NOTE  Date:  05/31/2016  Patient Details  Name: [name] MRN: [id number] Date of Birth: 1960/07/21  Clinical Social Work is seeking post-discharge placement for this patient at the Skilled  Nursing Facility level of care (*CSW will initial, date and re-position this form in  chart as items are completed):  Yes   Patient/family provided with [place] Clinical Social Work Department's list of facilities offering this level of care within the geographic area requested by the patient (or if unable, by the patient's family).  Yes   Patient/family informed of their freedom to choose among providers that offer the needed level of care, that participate in Medicare, Medicaid or managed care program needed by the patient, have an available bed and are willing to accept the patient.  Yes   Patient/family informed of [facility]'s ownership interest in [facility] and [facility], as well as of the fact that they are under no obligation to receive care at these facilities.  PASRR submitted to EDS on       PASRR number received on       Existing PASRR number confirmed on 05/31/16     FL2 transmitted to all facilities in geographic area requested by pt/family on 05/31/16     FL2 transmitted to all facilities within larger geographic area on       Patient informed that his/her managed care company has contracts with or will negotiate with certain facilities, including the following:            Patient/family informed of bed offers received.  Patient chooses bed at       Physician recommends and patient chooses bed at      Patient to be transferred to   on  .  Patient to be transferred to facility by       Patient family notified on   of transfer.  Name of family member notified:        PHYSICIAN       Additional Comment:    _______________________________________________ Judi Cong, LCSW 05/31/2016, 2:29 PM

## 2016-05-31 NOTE — NC FL2 (Signed)
[place] MEDICAID FL2 LEVEL OF CARE SCREENING TOOL     IDENTIFICATION  Patient Name: [name] Birthdate: June 13, 1960 Sex: female Admission Date (Current Location): 05/27/2016  [place] and [place] Number:  Chiropodist and Address:  [facility], [address] [place], [place] [address]      Provider Number: [id number]  Attending Physician Name and Address:  Auburn Bilberry, MD  Relative Name and Phone Number:       Current Level of Care: Hospital Recommended Level of Care: Skilled Nursing Facility Prior Approval Number:    Date Approved/Denied:   PASRR Number: [id number]  Discharge Plan: SNF    Current Diagnoses: Patient Active Problem List   Diagnosis Date Noted  . Protein-calorie malnutrition, severe 05/29/2016  . Pressure injury of skin 05/28/2016  . Rib fractures 05/27/2016  . Chronic edema   . Finger fracture, right   . Benign essential HTN   . Chronic pain syndrome   . Venous stasis ulcer (HCC)   . Cellulitis 04/28/2015  . Anemia 04/28/2015  . Weakness generalized 04/28/2015  . Hypokalemia 04/28/2015  . Pressure ulcer of buttock, unstageable (HCC) 04/28/2015  . Scrotal swelling 04/28/2015  . Fall 04/28/2015  . Protein-calorie malnutrition (HCC) 04/28/2015  . Weakness 04/28/2015  . Cellulitis, scrotum   . Failure to thrive in adult   . Fungal dermatitis   . Generalized weakness   . Muscular deconditioning   . Scrotal edema   . Swelling of scrotum   . Hypothyroidism 01/17/2015  . Anemia, iron deficiency 01/17/2015  . Vitamin D deficiency 01/17/2015  . S/P bariatric surgery 01/17/2015  . DM type 2 (diabetes mellitus, type 2) (HCC) 01/17/2015  . Venous stasis ulcer of left lower extremity (HCC) 12/14/2013  . Chronic pain 07/10/2013  . 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 (HCC) 07/09/2013    Orientation RESPIRATION BLADDER Height & Weight     Self, Time, Situation, Place  Normal Continent Weight: 226 lb  13.7 oz (102.9 kg) Height:  6' (182.9 cm)  BEHAVIORAL SYMPTOMS/MOOD NEUROLOGICAL BOWEL NUTRITION STATUS      Continent Supplemental (Protein supplements)  AMBULATORY STATUS COMMUNICATION OF NEEDS Skin   Total Care Verbally Pressure wound                     Personal Care Assistance Level of Assistance  Bathing, Feeding, Dressing Bathing Assistance: Limited assistance Feeding assistance: Independent Dressing Assistance: Limited assistance     Functional Limitations Info             SPECIAL CARE FACTORS FREQUENCY  PT (By licensed PT)     PT Frequency: Up to 5X per day, 5 days per week              Contractures Contractures Info: Present    Additional Factors Info                  Current Medications (05/31/2016):  This is the current hospital active medication list Current Facility-Administered Medications  Medication Dose Route Frequency Provider Last Rate Last Dose  . 0.9 %  sodium chloride infusion   Intravenous Continuous Delfino Lovett, MD 100 mL/hr at 05/31/16 1342    . acetaminophen (TYLENOL) tablet 650 mg  650 mg Oral Q6H PRN Enid Baas, MD       Or  . acetaminophen (TYLENOL) suppository 650 mg  650 mg Rectal Q6H PRN Enid Baas, MD      . Ampicillin-Sulbactam (UNASYN) 3  g in sodium chloride 0.9 % 100 mL IVPB  3 g Intravenous Q6H Vipul Shah, MD   3 g at 05/31/16 0845  . calcium carbonate (TUMS - dosed in mg elemental calcium) chewable tablet 600 mg of elemental calcium  600 mg of elemental calcium Oral BID WC Enid Baas, MD   600 mg of elemental calcium at 05/31/16 0847  . collagenase (SANTYL) ointment   Topical Daily Delfino Lovett, MD      . docusate sodium (COLACE) capsule 100 mg  100 mg Oral BID Enid Baas, MD   100 mg at 05/30/16 0930  . DULoxetine (CYMBALTA) DR capsule 20 mg  20 mg Oral Daily Enid Baas, MD   20 mg at 05/31/16 0846  . enoxaparin (LOVENOX) injection 40 mg  40 mg Subcutaneous Q24H Enid Baas, MD   40  mg at 05/30/16 2201  . ferrous sulfate tablet 325 mg  325 mg Oral BID Enid Baas, MD   325 mg at 05/31/16 0846  . folic acid (FOLVITE) tablet 1 mg  1 mg Oral Daily Enid Baas, MD   1 mg at 05/31/16 0847  . levothyroxine (SYNTHROID, LEVOTHROID) tablet 200 mcg  200 mcg Oral QAC breakfast Enid Baas, MD   200 mcg at 05/31/16 0846  . mirtazapine (REMERON) tablet 7.5 mg  7.5 mg Oral QHS Enid Baas, MD   7.5 mg at 05/30/16 2202  . morphine (MS CONTIN) 12 hr tablet 15 mg  15 mg Oral Q12H Enid Baas, MD   15 mg at 05/31/16 0847  . multivitamin liquid 15 mL  15 mL Oral Daily Delfino Lovett, MD   15 mL at 05/31/16 0847  . oxyCODONE (Oxy IR/ROXICODONE) immediate release tablet 10 mg  10 mg Oral Q4H PRN Auburn Bilberry, MD      . pantoprazole (PROTONIX) EC tablet 40 mg  40 mg Oral Daily Enid Baas, MD   40 mg at 05/31/16 0847  . polyethylene glycol (MIRALAX / GLYCOLAX) packet 17 g  17 g Oral Daily PRN Enid Baas, MD      . protein supplement (PREMIER PROTEIN) liquid  11 oz Oral QID Delfino Lovett, MD   11 oz at 05/30/16 2200  . simethicone (MYLICON) chewable tablet 80 mg  80 mg Oral Q6H PRN Arnaldo Natal, MD   80 mg at 05/29/16 1554  . vitamin C (ASCORBIC ACID) tablet 500 mg  500 mg Oral Daily Enid Baas, MD   500 mg at 05/31/16 1610     Discharge Medications: Please see discharge summary for a list of discharge medications.  Relevant Imaging Results:  Relevant Lab Results:   Additional Information SS# [id number]  Judi Cong, LCSW

## 2016-05-31 NOTE — Progress Notes (Addendum)
Pt noted to have acute onset of expiratory wheezing bilaterally, reports mild dyspnea. States that he began having intermittent wheezing earlier today. Sats 96% on RA, RR 22. No cough, denies CP. No additional acute symptoms. Dr. Anne Hahn made aware, new orders received, will monitor closely.  0045-Wheezing resolved with duoneb. Pt reports feeling better.

## 2016-05-31 NOTE — Clinical Social Work Note (Signed)
Clinical Social Work Assessment  Patient Details  Name: [name] MRN: [id number] Date of Birth: Apr 26, 1960  Date of referral:  05/31/16               Reason for consult:  Facility Placement                Permission sought to share information with:  Facility Art therapist granted to share information::  Yes, Verbal Permission Granted  Name::        Agency::     Relationship::     Contact Information:     Housing/Transportation Living arrangements for the past 2 months:  Apartment Source of Information:  Patient Patient Interpreter Needed:  None Criminal Activity/Legal Involvement Pertinent to Current Situation/Hospitalization:  No - Comment as needed Significant Relationships:  Siblings Lives with:  Self Do you feel safe going back to the place where you live?  Yes Need for family participation in patient care:  No (Coment)  Care giving concerns:  PT recommendation for STR   Social Worker assessment / plan:  CSW met with patient at bedside to discuss dc planning. The patient gave verbal permission to conduct CSW referral for SNF and reported that his preference is [facility].  The patient at baseline lives alone and is WC bound. The patient indicated that he has a great deal of support from his sisters. The patient is typically continent and able to address his ADLs without assistance.  [facility] extended a bed offer, and the patient has accepted it. CSW is attempting to facilitate a dc as early as 4/8 if they can accept. If not, he can be accepted on 4/8.  Employment status:  Retired Surveyor, [employer] Care PT Recommendations:  [place] / Referral to community resources:  [place]  Patient/Family's Response to care:  Patient thanked CSW for assistance.  Patient/Family's Understanding of and Emotional Response to Diagnosis, Current Treatment, and Prognosis:  Patient is in agreement with  dc plan.  Emotional Assessment Appearance:  Appears stated age Attitude/Demeanor/Rapport:  Lethargic Affect (typically observed):  Accepting, Appropriate, Pleasant Orientation:  Oriented to Self, Oriented to Place, Oriented to  Time, Oriented to Situation Alcohol / Substance use:  Never Used Psych involvement (Current and /or in the community):  No (Comment)  Discharge Needs  Concerns to be addressed:  Discharge Planning Concerns, Care Coordination Readmission within the last 30 days:  No Current discharge risk:  Chronically ill Barriers to Discharge:  Continued Medical Work up   [employer], LCSW 05/31/2016, 2:26 PM

## 2016-05-31 NOTE — Clinical Social Work Note (Signed)
CSW received notification from [facility] Admissions on call Rene Kocher that this patient can be received on 06/01/16 with no problem. CSW will con't to follow for dc facilitation.  Argentina Ponder, MSW, [place]ia Majors [phone]

## 2016-05-31 NOTE — Progress Notes (Signed)
[employer] - [place] at [facility]   PATIENT NAME: [name]    MR#:  [id number]  DATE OF BIRTH:  06/23/1960  SUBJECTIVE:  CHIEF COMPLAINT:   Chief Complaint  Patient presents with  . Fall  He still has pain in his ribs. States that he was taking long-acting narcotics as well as short acting narcotics and would like his regimen to be adjusted. REVIEW OF SYSTEMS:  Review of Systems  Constitutional: Negative for chills, fever and weight loss.  HENT: Negative for nosebleeds and sore throat.   Eyes: Negative for blurred vision.  Respiratory: Negative for cough, shortness of breath and wheezing.   Cardiovascular: Negative for chest pain, orthopnea, leg swelling and PND.  Gastrointestinal: Negative for abdominal pain, constipation, diarrhea, heartburn, nausea and vomiting.  Genitourinary: Negative for dysuria and urgency.  Musculoskeletal: Positive for falls and myalgias. Negative for back pain.  Skin: Negative for rash.  Neurological: Negative for dizziness, speech change, focal weakness and headaches.  Endo/Heme/Allergies: Does not bruise/bleed easily.  Psychiatric/Behavioral: Negative for depression.   DRUG ALLERGIES:  No Known Allergies VITALS:  Blood pressure (!) 97/51, pulse 83, temperature 98.2 F (36.8 C), temperature source Oral, resp. rate 17, height 6' (1.829 m), weight 226 lb 13.7 oz (102.9 kg), SpO2 97 %. PHYSICAL EXAMINATION:  Physical Exam  Constitutional: He is oriented to person, place, and time and well-developed, well-nourished, and in no distress.  HENT:  Head: Normocephalic and atraumatic.  Eyes: Conjunctivae and EOM are normal. Pupils are equal, round, and reactive to light.  Neck: Normal range of motion. Neck supple. No tracheal deviation present. No thyromegaly present.  Cardiovascular: Normal rate, regular rhythm and normal heart sounds.   Pulmonary/Chest: Effort normal and breath sounds normal. No respiratory distress. He has no  wheezes. He exhibits no tenderness.  Abdominal: Soft. Bowel sounds are normal. He exhibits no distension. There is no tenderness.  Musculoskeletal: Normal range of motion.  Neurological: He is alert and oriented to person, place, and time. No cranial nerve deficit.  Skin: Skin is warm and dry. No rash noted.  Psychiatric: Mood and affect normal.   LABORATORY PANEL:  Female CBC  Recent Labs Lab 05/31/16 0444  WBC 9.1  HGB 8.9*  HCT 26.8*  PLT 203   ------------------------------------------------------------------------------------------------------------------ Chemistries   Recent Labs Lab 05/29/16 0426  05/31/16 0444  NA 129*  < > 136  K 3.0*  < > 3.3*  CL 102  < > 112*  CO2 23  < > 21*  GLUCOSE 132*  < > 106*  BUN 14  < > 9  CREATININE 0.88  < > 0.72  CALCIUM 7.2*  < > 6.8*  MG 2.0  2.0  --   --   < > = values in this interval not displayed. RADIOLOGY:  No results found. ASSESSMENT AND PLAN:  [name]  is a 56 y.o. female with a known history of Multiple medical problems including diabetes mellitus, depression, hypertension, sleep apnea, anemia, right BKA presents to hospital after a fall.  * Acute cystitis and chest wall cellulitis-blood cultures Negative and urine cultures growing mixed organism Wound care consult for dressing the chest wall wounds. Likely pressure necrosis from fall. Continue oral keflex  * Hyponatremia: - Na 134 After stopping Celexa stable  * Fall-secondary to weakness. Physical therapy consulted - unable to participate.  snf placment   * Hypokalemia - still little low  * depression-continue low-doses of Cymbalta and Remeron  * Chronic  pain-continue home pain medications. Patient on MS Contin and breakthrough pain medicines.  * hypothyroidism-continue Synthroid  * DVT prophylaxis-on Lovenox  Physical therapy recommends rehabilitation placement.  Social worker looking for placement   All the records are reviewed and case  discussed with Care Management/Social Worker. Management plans discussed with the patient, nursing and they are in agreement.  CODE STATUS: Full Code  TOTAL TIME TAKING CARE OF THIS PATIENT: 25 minutes.   More than 50% of the time was spent in counseling/coordination of care: YES  POSSIBLE D/C IN 2-3 DAYS, DEPENDING ON CLINICAL CONDITION.   Auburn Bilberry M.D on 05/31/2016 at 12:59 PM  Between 7am to 6pm - Pager - [phone]  After 6pm go to [email or web address] - Social research officer, government  [facility] [place] [facility]  Office  [phone]  CC: Primary care physician; Pcp Not In System  Note: This dictation was prepared with Dragon dictation along with smaller phrase technology. Any transcriptional errors that result from this process are unintentional.

## 2016-06-01 ENCOUNTER — Inpatient Hospital Stay: Payer: 59

## 2016-06-01 LAB — CULTURE, BLOOD (ROUTINE X 2)
CULTURE: NO GROWTH
CULTURE: NO GROWTH
SPECIAL REQUESTS: ADEQUATE
Special Requests: ADEQUATE

## 2016-06-01 MED ORDER — AMOXICILLIN-POT CLAVULANATE 875-125 MG PO TABS
1.0000 | ORAL_TABLET | Freq: Two times a day (BID) | ORAL | Status: DC
  Administered 2016-06-01 – 2016-06-02 (×3): 1 via ORAL
  Filled 2016-06-01 (×3): qty 1

## 2016-06-01 MED ORDER — OXYCODONE-ACETAMINOPHEN 5-325 MG PO TABS: 1.0000 | ORAL_TABLET | ORAL | 0 refills | Status: AC | PRN

## 2016-06-01 MED ORDER — POLYETHYLENE GLYCOL 3350 17 G PO PACK: 17.0000 g | PACK | Freq: Every day | ORAL | 0 refills | Status: AC | PRN

## 2016-06-01 MED ORDER — AMOXICILLIN-POT CLAVULANATE 875-125 MG PO TABS: 1.0000 | ORAL_TABLET | Freq: Two times a day (BID) | ORAL | 0 refills | Status: AC

## 2016-06-01 MED ORDER — PREMIER PROTEIN SHAKE: 11.0000 [oz_av] | Freq: Four times a day (QID) | ORAL | 0 refills | Status: AC

## 2016-06-01 MED ORDER — SIMETHICONE 80 MG PO CHEW: 80.0000 mg | CHEWABLE_TABLET | Freq: Four times a day (QID) | ORAL | 0 refills | Status: AC | PRN

## 2016-06-01 MED ORDER — COLLAGENASE 250 UNIT/GM EX OINT: TOPICAL_OINTMENT | Freq: Every day | CUTANEOUS | 0 refills | Status: AC

## 2016-06-01 MED ORDER — MORPHINE SULFATE ER 15 MG PO TBCR: 15.0000 mg | EXTENDED_RELEASE_TABLET | Freq: Two times a day (BID) | ORAL | 0 refills | Status: AC

## 2016-06-01 NOTE — Clinical Social Work Note (Signed)
CSW received call from PT alerting that the patient's sisters are wanting to discuss dc plan. The CSW attempted to call each sister and left VM for each sister. Patient will not be able to dc today due to needing bed offers since [facility] rescinded. CSW will con't to follow  Argentina Ponder, MSW, [place] [phone]

## 2016-06-01 NOTE — Discharge Summary (Signed)
[facility] - [facility], 56 y.o., DOB January 26, 1961, MRN [id number]. Admission date: 05/27/2016 Discharge Date 06/01/2016 Primary MD Pcp Not In System Admitting Physician Enid Baas, MD  Admission Diagnosis  Closed fracture of multiple ribs of right side, initial encounter [S22.41XA] Closed fracture of multiple ribs of left side, initial encounter [S22.42XA] Decubitus ulcer of left upper back, unspecified ulcer stage [L89.129]  Discharge Diagnosis   Active Problems: Acute cystitis Chest wall cellultis  Rib fractures multiple  Pressure injury of skin Protein-calorie malnutrition, severe Hyponatremia Fall secondary to weakness Hypokalemia Depression Chronic pain Hypothyroidism Status post Right BKA        Hospital Course  [name]  is a 56 y.o. female with a known history of Multiple medical problems including diabetes mellitus, depression, hypertension, sleep apnea, anemia, right BKA presents to hospital after a fall. Patient is a very poor historian. Due to his BKA, he is usually wheelchair bound at baseline. He said he slipped from his chair yesterday and fell between his bed and chair and was unable to get up. He spent almost 12 hours on the floor. Patient was noted to have rib fractures on bilateral ribs 7-11. Patient also was noted to have chest wall cellulitis and was treated with antibiotics. He will be treated oral antibiotics. Pt was also seen by wound care and recommended wound care change. Pt very weak and needed rehab and snf.              Consults  None  Significant Tests:  See full reports for all details     Dg Ribs Unilateral W/chest Left  Result Date: 05/27/2016 CLINICAL DATA:  Larey Seat out of chair last night. Unable to stand. Left-sided chest pain. EXAM: LEFT RIBS AND CHEST - 3+ VIEW COMPARISON:  04/16/2014 FINDINGS: Heart size is normal. The aorta shows atherosclerosis. There is mild scarring in both lower  lungs. Left rib details show multiple nondisplaced rib fractures in the lower thoracic region, probably affecting ribs 7 through 11. No pneumothorax. Chest radiography appears to show recent rib fractures also on the right in the lower posterolateral ribs. IMPRESSION: No active cardiopulmonary disease. Likely bilateral acute posterolateral rib fractures in the lower ribcage bilaterally, ribs 7 through 11 bilaterally. No pneumothorax. Electronically Signed   By: Paulina Fusi M.D.   On: 05/27/2016 10:24   Dg Chest Port 1 View  Result Date: 06/01/2016 CLINICAL DATA:  Wheezing EXAM: PORTABLE CHEST 1 VIEW COMPARISON:  05/27/2016 FINDINGS: Cardiomegaly with aortic atherosclerosis. Subsegmental atelectasis and/or scarring at each lung base. Mild bronchitic change of the lungs. No pneumonic consolidation, effusion or pneumothorax. No acute osseous abnormality. IMPRESSION: Stable cardiomegaly with bibasilar atelectasis and/or scarring. Mild interstitial prominence consistent with bronchitic change. No alveolar consolidation or CHF. Aortic atherosclerosis. Electronically Signed   By: Tollie Eth M.D.   On: 06/01/2016 00:49       Today   Subjective:   [name] feels ok pain in control   Objective:   Blood pressure 113/67, pulse 83, temperature 98.1 F (36.7 C), temperature source Oral, resp. rate 20, height 6' (1.829 m), weight 234 lb (106.1 kg), SpO2 96 %.  .  Intake/Output Summary (Last 24 hours) at 06/01/16 1030 Last data filed at 06/01/16 0800  Gross per 24 hour  Intake             1045 ml  Output              150 ml  Net              895 ml    Exam VITAL SIGNS: Blood pressure 113/67, pulse 83, temperature 98.1 F (36.7 C), temperature source Oral, resp. rate 20, height 6' (1.829 m), weight 234 lb (106.1 kg), SpO2 96 %.  GENERAL:  56 y.o.-year-old patient lying in the bed with no acute distress.  EYES: Pupils equal, round, reactive to light and accommodation. No scleral icterus.  Extraocular muscles intact.  HEENT: Head atraumatic, normocephalic. Oropharynx and nasopharynx clear.  NECK:  Supple, no jugular venous distention. No thyroid enlargement, no tenderness.  LUNGS: Normal breath sounds bilaterally, no wheezing, rales,rhonchi or crepitation. No use of accessory muscles of respiration.  CARDIOVASCULAR: S1, S2 normal. No murmurs, rubs, or gallops.  ABDOMEN: Soft, nontender, nondistended. Bowel sounds present. No organomegaly or mass.  EXTREMITIES: No pedal edema, cyanosis, or clubbing.  NEUROLOGIC: Cranial nerves II through XII are intact. Muscle strength 5/5 in all extremities. Sensation intact. Gait not checked.  PSYCHIATRIC: The patient is alert and oriented x 3.  SKIN: No obvious rash, lesion, or ulcer.   Data Review     CBC w Diff: Lab Results  Component Value Date   WBC 9.1 05/31/2016   HGB 8.9 (L) 05/31/2016   HGB 8.2 (L) 04/03/2014   HCT 26.8 (L) 05/31/2016   HCT 26.2 (L) 04/03/2014   HCT 29.0 (L) 03/08/2014   PLT 203 05/31/2016   PLT 271 04/03/2014   LYMPHOPCT 2 05/27/2016   LYMPHOPCT 14.7 04/03/2014   MONOPCT 5 05/27/2016   MONOPCT 12.8 04/03/2014   EOSPCT 0 05/27/2016   EOSPCT 2.1 04/03/2014   BASOPCT 0 05/27/2016   BASOPCT 0.5 04/03/2014   CMP: Lab Results  Component Value Date   NA 136 05/31/2016   NA 141 05/23/2015   NA 136 04/03/2014   K 3.3 (L) 05/31/2016   K 3.7 04/03/2014   CL 112 (H) 05/31/2016   CL 101 04/03/2014   CO2 21 (L) 05/31/2016   CO2 27 04/03/2014   BUN 9 05/31/2016   BUN 15 05/23/2015   BUN 39 (H) 04/03/2014   CREATININE 0.72 05/31/2016   CREATININE 0.96 04/03/2014   GLU 60 05/23/2015   PROT 6.4 (L) 05/15/2015   PROT 7.0 04/03/2014   ALBUMIN 2.3 (L) 05/15/2015   ALBUMIN 2.0 (L) 04/03/2014   BILITOT 0.6 05/15/2015   BILITOT 0.3 04/03/2014   ALKPHOS 4.13 (A) 05/23/2015   ALKPHOS 135 (H) 04/03/2014   AST 28 05/23/2015   AST 38 (H) 04/03/2014   ALT 19 05/23/2015   ALT 30 04/03/2014  .  Micro  Results Recent Results (from the past 240 hour(s))  Urine culture     Status: Abnormal   Collection Time: 05/27/16  1:00 PM  Result Value Ref Range Status   Specimen Description URINE, RANDOM  Final   Special Requests NONE  Final   Culture MULTIPLE SPECIES PRESENT, SUGGEST RECOLLECTION (A)  Final   Report Status 05/29/2016 FINAL  Final  CULTURE, BLOOD (ROUTINE X 2) w Reflex to ID Panel     Status: None   Collection Time: 05/27/16  2:40 PM  Result Value Ref Range Status   Specimen Description BLOOD RIGHT ARM  Final   Special Requests Blood Culture adequate volume  Final   Culture NO GROWTH 5 DAYS  Final   Report Status 06/01/2016 FINAL  Final  CULTURE, BLOOD (ROUTINE X 2) w Reflex to ID Panel     Status: None  Collection Time: 05/27/16  2:40 PM  Result Value Ref Range Status   Specimen Description BLOOD RIGHT FOREARM  Final   Special Requests Blood Culture adequate volume  Final   Culture NO GROWTH 5 DAYS  Final   Report Status 06/01/2016 FINAL  Final        Code Status Orders        Start     Ordered   05/27/16 2039  Full code  Continuous     05/27/16 2038    Code Status History    Date Active Date Inactive Code Status Order ID Comments User Context   04/28/2015  4:32 AM 05/02/2015 12:29 AM Full Code [id number]  Rodolph Bong, MD Inpatient   03/07/2014  9:05 PM 03/27/2014 10:36 PM Full Code [id number]  Barth Kirks Inpatient            Discharge Medications   Allergies as of 06/01/2016   No Known Allergies     Medication List    TAKE these medications   acetaminophen 325 MG tablet Commonly known as:  TYLENOL Take 2 tablets (650 mg total) by mouth every 6 (six) hours as needed for mild pain (or Fever >/= 101).   amoxicillin-clavulanate 875-125 MG tablet Commonly known as:  AUGMENTIN Take 1 tablet by mouth every 12 (twelve) hours.   calcium carbonate 1500 (600 Ca) MG Tabs tablet Commonly known as:  OSCAL Take 600 mg of elemental calcium by mouth 2  (two) times daily with a meal.   citalopram 10 MG tablet Commonly known as:  CELEXA Take 10 mg by mouth daily. For depression   collagenase ointment Commonly known as:  SANTYL Apply topically daily.   DECUBI-VITE PO Take 1 tablet by mouth daily.   DULoxetine 20 MG capsule Commonly known as:  CYMBALTA Take 20 mg by mouth daily.   ergocalciferol 50000 units capsule Commonly known as:  VITAMIN D2 Take 50,000 Units by mouth once a week.   feeding supplement (PRO-STAT SUGAR FREE 64) Liqd Take 30 mLs by mouth 2 (two) times daily with a meal. Stop date 06/04/15   ferrous sulfate 325 (65 FE) MG tablet Take 325 mg by mouth 2 (two) times daily.   folic acid 1 MG tablet Commonly known as:  FOLVITE Take 1 mg by mouth daily.   levothyroxine 200 MCG tablet Commonly known as:  SYNTHROID, LEVOTHROID TAKE ONE TABLET BY MOUTH DAILY   mirtazapine 7.5 MG tablet Commonly known as:  REMERON Take 7.5 mg by mouth at bedtime. For insomnia. Take (1/2 tablet)   morphine 15 MG 12 hr tablet Commonly known as:  MS CONTIN Take 1 tablet (15 mg total) by mouth every 12 (twelve) hours. For pain   oxyCODONE-acetaminophen 5-325 MG tablet Commonly known as:  PERCOCET/ROXICET Take 1 tablet by mouth every 4 (four) hours as needed for moderate pain or severe pain (Take 2 tablets every 4 hours as needed for severe pain).   pantoprazole 40 MG tablet Commonly known as:  PROTONIX Take 40 mg by mouth daily. For GERD   polyethylene glycol packet Commonly known as:  MIRALAX / GLYCOLAX Take 17 g by mouth daily as needed for mild constipation.   potassium chloride SA 20 MEQ tablet Commonly known as:  K-DUR,KLOR-CON Take 20 mEq by mouth daily.   Pramoxine-Calamine 1-3 % Crea Commonly known as:  AVEENO ANTI-ITCH Apply 1 Bottle topically every 8 (eight) hours.   protein supplement shake Liqd Commonly known as:  PREMIER PROTEIN Take  325 mLs (11 oz total) by mouth 4 (four) times daily.   simethicone 80  MG chewable tablet Commonly known as:  MYLICON Chew 1 tablet (80 mg total) by mouth every 6 (six) hours as needed for flatulence.   TORSEMIDE PO Take 30 mg by mouth 2 (two) times daily. Hold for sbp < 110   vitamin C 500 MG tablet Commonly known as:  ASCORBIC ACID Take 500 mg by mouth daily. FOR 30 DAYS   STOP DATE 06/04/15          Total Time in preparing paper work, data evaluation and todays exam - 35 minutes  Auburn Bilberry M.D on 06/01/2016 at 10:30 AM  [facility]   Office  [phone]

## 2016-06-01 NOTE — Clinical Social Work Note (Addendum)
CSW received alert from [facility] that they cannot receive the patient today and have rescinded the offer due to past difficulties with the patient. CSW continuing to follow and will present other bed offers.  Argentina Ponder, MSW, Theresia Majors [phone]

## 2016-06-01 NOTE — Progress Notes (Signed)
   05/31/16 1500  PT Visit Information  Last PT Received On 05/31/16  Assistance Needed +2  Reason Eval/Treat Not Completed Other (comment) (Multiple attempts to get pt up who has refused.)  Restrictions  Weight Bearing Restrictions No  Samul Dada, PT MS Acute Rehab Dept. Number: [facility] [id number] and [facility] [phone]

## 2016-06-01 NOTE — Progress Notes (Signed)
PT Cancellation Note  Patient Details Name: [name] MRN: [id number] DOB: 13-Jun-1960   Cancelled Treatment:    Reason Eval/Treat Not Completed: Fatigue/lethargy limiting ability to participate;Other (comment). Pt states it is too early to get out of bed, to come back later.  Will reattempt as pt and time allows.   Ivar Drape 06/01/2016, 10:14 AM   Samul Dada, PT MS Acute Rehab Dept. Number: [facility] [id number] and [facility] [phone]

## 2016-06-01 NOTE — Progress Notes (Signed)
[facility] - [place] at [facility]   PATIENT NAME: Kambry Takacs    MR#:  [id number]  DATE OF BIRTH:  [birth year]  SUBJECTIVE:  CHIEF COMPLAINT:   Chief Complaint  Patient presents with  . Fall  Patient's pain under control denies any other significant complaints   REVIEW OF SYSTEMS:  Review of Systems  Constitutional: Negative for chills, fever and weight loss.  HENT: Negative for nosebleeds and sore throat.   Eyes: Negative for blurred vision.  Respiratory: Negative for cough, shortness of breath and wheezing.   Cardiovascular: Negative for chest pain, orthopnea, leg swelling and PND.  Gastrointestinal: Negative for abdominal pain, constipation, diarrhea, heartburn, nausea and vomiting.  Genitourinary: Negative for dysuria and urgency.  Musculoskeletal: Positive for falls and myalgias. Negative for back pain.  Skin: Negative for rash.  Neurological: Negative for dizziness, speech change, focal weakness and headaches.  Endo/Heme/Allergies: Does not bruise/bleed easily.  Psychiatric/Behavioral: Negative for depression.   DRUG ALLERGIES:  No Known Allergies VITALS:  Blood pressure 103/63, pulse 87, temperature 98.5 F (36.9 C), temperature source Oral, resp. rate 20, height 6' (1.829 m), weight 234 lb (106.1 kg), SpO2 98 %. PHYSICAL EXAMINATION:  Physical Exam  Constitutional: He is oriented to person, place, and time and well-developed, well-nourished, and in no distress.  HENT:  Head: Normocephalic and atraumatic.  Eyes: Conjunctivae and EOM are normal. Pupils are equal, round, and reactive to light.  Neck: Normal range of motion. Neck supple. No tracheal deviation present. No thyromegaly present.  Cardiovascular: Normal rate, regular rhythm and normal heart sounds.   Pulmonary/Chest: Effort normal and breath sounds normal. No respiratory distress. He has no wheezes. He exhibits no tenderness.  Abdominal: Soft. Bowel sounds are normal. He exhibits no  distension. There is no tenderness.  Musculoskeletal: Normal range of motion.  Neurological: He is alert and oriented to person, place, and time. No cranial nerve deficit.  Skin: Skin is warm and dry. No rash noted.  Psychiatric: Mood and affect normal.   LABORATORY PANEL:  Female CBC  Recent Labs Lab 05/31/16 0444  WBC 9.1  HGB 8.9*  HCT 26.8*  PLT 203   ------------------------------------------------------------------------------------------------------------------ Chemistries   Recent Labs Lab 05/29/16 0426  05/31/16 0444  NA 129*  < > 136  K 3.0*  < > 3.3*  CL 102  < > 112*  CO2 23  < > 21*  GLUCOSE 132*  < > 106*  BUN 14  < > 9  CREATININE 0.88  < > 0.72  CALCIUM 7.2*  < > 6.8*  MG 2.0  2.0  --   --   < > = values in this interval not displayed. RADIOLOGY:  Dg Chest Port 1 View  Result Date: 06/01/2016 CLINICAL DATA:  Wheezing EXAM: PORTABLE CHEST 1 VIEW COMPARISON:  05/27/2016 FINDINGS: Cardiomegaly with aortic atherosclerosis. Subsegmental atelectasis and/or scarring at each lung base. Mild bronchitic change of the lungs. No pneumonic consolidation, effusion or pneumothorax. No acute osseous abnormality. IMPRESSION: Stable cardiomegaly with bibasilar atelectasis and/or scarring. Mild interstitial prominence consistent with bronchitic change. No alveolar consolidation or CHF. Aortic atherosclerosis. Electronically Signed   By: Tollie Eth M.D.   On: 06/01/2016 00:49   ASSESSMENT AND PLAN:  Estelle Greenleaf  is a 56 y.o. female with a known history of Multiple medical problems including diabetes mellitus, depression, hypertension, sleep apnea, anemia, right BKA presents to hospital after a fall.  * Acute cystitis and chest wall cellulitis-blood cultures Negative and urine  cultures growing mixed organism Wound care consult for dressing the chest wall wounds. Likely pressure necrosis from fall. Oral augmetin  * Hyponatremia: Sodium is stable  * Fall-secondary to  weakness. Physical therapy consulted - unable to participate.  snf placment   * Hypokalemia Recheck in the morning * depression-continue low-doses of Cymbalta and Remeron  * Chronic pain-continue home pain medications. Patient on MS Contin and breakthrough pain medicines.  * hypothyroidism-continue Synthroid  * DVT prophylaxis-on Lovenox  Physical therapy recommends rehabilitation placement.    Apparently patient had a bed up till this morning now the facility states that they will not be able to take the patient so he will need another facility social worker looking for another facility for him   All the records are reviewed and case discussed with Care Management/Social Worker. Management plans discussed with the patient, nursing and they are in agreement.  CODE STATUS: Full Code  TOTAL TIME TAKING CARE OF THIS PATIENT: 25 minutes.   More than 50% of the time was spent in counseling/coordination of care: YES  POSSIBLE D/C IN 2-3 DAYS, DEPENDING ON CLINICAL CONDITION.   Auburn Bilberry M.D on 06/01/2016 at 1:19 PM  Between 7am to 6pm - Pager - [phone]  After 6pm go to [email or web address] - Social research officer, government  [facility] [place] [facility]  Office  [phone]  CC: Primary care physician; Pcp Not In System  Note: This dictation was prepared with Dragon dictation along with smaller phrase technology. Any transcriptional errors that result from this process are unintentional.

## 2016-06-01 NOTE — Discharge Instructions (Signed)
[facility] - [place] at [facility]  DIET:  Diabetic diet  DISCHARGE CONDITION:  Stable  ACTIVITY:  Activity as tolerated  OXYGEN:  Home Oxygen: No.   Oxygen Delivery: room air  DISCHARGE LOCATION:  home    ADDITIONAL DISCHARGE INSTRUCTION: Wound care:  Cleanse wound to left lateral chest wall with NS and pat gently dry.  Apply Santyl to wound bed. Cover with NS moist gauze.  Secure with ABD pad and tape.  Change daily    If you experience worsening of your admission symptoms, develop shortness of breath, life threatening emergency, suicidal or homicidal thoughts you must seek medical attention immediately by calling 911 or calling your MD immediately  if symptoms less severe.  You Must read complete instructions/literature along with all the possible adverse reactions/side effects for all the Medicines you take and that have been prescribed to you. Take any new Medicines after you have completely understood and accpet all the possible adverse reactions/side effects.   Please note  You were cared for by a hospitalist during your hospital stay. If you have any questions about your discharge medications or the care you received while you were in the hospital after you are discharged, you can call the unit and asked to speak with the hospitalist on call if the hospitalist that took care of you is not available. Once you are discharged, your primary care physician will handle any further medical issues. Please note that NO REFILLS for any discharge medications will be authorized once you are discharged, as it is imperative that you return to your primary care physician (or establish a relationship with a primary care physician if you do not have one) for your aftercare needs so that they can reassess your need for medications and monitor your lab values.

## 2016-06-02 MED ORDER — IPRATROPIUM-ALBUTEROL 0.5-2.5 (3) MG/3ML IN SOLN: 3.0000 mL | Freq: Four times a day (QID) | RESPIRATORY_TRACT | 0 refills | Status: AC | PRN

## 2016-06-02 MED ORDER — IPRATROPIUM-ALBUTEROL 0.5-2.5 (3) MG/3ML IN SOLN: 3.0000 mL | Freq: Four times a day (QID) | RESPIRATORY_TRACT | Status: DC | PRN

## 2016-06-02 NOTE — Progress Notes (Signed)
Spoke with Algis Greenhouse, [employer] rep at [phone], to notify of non-emergent EMS transport.  Auth notification reference given as [id number].   Service date range good for 3 months starting with today's date (06/02/16).

## 2016-06-02 NOTE — Progress Notes (Signed)
PT Cancellation Note  Patient Details Name: [name] MRN: [id number] DOB: 11-10-1960   Cancelled Treatment:    Reason Eval/Treat Not Completed: Other (comment). Treatment attempted initially and pt was busy with nursing staff; second attempt, pt refuses noting he is being discharged to skilled nursing facility today.    Scot Dock, PTA 06/02/2016, 12:18 PM

## 2016-06-02 NOTE — Progress Notes (Signed)
Patient A&O.  VSS.  Pain controlled on scheduled meds.  Dressings C/D/I.  Tolerating diet well.  Report called to RN at [facility].  EMS called for pickup.

## 2016-06-02 NOTE — Discharge Summary (Signed)
[facility], 56 y.o., DOB Jan 08, 1961, MRN [id number]. Admission date: 05/27/2016 Discharge Date 06/02/2016 Primary MD Pcp Not In System Admitting Physician Enid Baas, MD  Admission Diagnosis  Closed fracture of multiple ribs of right side, initial encounter [S22.41XA] Closed fracture of multiple ribs of left side, initial encounter [S22.42XA] Decubitus ulcer of left upper back, unspecified ulcer stage [L89.129]  Discharge Diagnosis   Active Problems: Acute cystitis Chest wall cellultis  Rib fractures multiple  Pressure injury of skin Protein-calorie malnutrition, severe Hyponatremia Fall secondary to weakness Hypokalemia Depression Chronic pain Hypothyroidism Status post Right BKA        Hospital Course  [name]  is a 56 y.o. female with a known history of Multiple medical problems including diabetes mellitus, depression, hypertension, sleep apnea, anemia, right BKA presents to hospital after a fall. Patient is a very poor historian. Due to his BKA, he is usually wheelchair bound at baseline. He said he slipped from his chair yesterday and fell between his bed and chair and was unable to get up. He spent almost 12 hours on the floor. Patient was noted to have rib fractures on bilateral ribs 7-11. Patient also was noted to have chest wall cellulitis and was treated with antibiotics. He will be treated oral antibiotics. Pt was also seen by wound care and recommended wound care change. Pt very weak and needed rehab and snf.              Consults  None  Significant Tests:  See full reports for all details     Dg Ribs Unilateral W/chest Left  Result Date: 05/27/2016 CLINICAL DATA:  Larey Seat out of chair last night. Unable to stand. Left-sided chest pain. EXAM: LEFT RIBS AND CHEST - 3+ VIEW COMPARISON:  04/16/2014 FINDINGS: Heart size is normal. The aorta shows atherosclerosis. There is mild scarring in both lower  lungs. Left rib details show multiple nondisplaced rib fractures in the lower thoracic region, probably affecting ribs 7 through 11. No pneumothorax. Chest radiography appears to show recent rib fractures also on the right in the lower posterolateral ribs. IMPRESSION: No active cardiopulmonary disease. Likely bilateral acute posterolateral rib fractures in the lower ribcage bilaterally, ribs 7 through 11 bilaterally. No pneumothorax. Electronically Signed   By: Paulina Fusi M.D.   On: 05/27/2016 10:24   Dg Chest Port 1 View  Result Date: 06/01/2016 CLINICAL DATA:  Wheezing EXAM: PORTABLE CHEST 1 VIEW COMPARISON:  05/27/2016 FINDINGS: Cardiomegaly with aortic atherosclerosis. Subsegmental atelectasis and/or scarring at each lung base. Mild bronchitic change of the lungs. No pneumonic consolidation, effusion or pneumothorax. No acute osseous abnormality. IMPRESSION: Stable cardiomegaly with bibasilar atelectasis and/or scarring. Mild interstitial prominence consistent with bronchitic change. No alveolar consolidation or CHF. Aortic atherosclerosis. Electronically Signed   By: Tollie Eth M.D.   On: 06/01/2016 00:49       Today   Subjective:   [name] feels ok pain in control   Objective:   Blood pressure 120/73, pulse 84, temperature 98 F (36.7 C), temperature source Oral, resp. rate 20, height 6' (1.829 m), weight 233 lb (105.7 kg), SpO2 94 %.  .  Intake/Output Summary (Last 24 hours) at 06/02/16 1304 Last data filed at 06/02/16 0930  Gross per 24 hour  Intake              360 ml  Output  0 ml  Net              360 ml    Exam VITAL SIGNS: Blood pressure 120/73, pulse 84, temperature 98 F (36.7 C), temperature source Oral, resp. rate 20, height 6' (1.829 m), weight 233 lb (105.7 kg), SpO2 94 %.  GENERAL:  56 y.o.-year-old patient lying in the bed with no acute distress.  EYES: Pupils equal, round, reactive to light and accommodation. No scleral icterus. Extraocular  muscles intact.  HEENT: Head atraumatic, normocephalic. Oropharynx and nasopharynx clear.  NECK:  Supple, no jugular venous distention. No thyroid enlargement, no tenderness.  LUNGS: Normal breath sounds bilaterally, no wheezing, rales,rhonchi or crepitation. No use of accessory muscles of respiration.  CARDIOVASCULAR: S1, S2 normal. No murmurs, rubs, or gallops.  ABDOMEN: Soft, nontender, nondistended. Bowel sounds present. No organomegaly or mass.  EXTREMITIES: No pedal edema, cyanosis, or clubbing.  NEUROLOGIC: Cranial nerves II through XII are intact. Muscle strength 5/5 in all extremities. Sensation intact. Gait not checked.  PSYCHIATRIC: The patient is alert and oriented x 3.  SKIN: No obvious rash, lesion, or ulcer.   Data Review     CBC w Diff:  Lab Results  Component Value Date   WBC 9.1 05/31/2016   HGB 8.9 (L) 05/31/2016   HGB 8.2 (L) 04/03/2014   HCT 26.8 (L) 05/31/2016   HCT 26.2 (L) 04/03/2014   HCT 29.0 (L) 03/08/2014   PLT 203 05/31/2016   PLT 271 04/03/2014   LYMPHOPCT 2 05/27/2016   LYMPHOPCT 14.7 04/03/2014   MONOPCT 5 05/27/2016   MONOPCT 12.8 04/03/2014   EOSPCT 0 05/27/2016   EOSPCT 2.1 04/03/2014   BASOPCT 0 05/27/2016   BASOPCT 0.5 04/03/2014   CMP:  Lab Results  Component Value Date   NA 136 06/02/2016   NA 141 05/23/2015   NA 136 04/03/2014   K 3.8 06/02/2016   K 3.7 04/03/2014   CL 111 06/02/2016   CL 101 04/03/2014   CO2 23 06/02/2016   CO2 27 04/03/2014   BUN 10 06/02/2016   BUN 15 05/23/2015   BUN 39 (H) 04/03/2014   CREATININE 0.58 (L) 06/02/2016   CREATININE 0.96 04/03/2014   GLU 60 05/23/2015   PROT 6.4 (L) 05/15/2015   PROT 7.0 04/03/2014   ALBUMIN 2.3 (L) 05/15/2015   ALBUMIN 2.0 (L) 04/03/2014   BILITOT 0.6 05/15/2015   BILITOT 0.3 04/03/2014   ALKPHOS 4.13 (A) 05/23/2015   ALKPHOS 135 (H) 04/03/2014   AST 28 05/23/2015   AST 38 (H) 04/03/2014   ALT 19 05/23/2015   ALT 30 04/03/2014  .  Micro Results Recent  Results (from the past 240 hour(s))  Urine culture     Status: Abnormal   Collection Time: 05/27/16  1:00 PM  Result Value Ref Range Status   Specimen Description URINE, RANDOM  Final   Special Requests NONE  Final   Culture MULTIPLE SPECIES PRESENT, SUGGEST RECOLLECTION (A)  Final   Report Status 05/29/2016 FINAL  Final  CULTURE, BLOOD (ROUTINE X 2) w Reflex to ID Panel     Status: None   Collection Time: 05/27/16  2:40 PM  Result Value Ref Range Status   Specimen Description BLOOD RIGHT ARM  Final   Special Requests Blood Culture adequate volume  Final   Culture NO GROWTH 5 DAYS  Final   Report Status 06/01/2016 FINAL  Final  CULTURE, BLOOD (ROUTINE X 2) w Reflex to ID Panel  Status: None   Collection Time: 05/27/16  2:40 PM  Result Value Ref Range Status   Specimen Description BLOOD RIGHT FOREARM  Final   Special Requests Blood Culture adequate volume  Final   Culture NO GROWTH 5 DAYS  Final   Report Status 06/01/2016 FINAL  Final        Code Status Orders        Start     Ordered   05/27/16 2039  Full code  Continuous     05/27/16 2038    Code Status History    Date Active Date Inactive Code Status Order ID Comments User Context   04/28/2015  4:32 AM 05/02/2015 12:29 AM Full Code [id number]  Rodolph Bong, MD Inpatient   03/07/2014  9:05 PM 03/27/2014 10:36 PM Full Code [id number]  Barth Kirks Inpatient          Contact information for after-discharge care    Destination    [facility] .   Specialty:  Skilled Nursing Facility Contact information: [address] [place] [address] [phone]              Discharge Medications   Allergies as of 06/02/2016   No Known Allergies     Medication List    TAKE these medications   acetaminophen 325 MG tablet Commonly known as:  TYLENOL Take 2 tablets (650 mg total) by mouth every 6 (six) hours as needed for mild pain (or Fever >/= 101).    amoxicillin-clavulanate 875-125 MG tablet Commonly known as:  AUGMENTIN Take 1 tablet by mouth every 12 (twelve) hours.   calcium carbonate 1500 (600 Ca) MG Tabs tablet Commonly known as:  OSCAL Take 600 mg of elemental calcium by mouth 2 (two) times daily with a meal.   citalopram 10 MG tablet Commonly known as:  CELEXA Take 10 mg by mouth daily. For depression   collagenase ointment Commonly known as:  SANTYL Apply topically daily.   DECUBI-VITE PO Take 1 tablet by mouth daily.   DULoxetine 20 MG capsule Commonly known as:  CYMBALTA Take 20 mg by mouth daily.   ergocalciferol 50000 units capsule Commonly known as:  VITAMIN D2 Take 50,000 Units by mouth once a week.   feeding supplement (PRO-STAT SUGAR FREE 64) Liqd Take 30 mLs by mouth 2 (two) times daily with a meal. Stop date 06/04/15   ferrous sulfate 325 (65 FE) MG tablet Take 325 mg by mouth 2 (two) times daily.   folic acid 1 MG tablet Commonly known as:  FOLVITE Take 1 mg by mouth daily.   ipratropium-albuterol 0.5-2.5 (3) MG/3ML Soln Commonly known as:  DUONEB Take 3 mLs by nebulization every 6 (six) hours as needed.   levothyroxine 200 MCG tablet Commonly known as:  SYNTHROID, LEVOTHROID TAKE ONE TABLET BY MOUTH DAILY   mirtazapine 7.5 MG tablet Commonly known as:  REMERON Take 7.5 mg by mouth at bedtime. For insomnia. Take (1/2 tablet)   morphine 15 MG 12 hr tablet Commonly known as:  MS CONTIN Take 1 tablet (15 mg total) by mouth every 12 (twelve) hours. For pain   oxyCODONE-acetaminophen 5-325 MG tablet Commonly known as:  PERCOCET/ROXICET Take 1 tablet by mouth every 4 (four) hours as needed for moderate pain or severe pain (Take 2 tablets every 4 hours as needed for severe pain).   pantoprazole 40 MG tablet Commonly known as:  PROTONIX Take 40 mg by mouth daily. For GERD  polyethylene glycol packet Commonly known as:  MIRALAX / GLYCOLAX Take 17 g by mouth daily as needed for mild  constipation.   potassium chloride SA 20 MEQ tablet Commonly known as:  K-DUR,KLOR-CON Take 20 mEq by mouth daily.   Pramoxine-Calamine 1-3 % Crea Commonly known as:  AVEENO ANTI-ITCH Apply 1 Bottle topically every 8 (eight) hours.   protein supplement shake Liqd Commonly known as:  PREMIER PROTEIN Take 325 mLs (11 oz total) by mouth 4 (four) times daily.   simethicone 80 MG chewable tablet Commonly known as:  MYLICON Chew 1 tablet (80 mg total) by mouth every 6 (six) hours as needed for flatulence.   TORSEMIDE PO Take 30 mg by mouth 2 (two) times daily. Hold for sbp < 110   vitamin C 500 MG tablet Commonly known as:  ASCORBIC ACID Take 500 mg by mouth daily. FOR 30 DAYS   STOP DATE 06/04/15          Total Time in preparing paper work, data evaluation and todays exam - 35 minutes  Auburn Bilberry M.D on 06/02/2016 at 1:04 PM  [facility]   Office  [phone]

## 2016-06-02 NOTE — Clinical Social Work Placement (Signed)
   CLINICAL SOCIAL WORK PLACEMENT  NOTE  Date:  06/02/2016  Patient Details  Name: [name] MRN: [id number] Date of Birth: 1960/06/30  Clinical Social Work is seeking post-discharge placement for this patient at the Skilled  Nursing Facility level of care (*CSW will initial, date and re-position this form in  chart as items are completed):  Yes   Patient/family provided with [facility] Clinical Social Work Department's list of facilities offering this level of care within the geographic area requested by the patient (or if unable, by the patient's family).  Yes   Patient/family informed of their freedom to choose among providers that offer the needed level of care, that participate in Medicare, Medicaid or managed care program needed by the patient, have an available bed and are willing to accept the patient.  Yes   Patient/family informed of [place]'s ownership interest in [facility] and [facility], as well as of the fact that they are under no obligation to receive care at these facilities.  PASRR submitted to EDS on       PASRR number received on       Existing PASRR number confirmed on 05/31/16     FL2 transmitted to all facilities in geographic area requested by pt/family on 05/31/16     FL2 transmitted to all facilities within larger geographic area on       Patient informed that his/her managed care company has contracts with or will negotiate with certain facilities, including the following:        Yes   Patient/family informed of bed offers received.  Patient chooses bed at  [facility])     Physician recommends and patient chooses bed at  [facility])    Patient to be transferred to  [facility]) on 06/02/16.  Patient to be transferred to facility by  (EMS)     Patient family notified on 06/02/16 of transfer.  Name of family member notified:   (patient requested his sister not be contacted)     PHYSICIAN       Additional Comment:     _______________________________________________ York Spaniel, LCSW 06/02/2016, 11:59 AM

## 2016-06-02 NOTE — Clinical Social Work Note (Signed)
CSW spoke with patient regarding additional bed offers and patient chose [facility] due to it being on this side of town. Patient inquired as to why [facility] rescinded the bed offer and CSW informed patient what was reported to CSW. CSW let patient know that his sister was wanting [place] to contact her regarding discharge planning and that she did not call CSW back yesterday. CSW asked patient if he wanted [place] to continue to try and reach his sister and patient replied that there was no need to try and contact his sister and that he could make his own decisions. Patient had been on the phone earlier with a female friend who was helping patient by bringing him some snacks from the house. Discharge information sent to [facility] [employer]. Nurse to call report and due to pressure ulcers, patient will transport via EMS.  York Spaniel MSW,LCSW [phone]

## 2016-06-05 ENCOUNTER — Encounter: Payer: 59 | Attending: Surgery | Admitting: Surgery

## 2016-06-05 DIAGNOSIS — G4730 Sleep apnea, unspecified: Secondary | ICD-10-CM | POA: Diagnosis not present

## 2016-06-05 DIAGNOSIS — L89129 Pressure ulcer of left upper back, unspecified stage: Secondary | ICD-10-CM | POA: Insufficient documentation

## 2016-06-05 DIAGNOSIS — Z9884 Bariatric surgery status: Secondary | ICD-10-CM | POA: Insufficient documentation

## 2016-06-05 DIAGNOSIS — F329 Major depressive disorder, single episode, unspecified: Secondary | ICD-10-CM | POA: Diagnosis not present

## 2016-06-05 DIAGNOSIS — Z79899 Other long term (current) drug therapy: Secondary | ICD-10-CM | POA: Insufficient documentation

## 2016-06-05 DIAGNOSIS — Z89511 Acquired absence of right leg below knee: Secondary | ICD-10-CM | POA: Diagnosis not present

## 2016-06-05 DIAGNOSIS — D649 Anemia, unspecified: Secondary | ICD-10-CM | POA: Insufficient documentation

## 2016-06-05 DIAGNOSIS — E440 Moderate protein-calorie malnutrition: Secondary | ICD-10-CM | POA: Insufficient documentation

## 2016-06-05 DIAGNOSIS — I10 Essential (primary) hypertension: Secondary | ICD-10-CM | POA: Insufficient documentation

## 2016-06-07 NOTE — Progress Notes (Signed)
TYWANA, ROBOTHAM ([id number]) Visit Report for 06/05/2016 Allergy List Details Patient Name: ANALIZ, TVEDT Date of Service: 06/05/2016 8:00 AM Medical Record Number: [id number] Patient Account Number: [id number] Date of Birth/Sex: [birth year] (56 y.o. Female) Treating RN: Curtis Sites Primary Care Jericca Russett: SYSTEM, PCP Other Clinician: Referring Shanigua Gibb: Einar Crow Treating Markhi Kleckner/Extender: Rudene Re in Treatment: 0 Allergies Active Allergies No Known Drug Allergies Allergy Notes Electronic Signature(s) Signed: 06/05/2016 4:55:58 PM By: Curtis Sites Entered By: Curtis Sites on 06/05/2016 08:16:07 Despina Hick ([id number]) -------------------------------------------------------------------------------- Arrival Information Details Patient Name: Despina Hick Date of Service: 06/05/2016 8:00 AM Medical Record Number: [id number] Patient Account Number: [id number] Date of Birth/Sex: Feb 12, 1961 (56 y.o. Female) Treating RN: Curtis Sites Primary Care Shonda Mandarino: SYSTEM, PCP Other Clinician: Referring Murrel Freet: Einar Crow Treating Luz Burcher/Extender: Rudene Re in Treatment: 0 Visit Information Patient Arrived: Wheel Chair Arrival Time: 08:14 Accompanied By: self Transfer Assistance: None Patient Identification Verified: Yes Secondary Verification Process Yes Completed: Electronic Signature(s) Signed: 06/05/2016 4:55:58 PM By: Curtis Sites Entered By: Curtis Sites on 06/05/2016 08:15:02 Despina Hick ([id number]) -------------------------------------------------------------------------------- Encounter Discharge Information Details Patient Name: Despina Hick Date of Service: 06/05/2016 8:00 AM Medical Record Number: [id number] Patient Account Number: [id number] Date of Birth/Sex: 1961-01-09 (56 y.o. Female) Treating RN: Curtis Sites Primary Care Electra Paladino: SYSTEM, PCP Other Clinician: Referring Clancy Leiner: Einar Crow Treating Leonidus Rowand/Extender:  Rudene Re in Treatment: 0 Encounter Discharge Information Items Discharge Pain Level: 0 Discharge Condition: Stable Ambulatory Status: Wheelchair Discharge Destination: Nursing Home Transportation: Private Auto Accompanied By: self Schedule Follow-up Appointment: Yes Medication Reconciliation completed and provided to Patient/Care No Alanii Ramer: Provided on Clinical Summary of Care: 06/05/2016 Form Type Recipient Paper Patient KW Electronic Signature(s) Signed: 06/05/2016 9:27:32 AM By: Gwenlyn Perking Entered By: Gwenlyn Perking on 06/05/2016 09:27:32 Despina Hick ([id number]) -------------------------------------------------------------------------------- Multi Wound Chart Details Patient Name: Despina Hick Date of Service: 06/05/2016 8:00 AM Medical Record Number: [id number] Patient Account Number: [id number] Date of Birth/Sex: Apr 21, 1960 (56 y.o. Female) Treating RN: Curtis Sites Primary Care Natayla Cadenhead: SYSTEM, PCP Other Clinician: Referring Yuriy Cui: Einar Crow Treating Gabino Hagin/Extender: Rudene Re in Treatment: 0 Vital Signs Height(in): Pulse(bpm): 91 Weight(lbs): Blood Pressure 94/65 (mmHg): Body Mass Index(BMI): Temperature(F): 98.2 Respiratory Rate 18 (breaths/min): Photos: [1:No Photos] [N/A:N/A] Wound Location: [1:Right Back] [N/A:N/A] Wounding Event: [1:Pressure Injury] [N/A:N/A] Primary Etiology: [1:Pressure Ulcer] [N/A:N/A] Comorbid History: [1:Anemia, Sleep Apnea, Hypertension, Type II Diabetes] [N/A:N/A] Date Acquired: [1:05/19/2016] [N/A:N/A] Weeks of Treatment: [1:0] [N/A:N/A] Wound Status: [1:Open] [N/A:N/A] Clustered Wound: [1:Yes] [N/A:N/A] Clustered Quantity: [1:4] [N/A:N/A] Measurements L x W x D 19.5x15.5x0.1 [N/A:N/A] (cm) Area (cm) : [1:237.387] [N/A:N/A] Volume (cm) : [1:23.739] [N/A:N/A] Classification: [1:Unstageable/Unclassified] [N/A:N/A] Exudate Amount: [1:Large] [N/A:N/A] Exudate Type: [1:Serous]  [N/A:N/A] Exudate Color: [1:amber] [N/A:N/A] Wound Margin: [1:Flat and Intact] [N/A:N/A] Granulation Amount: [1:Small (1-33%)] [N/A:N/A] Granulation Quality: [1:Red] [N/A:N/A] Necrotic Amount: [1:Large (67-100%)] [N/A:N/A] Necrotic Tissue: [1:Eschar, Adherent Slough] [N/A:N/A] Exposed Structures: [1:Fascia: No Fat Layer (Subcutaneous Tissue) Exposed: No Tendon: No Muscle: No] [N/A:N/A] Joint: No Bone: No Limited to Skin Breakdown Epithelialization: Small (1-33%) N/A N/A Debridement: Debridement (09323- N/A N/A 11047) Pre-procedure 09:02 N/A N/A Verification/Time Out Taken: Pain Control: Lidocaine 4% Topical N/A N/A Solution Tissue Debrided: Fibrin/Slough, N/A N/A Subcutaneous Level: Skin/Subcutaneous N/A N/A Tissue Debridement Area (sq 302.25 N/A N/A cm): Instrument: Blade N/A N/A Bleeding: Minimum N/A N/A Hemostasis Achieved: Pressure N/A N/A Procedural Pain: 0 N/A N/A Post Procedural Pain: 0 N/A N/A Debridement Treatment Procedure was tolerated N/A N/A Response: well Post Debridement 19.5x15.5x0.1 N/A N/A Measurements L x W x D (cm) Post  Debridement 23.739 N/A N/A Volume: (cm) Post Debridement Unstageable/Unclassified N/A N/A Stage: Periwound Skin Texture: Excoriation: No N/A N/A Induration: No Callus: No Crepitus: No Rash: No Scarring: No Periwound Skin Maceration: No N/A N/A Moisture: Dry/Scaly: No Periwound Skin Color: Atrophie Blanche: No N/A N/A Cyanosis: No Ecchymosis: No Erythema: No Hemosiderin Staining: No Mottled: No Pallor: No Rubor: No Temperature: No Abnormality N/A N/A Yes N/A N/A MARSHAWN, NINNEMAN ([id number]) Tenderness on Palpation: Wound Preparation: Ulcer Cleansing: N/A N/A Rinsed/Irrigated with Saline Topical Anesthetic Applied: Other: lidocaine 4% Procedures Performed: Debridement N/A N/A Treatment Notes Electronic Signature(s) Signed: 06/05/2016 9:42:07 AM By: Evlyn Kanner MD, FACS Entered By: Evlyn Kanner on 06/05/2016  09:42:07 Despina Hick ([id number]) -------------------------------------------------------------------------------- Multi-Disciplinary Care Plan Details Patient Name: Despina Hick Date of Service: 06/05/2016 8:00 AM Medical Record Number: [id number] Patient Account Number: [id number] Date of Birth/Sex: 13-Dec-1960 (56 y.o. Female) Treating RN: Curtis Sites Primary Care Osha Errico: SYSTEM, PCP Other Clinician: Referring Dupree Givler: Einar Crow Treating Conard Alvira/Extender: Rudene Re in Treatment: 0 Active Inactive ` Abuse / Safety / Falls / Self Care Management Nursing Diagnoses: Impaired physical mobility Goals: Patient will remain injury free Date Initiated: 06/05/2016 Target Resolution Date: 08/29/2016 Goal Status: Active Interventions: Assess fall risk on admission and as needed Notes: ` Orientation to the Wound Care Program Nursing Diagnoses: Knowledge deficit related to the wound healing center program Goals: Patient/caregiver will verbalize understanding of the Wound Healing Center Program Date Initiated: 06/05/2016 Target Resolution Date: 08/29/2016 Goal Status: Active Interventions: Provide education on orientation to the wound center Notes: ` Wound/Skin Impairment Nursing Diagnoses: Impaired tissue integrity MARSHE, SHRESTHA ([id number]) Goals: Patient/caregiver will verbalize understanding of skin care regimen Date Initiated: 06/05/2016 Target Resolution Date: 08/29/2016 Goal Status: Active Ulcer/skin breakdown will have a volume reduction of 30% by week 4 Date Initiated: 06/05/2016 Target Resolution Date: 08/29/2016 Goal Status: Active Ulcer/skin breakdown will have a volume reduction of 50% by week 8 Date Initiated: 06/05/2016 Target Resolution Date: 08/29/2016 Goal Status: Active Ulcer/skin breakdown will have a volume reduction of 80% by week 12 Date Initiated: 06/05/2016 Target Resolution Date: 08/29/2016 Goal Status: Active Ulcer/skin breakdown will heal  within 14 weeks Date Initiated: 06/05/2016 Target Resolution Date: 08/29/2016 Goal Status: Active Interventions: Assess patient/caregiver ability to obtain necessary supplies Assess patient/caregiver ability to perform ulcer/skin care regimen upon admission and as needed Assess ulceration(s) every visit Notes: Electronic Signature(s) Signed: 06/05/2016 4:55:58 PM By: Curtis Sites Entered By: Curtis Sites on 06/05/2016 08:58:34 Despina Hick ([id number]) -------------------------------------------------------------------------------- Pain Assessment Details Patient Name: Despina Hick Date of Service: 06/05/2016 8:00 AM Medical Record Number: [id number] Patient Account Number: [id number] Date of Birth/Sex: 12-22-1960 (55 y.o. Female) Treating RN: Curtis Sites Primary Care Somaya Grassi: SYSTEM, PCP Other Clinician: Referring Gittel Mccamish: Einar Crow Treating Miro Balderson/Extender: Rudene Re in Treatment: 0 Active Problems Location of Pain Severity and Description of Pain Patient Has Paino Yes Site Locations Pain Location: Pain in Ulcers With Dressing Change: Yes Duration of the Pain. Constant / Intermittento Constant Pain Management and Medication Current Pain Management: Notes Topical or injectable lidocaine is offered to patient for acute pain when surgical debridement is performed. If needed, Patient is instructed to use over the counter pain medication for the following 24-48 hours after debridement. Wound care MDs do not prescribed pain medications. Patient has chronic pain or uncontrolled pain. Patient has been instructed to make an appointment with their Primary Care Physician for pain management. Electronic Signature(s) Signed: 06/05/2016 4:55:58 PM By: Curtis Sites Entered By: Curtis Sites on 06/05/2016 08:15:21 Despina Hick (  [id number]) -------------------------------------------------------------------------------- Patient/Caregiver Education Details Patient  Name: Despina Hick Date of Service: 06/05/2016 8:00 AM Medical Record Number: [id number] Patient Account Number: [id number] Date of Birth/Gender: [birth year] (56 y.o. Female) Treating RN: Curtis Sites Primary Care Physician: SYSTEM, PCP Other Clinician: Referring Physician: Einar Crow Treating Physician/Extender: Rudene Re in Treatment: 0 Education Assessment Education Provided To: Patient Education Topics Provided Wound/Skin Impairment: Handouts: Other: wound care as ordered Methods: Demonstration, Explain/Verbal Responses: State content correctly Electronic Signature(s) Signed: 06/05/2016 4:55:58 PM By: Curtis Sites Entered By: Curtis Sites on 06/05/2016 09:01:15 Despina Hick ([id number]) -------------------------------------------------------------------------------- Wound Assessment Details Patient Name: Despina Hick Date of Service: 06/05/2016 8:00 AM Medical Record Number: [id number] Patient Account Number: [id number] Date of Birth/Sex: 11-26-1960 (56 y.o. Female) Treating RN: Curtis Sites Primary Care Torin Modica: SYSTEM, PCP Other Clinician: Referring Airianna Kreischer: Einar Crow Treating Annalycia Done/Extender: Rudene Re in Treatment: 0 Wound Status Wound Number: 1 Primary Pressure Ulcer Etiology: Wound Location: Right Back Wound Open Wounding Event: Pressure Injury Status: Date Acquired: 05/19/2016 Comorbid Anemia, Sleep Apnea, Hypertension, Weeks Of Treatment: 0 History: Type II Diabetes Clustered Wound: Yes Photos Photo Uploaded By: Curtis Sites on 06/05/2016 13:36:30 Wound Measurements Length: (cm) 19.5 % Reduction in Width: (cm) 15.5 % Reduction in Depth: (cm) 0.1 Epithelializati Clustered Quantity: 4 Tunneling: Area: (cm) 237.387 Undermining: Volume: (cm) 23.739 Area: Volume: on: Small (1-33%) No No Wound Description Classification: Unstageable/Unclassified Foul Odor Afte Wound Margin: Flat and Intact  Slough/Fibrino Exudate Amount: Large Exudate Type: Serous Exudate Color: amber r Cleansing: No Yes Wound Bed Granulation Amount: Small (1-33%) Exposed Structure Granulation Quality: Red Fascia Exposed: No Necrotic Amount: Large (67-100%) Fat Layer (Subcutaneous Tissue) Exposed: No Whitlatch, Caryn Bee ([id number]) Necrotic Quality: Eschar, Adherent Slough Tendon Exposed: No Muscle Exposed: No Joint Exposed: No Bone Exposed: No Limited to Skin Breakdown Periwound Skin Texture Texture Color No Abnormalities Noted: No No Abnormalities Noted: No Callus: No Atrophie Blanche: No Crepitus: No Cyanosis: No Excoriation: No Ecchymosis: No Induration: No Erythema: No Rash: No Hemosiderin Staining: No Scarring: No Mottled: No Pallor: No Moisture Rubor: No No Abnormalities Noted: No Dry / Scaly: No Temperature / Pain Maceration: No Temperature: No Abnormality Tenderness on Palpation: Yes Wound Preparation Ulcer Cleansing: Rinsed/Irrigated with Saline Topical Anesthetic Applied: Other: lidocaine 4%, Electronic Signature(s) Signed: 06/05/2016 4:55:58 PM By: Curtis Sites Entered By: Curtis Sites on 06/05/2016 08:44:08 Despina Hick ([id number]) -------------------------------------------------------------------------------- Vitals Details Patient Name: Despina Hick Date of Service: 06/05/2016 8:00 AM Medical Record Number: [id number] Patient Account Number: [id number] Date of Birth/Sex: [birth year] (55 y.o. Female) Treating RN: Curtis Sites Primary Care Johnna Bollier: SYSTEM, PCP Other Clinician: Referring Maziah Keeling: Einar Crow Treating Akiah Bauch/Extender: Rudene Re in Treatment: 0 Vital Signs Time Taken: 08:16 Temperature (F): 98.2 Pulse (bpm): 91 Respiratory Rate (breaths/min): 18 Blood Pressure (mmHg): 94/65 Reference Range: 80 - 120 mg / dl Electronic Signature(s) Signed: 06/05/2016 4:55:58 PM By: Curtis Sites Entered By: Curtis Sites on 06/05/2016  74:25:95

## 2016-06-07 NOTE — Progress Notes (Signed)
CORVETTE, ORSER ([id number]) Visit Report for 06/05/2016 Abuse/Suicide Risk Screen Details Patient Name: [name], [name] Date of Service: 06/05/2016 8:00 AM Medical Record Number: [id number] Patient Account Number: [id number] Date of Birth/Sex: [birth year] (56 y.o. Female) Treating RN: Curtis Sites Primary Care Gyselle Matthew: SYSTEM, PCP Other Clinician: Referring Janith Nielson: Einar Crow Treating Bunnie Lederman/Extender: Rudene Re in Treatment: 0 Abuse/Suicide Risk Screen Items Answer ABUSE/SUICIDE RISK SCREEN: Has anyone close to you tried to hurt or harm you recentlyo No Do you feel uncomfortable with anyone in your familyo No Has anyone forced you do things that you didnot want to doo No Do you have any thoughts of harming yourselfo No Patient displays signs or symptoms of abuse and/or neglect. No Electronic Signature(s) Signed: 06/05/2016 4:55:58 PM By: Curtis Sites Entered By: Curtis Sites on 06/05/2016 08:19:36 [name] ([id number]) -------------------------------------------------------------------------------- Activities of Daily Living Details Patient Name: [name] Date of Service: 06/05/2016 8:00 AM Medical Record Number: [id number] Patient Account Number: [id number] Date of Birth/Sex: [birth year] (56 y.o. Female) Treating RN: Curtis Sites Primary Care Nussen Pullin: SYSTEM, PCP Other Clinician: Referring Regino Fournet: Einar Crow Treating Eloina Ergle/Extender: Rudene Re in Treatment: 0 Activities of Daily Living Items Answer Activities of Daily Living (Please select one for each item) Drive Automobile Not Able Take Medications Need Assistance Use Telephone Completely Able Care for Appearance Completely Able Use Toilet Need Assistance Bath / Shower Need Assistance Dress Self Need Assistance Feed Self Completely Able Walk Need Assistance Get In / Out Bed Need Assistance Housework Not Able Prepare Meals Need Assistance Handle Money Need Assistance Shop  for Self Need Assistance Electronic Signature(s) Signed: 06/05/2016 4:55:58 PM By: Curtis Sites Entered By: Curtis Sites on 06/05/2016 08:20:56 [name] ([id number]) -------------------------------------------------------------------------------- Education Assessment Details Patient Name: [name] Date of Service: 06/05/2016 8:00 AM Medical Record Number: [id number] Patient Account Number: [id number] Date of Birth/Sex: 02/05/1961 (56 y.o. Female) Treating RN: Curtis Sites Primary Care Nilton Lave: SYSTEM, PCP Other Clinician: Referring Maddalynn Barnard: Einar Crow Treating Mersedes Alber/Extender: Rudene Re in Treatment: 0 Primary Learner Assessed: Caregiver Reason Patient is not Primary Learner: wound location Learning Preferences/Education Level/Primary Language Learning Preference: Explanation, Demonstration Highest Education Level: College or Above Preferred Language: English Cognitive Barrier Assessment/Beliefs Language Barrier: No Translator Needed: No Memory Deficit: No Emotional Barrier: No Cultural/Religious Beliefs Affecting Medical No Care: Physical Barrier Assessment Impaired Vision: No Impaired Hearing: No Decreased Hand dexterity: No Knowledge/Comprehension Assessment Knowledge Level: Medium Comprehension Level: Medium Ability to understand written Medium instructions: Ability to understand verbal Medium instructions: Motivation Assessment Anxiety Level: Calm Cooperation: Cooperative Education Importance: Acknowledges Need Interest in Health Problems: Asks Questions Perception: Coherent Willingness to Engage in Self- Medium Management Activities: Readiness to Engage in Self- Medium Management Activities: [name], [name] ([id number]) Electronic Signature(s) Signed: 06/05/2016 4:55:58 PM By: Curtis Sites Entered By: Curtis Sites on 06/05/2016 08:21:23 [name]  ([id number]) -------------------------------------------------------------------------------- Fall Risk Assessment Details Patient Name: [name] Date of Service: 06/05/2016 8:00 AM Medical Record Number: [id number] Patient Account Number: [id number] Date of Birth/Sex: 01-19-1961 (56 y.o. Female) Treating RN: Curtis Sites Primary Care Tekila Caillouet: SYSTEM, PCP Other Clinician: Referring Gasper Hopes: Einar Crow Treating Maki Sweetser/Extender: Rudene Re in Treatment: 0 Fall Risk Assessment Items Have you had 2 or more falls in the last 12 monthso 0 No Have you had any fall that resulted in injury in the last 12 monthso 0 Yes FALL RISK ASSESSMENT: History of falling - immediate or within 3 months 25 Yes Secondary diagnosis 0 No Ambulatory aid None/bed rest/wheelchair/nurse 0 Yes Crutches/cane/walker 0 No Furniture 0  No IV Access/Saline Lock 0 No Gait/Training Normal/bed rest/immobile 0 No Weak 10 Yes Impaired 0 No Mental Status Oriented to own ability 0 Yes Electronic Signature(s) Signed: 06/05/2016 4:55:58 PM By: Curtis Sites Entered By: Curtis Sites on 06/05/2016 08:21:48 [name] ([id number]) -------------------------------------------------------------------------------- Nutrition Risk Assessment Details Patient Name: [name] Date of Service: 06/05/2016 8:00 AM Medical Record Number: [id number] Patient Account Number: [id number] Date of Birth/Sex: 01/15/1961 (56 y.o. Female) Treating RN: Curtis Sites Primary Care Kellye Mizner: SYSTEM, PCP Other Clinician: Referring Jniyah Dantuono: Einar Crow Treating Andron Marrazzo/Extender: Rudene Re in Treatment: 0 Height (in): Weight (lbs): Body Mass Index (BMI): Nutrition Risk Assessment Items NUTRITION RISK SCREEN: I have an illness or condition that made me change the kind and/or 0 No amount of food I eat I eat fewer than two meals per day 0 No I eat few fruits and vegetables, or milk products 0 No I  have three or more drinks of beer, liquor or wine almost every day 0 No I have tooth or mouth problems that make it hard for me to eat 0 No I don't always have enough money to buy the food I need 0 No I eat alone most of the time 0 No I take three or more different prescribed or over-the-counter drugs a 1 Yes day Without wanting to, I have lost or gained 10 pounds in the last six 0 No months I am not always physically able to shop, cook and/or feed myself 0 No Nutrition Protocols Good Risk Protocol 0 No interventions needed Moderate Risk Protocol Electronic Signature(s) Signed: 06/05/2016 4:55:58 PM By: Curtis Sites Entered By: Curtis Sites on 06/05/2016 08:21:55

## 2016-06-07 NOTE — Progress Notes (Signed)
AMERICUS, SCHEURICH ([id number]) Visit Report for 06/05/2016 Chief Complaint Document Details Patient Name: [name], [name] Date of Service: 06/05/2016 8:00 AM Medical Record Number: [id number] Patient Account Number: [id number] Date of Birth/Sex: 15-Oct-1960 (56 y.o. Female) Treating RN: Curtis Sites Primary Care Provider: SYSTEM, PCP Other Clinician: Referring Provider: Einar Crow Treating Provider/Extender: Rudene Re in Treatment: 0 Information Obtained from: Patient Chief Complaint Patient is at the clinic for treatment of an open pressure ulcer the left chest wall and back which she's had for about 3 weeks now. Electronic Signature(s) Signed: 06/05/2016 9:43:55 AM By: Evlyn Kanner MD, FACS Entered By: Evlyn Kanner on 06/05/2016 09:43:54 [name] ([id number]) -------------------------------------------------------------------------------- Debridement Details Patient Name: [name] Date of Service: 06/05/2016 8:00 AM Medical Record Number: [id number] Patient Account Number: [id number] Date of Birth/Sex: 15-Oct-1960 (56 y.o. Female) Treating RN: Curtis Sites Primary Care Provider: SYSTEM, PCP Other Clinician: Referring Provider: Einar Crow Treating Provider/Extender: Rudene Re in Treatment: 0 Debridement Performed for Wound #1 Left Back Assessment: Performed By: Physician Evlyn Kanner, MD Debridement: Open Wound/Selective Debridement Selective Description: Pre-procedure Yes - 09:02 Verification/Time Out Taken: Start Time: 09:02 Pain Control: Lidocaine 4% Topical Solution Level: Skin/Dermis Total Area Debrided (L x 12 (cm) x 4 (cm) = 48 (cm) W): Tissue and other Viable, Non-Viable, Fibrin/Slough, Subcutaneous material debrided: Instrument: Blade Bleeding: Minimum Hemostasis Achieved: Pressure End Time: 09:03 Procedural Pain: 0 Post Procedural Pain: 0 Response to Treatment: Procedure was tolerated well Post Debridement Measurements of  Total Wound Length: (cm) 19.5 Stage: Unstageable/Unclassified Width: (cm) 15.5 Depth: (cm) 0.1 Volume: (cm) 23.739 Character of Wound/Ulcer Post Requires Further Debridement: Debridement Severity of Tissue Post Fat layer exposed Debridement: Post Procedure Diagnosis Same as Pre-procedure Electronic Signature(s) Signed: 06/05/2016 9:43:19 AM By: Evlyn Kanner MD, FACS [name], [name] ([id number]) Signed: 06/05/2016 4:55:58 PM By: Curtis Sites Entered By: Evlyn Kanner on 06/05/2016 09:43:19 [name] ([id number]) -------------------------------------------------------------------------------- HPI Details Patient Name: [name] Date of Service: 06/05/2016 8:00 AM Medical Record Number: [id number] Patient Account Number: [id number] Date of Birth/Sex: [birth year] (56 y.o. Female) Treating RN: Curtis Sites Primary Care Provider: SYSTEM, PCP Other Clinician: Referring Provider: Einar Crow Treating Provider/Extender: Rudene Re in Treatment: 0 History of Present Illness HPI Description: 56 year old gentleman with a known history of multiple medical problems, has recently been admitted to hospital and had diabetes mellitus several years ago, depression, hypertension, sleep apnea, anemia and status right BKA. He had a fall and had bilateral rib fractures and cellulitis of his chest wall which was treated with antibiotics. He was advised Augmentin and other treatment besides Santyl ointment locally. Discharge diagnoses include acute cystitis, chest wall cellulitis, multiple rib fractures more, compression injury to the skin, protein calorie malnutrition, status post right BKA. He is also known to have bariatric surgery, status post gastric resection, including a biliopancreatic diversion and duodenal switch procedure in August 2006 and hence had iron deficiency anemia. His past surgical history also includes appendectomy, cholecystectomy, colon surgery, hernia  repair. While in hospital the wound care nurse saw him for a unstageable left lateral flank injury. He has never been a smoker. Of note since his bariatric surgery he has not been treated for diabetes mellitus for several years and has normal hemoglobin A1c is checked every year Electronic Signature(s) Signed: 06/05/2016 9:45:33 AM By: Evlyn Kanner MD, FACS Previous Signature: 06/05/2016 8:48:00 AM Version By: Evlyn Kanner MD, FACS Previous Signature: 06/05/2016 8:44:36 AM Version By: Evlyn Kanner MD, FACS Entered By: Evlyn Kanner on 06/05/2016 09:45:33 [name] ([id number]) --------------------------------------------------------------------------------  Physical Exam Details Patient Name: [name], [name] Date of Service: 06/05/2016 8:00 AM Medical Record Number: [id number] Patient Account Number: [id number] Date of Birth/Sex: Apr 08, 1960 (56 y.o. Female) Treating RN: Curtis Sites Primary Care Provider: SYSTEM, PCP Other Clinician: Referring Provider: Einar Crow Treating Provider/Extender: Rudene Re in Treatment: 0 Constitutional . Pulse regular. Respirations normal and unlabored. Afebrile. . Eyes Nonicteric. Reactive to light. Ears, Nose, Mouth, and Throat Lips, teeth, and gums WNL.Marland Kitchen Moist mucosa without lesions. Neck supple and nontender. No palpable supraclavicular or cervical adenopathy. Normal sized without goiter. Respiratory WNL. No retractions.. Breath sounds WNL, No rubs, rales, rhonchi, or wheeze.. Cardiovascular Heart rhythm and rate regular, no murmur or gallop.. Pedal Pulses WNL. No clubbing, cyanosis or edema. Gastrointestinal (GI) Abdomen without masses or tenderness.. No liver or spleen enlargement or tenderness.. Lymphatic No adneopathy. No adenopathy. No adenopathy. Musculoskeletal Adexa without tenderness or enlargement.. Digits and nails w/o clubbing, cyanosis, infection, petechiae, ischemia, or inflammatory conditions.. Integumentary  (Hair, Skin) No suspicious lesions. No crepitus or fluctuance. No peri-wound warmth or erythema. No masses.Marland Kitchen Psychiatric Judgement and insight Intact.. No evidence of depression, anxiety, or agitation.. Notes on his left lateral chest wall and areas of his back he has got significant areas of superficial stage I pressure injuries which needed no sharp debridement. There is another area which is full-thickness eschar and this is going to need sharp debridement and to start off with I have used a 15 blade and made several cross hatches in this so that the Santyl ointment will be able to penetrate down to the subcutaneous tissue Electronic Signature(s) Signed: 06/05/2016 9:46:37 AM By: Evlyn Kanner MD, FACS Entered By: Evlyn Kanner on 06/05/2016 09:46:36 [name] ([id number]) -------------------------------------------------------------------------------- Physician Orders Details Patient Name: [name] Date of Service: 06/05/2016 8:00 AM Medical Record Number: [id number] Patient Account Number: [id number] Date of Birth/Sex: 1960-12-28 (56 y.o. Female) Treating RN: Curtis Sites Primary Care Provider: SYSTEM, PCP Other Clinician: Referring Provider: Einar Crow Treating Provider/Extender: Rudene Re in Treatment: 0 Verbal / Phone Orders: No Diagnosis Coding Wound Cleansing Wound #1 Right Back o Clean wound with Normal Saline. o May shower with protection. Anesthetic Wound #1 Right Back o Topical Lidocaine 4% cream applied to wound bed prior to debridement Primary Wound Dressing Wound #1 Right Back o Santyl Ointment - on eschar areas o Silvadene Cream - on raw areas Secondary Dressing Wound #1 Right Back o ABD pad - secure with tape o XtraSorb - if needed for drainage Dressing Change Frequency Wound #1 Right Back o Change dressing every day. Follow-up Appointments Wound #1 Right Back o Return Appointment in 1 week. Off-Loading Wound #1  Right Back o Other: - keep pressure off left back Additional Orders / Instructions Wound #1 Right Back o Increase protein intake. MILDERD, MANOCCHIO ([id number]) Electronic Signature(s) Signed: 06/05/2016 2:17:24 PM By: Evlyn Kanner MD, FACS Signed: 06/05/2016 4:55:58 PM By: Curtis Sites Entered By: Curtis Sites on 06/05/2016 09:07:07 [name] ([id number]) -------------------------------------------------------------------------------- Problem List Details Patient Name: [name] Date of Service: 06/05/2016 8:00 AM Medical Record Number: [id number] Patient Account Number: [id number] Date of Birth/Sex: 09/17/1960 (55 y.o. Female) Treating RN: Curtis Sites Primary Care Provider: SYSTEM, PCP Other Clinician: Referring Provider: Einar Crow Treating Provider/Extender: Rudene Re in Treatment: 0 Active Problems ICD-10 Encounter Code Description Active Date Diagnosis L89.129 Pressure ulcer of left upper back, unspecified stage 06/05/2016 Yes E44.0 Moderate protein-calorie malnutrition 06/05/2016 Yes E66.01 Morbid (severe) obesity due to excess calories 06/05/2016 Yes Z89.511 Acquired absence of right  leg below knee 06/05/2016 Yes Inactive Problems Resolved Problems Electronic Signature(s) Signed: 06/05/2016 9:42:01 AM By: Evlyn Kanner MD, FACS Entered By: Evlyn Kanner on 06/05/2016 09:42:01 [name] ([id number]) -------------------------------------------------------------------------------- Progress Note Details Patient Name: [name] Date of Service: 06/05/2016 8:00 AM Medical Record Number: [id number] Patient Account Number: [id number] Date of Birth/Sex: 1961/01/11 (56 y.o. Female) Treating RN: Curtis Sites Primary Care Provider: SYSTEM, PCP Other Clinician: Referring Provider: Einar Crow Treating Provider/Extender: Rudene Re in Treatment: 0 Subjective Chief Complaint Information obtained from Patient Patient is at the clinic for  treatment of an open pressure ulcer the left chest wall and back which she's had for about 3 weeks now. History of Present Illness (HPI) 56 year old gentleman with a known history of multiple medical problems, has recently been admitted to hospital and had diabetes mellitus several years ago, depression, hypertension, sleep apnea, anemia and status right BKA. He had a fall and had bilateral rib fractures and cellulitis of his chest wall which was treated with antibiotics. He was advised Augmentin and other treatment besides Santyl ointment locally. Discharge diagnoses include acute cystitis, chest wall cellulitis, multiple rib fractures more, compression injury to the skin, protein calorie malnutrition, status post right BKA. He is also known to have bariatric surgery, status post gastric resection, including a biliopancreatic diversion and duodenal switch procedure in August 2006 and hence had iron deficiency anemia. His past surgical history also includes appendectomy, cholecystectomy, colon surgery, hernia repair. While in hospital the wound care nurse saw him for a unstageable left lateral flank injury. He has never been a smoker. Of note since his bariatric surgery he has not been treated for diabetes mellitus for several years and has normal hemoglobin A1c is checked every year Wound History Patient presents with 1 open wound that has been present for approximately March 26. Patient has been treating wound in the following manner: santyl. Laboratory tests have not been performed in the last month. Patient reportedly has not tested positive for an antibiotic resistant organism. Patient reportedly has not tested positive for osteomyelitis. Patient reportedly has had testing performed to evaluate circulation in the legs. Patient History Information obtained from Patient. Allergies No Known Drug Allergies Social History Never smoker, Marital Status - Single, Alcohol Use - Never, Drug Use  - No History, Caffeine Use - Rarely. [name], [name] ([id number]) Medical History Eyes Denies history of Cataracts, Glaucoma, Optic Neuritis Ear/Nose/Mouth/Throat Denies history of Chronic sinus problems/congestion, Middle ear problems Hematologic/Lymphatic Patient has history of Anemia Denies history of Hemophilia, Human Immunodeficiency Virus, Lymphedema, Sickle Cell Disease Respiratory Patient has history of Sleep Apnea Denies history of Aspiration, Asthma, Chronic Obstructive Pulmonary Disease (COPD), Pneumothorax, Tuberculosis Cardiovascular Patient has history of Hypertension Denies history of Angina, Arrhythmia, Congestive Heart Failure, Coronary Artery Disease, Deep Vein Thrombosis, Hypotension, Myocardial Infarction, Peripheral Arterial Disease, Peripheral Venous Disease, Phlebitis, Vasculitis Gastrointestinal Denies history of Cirrhosis , Colitis, Crohn s, Hepatitis A, Hepatitis B, Hepatitis C Endocrine Patient has history of Type II Diabetes Genitourinary Denies history of End Stage Renal Disease Immunological Denies history of Lupus Erythematosus, Raynaud s, Scleroderma Integumentary (Skin) Denies history of History of Burn, History of pressure wounds Musculoskeletal Denies history of Gout, Rheumatoid Arthritis, Osteoarthritis, Osteomyelitis Neurologic Denies history of Dementia, Neuropathy, Quadriplegia, Paraplegia, Seizure Disorder Oncologic Denies history of Received Chemotherapy, Received Radiation Psychiatric Denies history of Anorexia/bulimia, Confinement Anxiety Blood sugar is not tested. Medical And Surgical History Notes Gastrointestinal 2006 - weight loss surgery Musculoskeletal R BKA Review of Systems (ROS) Constitutional Symptoms (General Health)  The patient has no complaints or symptoms. Eyes The patient has no complaints or symptoms. Ear/Nose/Mouth/Throat The patient has no complaints or symptoms. [name], [name]  ([id number]) Hematologic/Lymphatic The patient has no complaints or symptoms. Respiratory The patient has no complaints or symptoms. Cardiovascular The patient has no complaints or symptoms. Gastrointestinal The patient has no complaints or symptoms. Endocrine The patient has no complaints or symptoms. Genitourinary The patient has no complaints or symptoms. Immunological The patient has no complaints or symptoms. Integumentary (Skin) The patient has no complaints or symptoms. Musculoskeletal The patient has no complaints or symptoms. Neurologic The patient has no complaints or symptoms. Oncologic The patient has no complaints or symptoms. Psychiatric The patient has no complaints or symptoms. Medications pro protein torsemide mirtazapine 7.5 mg tablet oral tablet oral acetaminophen 325 mg tablet oral tablet oral morphine 15 mg immediate release tablet oral tablet oral simethicone 80 mg chewable tablet oral tablet,chewable oral Aveeno Anti-Itch 1 %-3 % lotion topical lotion topical calcium carbonate 600 mg calcium (1,500 mg) tablet oral tablet oral folic acid 1 mg tablet oral tablet oral ferrous sulfate 325 mg (65 mg iron) tablet oral tablet oral Multiple Vitamin-Minerals tablet oral tablet oral oxycodone-acetaminophen 5 mg-325 mg tablet oral tablet oral amoxicillin 875 mg-potassium clavulanate 125 mg tablet oral tablet oral potassium chloride ER 20 mEq tablet,extended release oral tablet extended release oral citalopram 10 mg tablet oral tablet oral duloxetine 20 mg capsule,delayed release oral capsule,delayed release(DR/EC) oral levothyroxine 200 mcg tablet oral tablet oral Santyl 250 unit/gram topical ointment topical ointment topical Vitamin C 500 mg tablet oral tablet oral ergocalciferol (vitamin D2) 50,000 unit capsule oral capsule oral [name], [name] ([id number]) Objective Constitutional Pulse regular. Respirations normal and unlabored. Afebrile. Vitals Time  Taken: 8:16 AM, Temperature: 98.2 F, Pulse: 91 bpm, Respiratory Rate: 18 breaths/min, Blood Pressure: 94/65 mmHg. Eyes Nonicteric. Reactive to light. Ears, Nose, Mouth, and Throat Lips, teeth, and gums WNL.Marland Kitchen Moist mucosa without lesions. Neck supple and nontender. No palpable supraclavicular or cervical adenopathy. Normal sized without goiter. Respiratory WNL. No retractions.. Breath sounds WNL, No rubs, rales, rhonchi, or wheeze.. Cardiovascular Heart rhythm and rate regular, no murmur or gallop.. Pedal Pulses WNL. No clubbing, cyanosis or edema. Gastrointestinal (GI) Abdomen without masses or tenderness.. No liver or spleen enlargement or tenderness.. Lymphatic No adneopathy. No adenopathy. No adenopathy. Musculoskeletal Adexa without tenderness or enlargement.. Digits and nails w/o clubbing, cyanosis, infection, petechiae, ischemia, or inflammatory conditions.Marland Kitchen Psychiatric Judgement and insight Intact.. No evidence of depression, anxiety, or agitation.. General Notes: on his left lateral chest wall and areas of his back he has got significant areas of superficial stage I pressure injuries which needed no sharp debridement. There is another area which is full-thickness eschar and this is going to need sharp debridement and to start off with I have used a 15 blade and made several cross hatches in this so that the Santyl ointment will be able to penetrate down to the subcutaneous tissue Integumentary (Hair, Skin) No suspicious lesions. No crepitus or fluctuance. No peri-wound warmth or erythema. No masses.Luci Bank, [name] ([id number]) Wound #1 status is Open. Original cause of wound was Pressure Injury. The wound is located on the Left Back. The wound measures 19.5cm length x 15.5cm width x 0.1cm depth; 237.387cm^2 area and 23.739cm^3 volume. The wound is limited to skin breakdown. There is no tunneling or undermining noted. There is a large amount of serous drainage noted. The wound  margin is flat and intact. There is small (1-33%) red granulation  within the wound bed. There is a large (67-100%) amount of necrotic tissue within the wound bed including Eschar and Adherent Slough. The periwound skin appearance did not exhibit: Callus, Crepitus, Excoriation, Induration, Rash, Scarring, Dry/Scaly, Maceration, Atrophie Blanche, Cyanosis, Ecchymosis, Hemosiderin Staining, Mottled, Pallor, Rubor, Erythema. Periwound temperature was noted as No Abnormality. The periwound has tenderness on palpation. Assessment Active Problems ICD-10 L89.129 - Pressure ulcer of left upper back, unspecified stage E44.0 - Moderate protein-calorie malnutrition E66.01 - Morbid (severe) obesity due to excess calories Z89.511 - Acquired absence of right leg below knee this 56 year old gentleman who has morbid obesity and history of bariatric surgery had a fall recently which resulted in a pressure injury to the left chest wall and back and after a thorough review of recommended: 1. Silvadine ointment 1% to be applied to the area where he has superficial abrasions -- is a more of carpet burns 2. The area where he is a full thickness unstageable pressure injury I have made crosshatch is and we will continue with application of Santyl daily 3. Offloading this area has been discussed with him in great detail 4. Adequate protein, vitamin A, vitamin C and zinc 5. Regular visits to the wound center Procedures Wound #1 Wound #1 is a Pressure Ulcer located on the Left Back . There was a Skin/Dermis Open Wound/Selective [phone]) debridement with total area of 48 sq cm performed by Evlyn Kanner, MD. with the following instrument(s): Blade to remove Viable and Non-Viable tissue/material including Fibrin/Slough and Subcutaneous after achieving pain control using Lidocaine 4% Topical Solution. A time out was conducted at 09:02, prior to the start of the procedure. A Minimum amount of bleeding was  controlled with Pressure. The procedure was tolerated well with a pain level of 0 throughout and a pain level of 0 following the procedure. Post Debridement Measurements: 19.5cm length x 15.5cm width x 0.1cm depth; 23.739cm^3 volume. Post debridement Stage noted as Unstageable/Unclassified. [name], [name] ([id number]) Character of Wound/Ulcer Post Debridement requires further debridement. Severity of Tissue Post Debridement is: Fat layer exposed. Post procedure Diagnosis Wound #1: Same as Pre-Procedure Plan Wound Cleansing: Wound #1 Right Back: Clean wound with Normal Saline. May shower with protection. Anesthetic: Wound #1 Right Back: Topical Lidocaine 4% cream applied to wound bed prior to debridement Primary Wound Dressing: Wound #1 Right Back: Santyl Ointment - on eschar areas Silvadene Cream - on raw areas Secondary Dressing: Wound #1 Right Back: ABD pad - secure with tape XtraSorb - if needed for drainage Dressing Change Frequency: Wound #1 Right Back: Change dressing every day. Follow-up Appointments: Wound #1 Right Back: Return Appointment in 1 week. Off-Loading: Wound #1 Right Back: Other: - keep pressure off left back Additional Orders / Instructions: Wound #1 Right Back: Increase protein intake. this 56 year old gentleman who has morbid obesity and history of bariatric surgery had a fall recently which resulted in a pressure injury to the left chest wall and back and after a thorough review of recommended: 1. Silvadine ointment 1% to be applied to the area where he has superficial abrasions -- is a more of carpet burns [name], [name] ([id number]) 2. The area where he is a full thickness unstageable pressure injury I have made crosshatch is and we will continue with application of Santyl daily 3. Offloading this area has been discussed with him in great detail 4. Adequate protein, vitamin A, vitamin C and zinc 5. Regular visits to the wound center Electronic  Signature(s) Signed: 06/05/2016 9:48:28 AM By: Evlyn Kanner MD,  FACS Entered By: Evlyn Kanner on 06/05/2016 09:48:28 [name] ([id number]) -------------------------------------------------------------------------------- ROS/PFSH Details Patient Name: [name] Date of Service: 06/05/2016 8:00 AM Medical Record Number: [id number] Patient Account Number: [id number] Date of Birth/Sex: Jul 11, 1960 (56 y.o. Female) Treating RN: Curtis Sites Primary Care Provider: SYSTEM, PCP Other Clinician: Referring Provider: Einar Crow Treating Provider/Extender: Rudene Re in Treatment: 0 Information Obtained From Patient Wound History Do you currently have one or more open woundso Yes How many open wounds do you currently haveo 1 Approximately how long have you had your woundso March 26 How have you been treating your wound(s) until nowo santyl Has your wound(s) ever healed and then re-openedo No Have you had any lab work done in the past montho No Have you tested positive for an antibiotic resistant organism (MRSA, VRE)o No Have you tested positive for osteomyelitis (bone infection)o No Have you had any tests for circulation on your legso Yes Who ordered the testo PCP Where was the test [facility] Genitourinary Complaints and Symptoms: No Complaints or Symptoms Complaints and Symptoms: Negative for: Kidney failure/ Dialysis; Incontinence/dribbling Medical History: Negative for: End Stage Renal Disease Integumentary (Skin) Complaints and Symptoms: No Complaints or Symptoms Complaints and Symptoms: Negative for: Wounds; Bleeding or bruising tendency; Breakdown; Swelling Medical History: Negative for: History of Burn; History of pressure wounds Musculoskeletal Complaints and Symptoms: No Complaints or Symptoms [name], [name] ([id number]) Complaints and Symptoms: Negative for: Muscle Pain; Muscle Weakness Medical History: Negative for: Gout; Rheumatoid Arthritis;  Osteoarthritis; Osteomyelitis Past Medical History Notes: R BKA Psychiatric Complaints and Symptoms: No Complaints or Symptoms Complaints and Symptoms: Negative for: Anxiety; Claustrophobia Medical History: Negative for: Anorexia/bulimia; Confinement Anxiety Constitutional Symptoms (General Health) Complaints and Symptoms: No Complaints or Symptoms Eyes Complaints and Symptoms: No Complaints or Symptoms Medical History: Negative for: Cataracts; Glaucoma; Optic Neuritis Ear/Nose/Mouth/Throat Complaints and Symptoms: No Complaints or Symptoms Medical History: Negative for: Chronic sinus problems/congestion; Middle ear problems Hematologic/Lymphatic Complaints and Symptoms: No Complaints or Symptoms Medical History: Positive for: Anemia Negative for: Hemophilia; Human Immunodeficiency Virus; Lymphedema; Sickle Cell Disease Respiratory [name], [name] ([id number]) Complaints and Symptoms: No Complaints or Symptoms Medical History: Positive for: Sleep Apnea Negative for: Aspiration; Asthma; Chronic Obstructive Pulmonary Disease (COPD); Pneumothorax; Tuberculosis Cardiovascular Complaints and Symptoms: No Complaints or Symptoms Medical History: Positive for: Hypertension Negative for: Angina; Arrhythmia; Congestive Heart Failure; Coronary Artery Disease; Deep Vein Thrombosis; Hypotension; Myocardial Infarction; Peripheral Arterial Disease; Peripheral Venous Disease; Phlebitis; Vasculitis Gastrointestinal Complaints and Symptoms: No Complaints or Symptoms Medical History: Negative for: Cirrhosis ; Colitis; Crohnos; Hepatitis A; Hepatitis B; Hepatitis C Past Medical History Notes: 2006 - weight loss surgery Endocrine Complaints and Symptoms: No Complaints or Symptoms Medical History: Positive for: Type II Diabetes Time with diabetes: no longer DMII after weight loss su Blood sugar tested every day: No Immunological Complaints and Symptoms: No Complaints or  Symptoms Medical History: Negative for: Lupus Erythematosus; Raynaudos; Scleroderma Neurologic Complaints and Symptoms: No Complaints or Symptoms [name], [name] ([id number]) Medical History: Negative for: Dementia; Neuropathy; Quadriplegia; Paraplegia; Seizure Disorder Oncologic Complaints and Symptoms: No Complaints or Symptoms Medical History: Negative for: Received Chemotherapy; Received Radiation Immunizations Pneumococcal Vaccine: Received Pneumococcal Vaccination: Yes Immunization Notes: up to date Family and Social History Never smoker; Marital Status - Single; Alcohol Use: Never; Drug Use: No History; Caffeine Use: Rarely; Financial Concerns: No; Food, Clothing or Shelter Needs: No; Support System Lacking: No; Transportation Concerns: No; Advanced Directives: No; Patient does not want information on Advanced Directives Physician Affirmation I have reviewed and  agree with the above information. Electronic Signature(s) Signed: 06/05/2016 2:17:24 PM By: Evlyn Kanner MD, FACS Signed: 06/05/2016 4:55:58 PM By: Curtis Sites Entered By: Evlyn Kanner on 06/05/2016 08:48:26 [name] ([id number]) -------------------------------------------------------------------------------- SuperBill Details Patient Name: [name] Date of Service: 06/05/2016 Medical Record Number: [id number] Patient Account Number: [id number] Date of Birth/Sex: [birth year] (56 y.o. Female) Treating RN: Curtis Sites Primary Care Provider: SYSTEM, PCP Other Clinician: Referring Provider: Einar Crow Treating Provider/Extender: Rudene Re in Treatment: 0 Diagnosis Coding ICD-10 Codes Code Description [phone] Pressure ulcer of left upper back, unspecified stage E44.0 Moderate protein-calorie malnutrition E66.01 Morbid (severe) obesity due to excess calories Z89.511 Acquired absence of right leg below knee Facility Procedures CPT4 Code: 21308657 Description: 97597 - DEBRIDE WOUND 1ST 20  SQ CM OR < ICD-10 Description Diagnosis L89.129 Pressure ulcer of left upper back, unspecified st 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.0 Moderate protein-calorie malnutrition E66.01 Morbid (severe) obesity due to excess calories Z89.511 Acquired  absence of right leg below knee Modifier: age Quantity: 1 CPT4 Code: 84696295 Description: 28413 - DEBRIDE WOUND EA ADDL 20 SQ CM ICD-10 Description Diagnosis L89.129 Pressure ulcer of left upper back, unspecified st 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.0 Moderate protein-calorie malnutrition E66.01 Morbid (severe) obesity due to excess calories Z89.511 Acquired  absence of right leg below knee Modifier: age Quantity: 2 Physician Procedures CPT4 Code: 2440102 [name] Description: [phone] - WC PHYS LEVEL 4 - NEW PT ICD-10 Description Diagnosis L89.129 Pressure ulcer of left upper back, unspecified sta 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

## 2016-06-12 ENCOUNTER — Encounter: Payer: 59 | Admitting: Surgery

## 2016-06-12 DIAGNOSIS — L89129 Pressure ulcer of left upper back, unspecified stage: Secondary | ICD-10-CM | POA: Diagnosis not present

## 2016-06-13 ENCOUNTER — Inpatient Hospital Stay
Admission: EM | Admit: 2016-06-13 | Discharge: 2016-06-24 | DRG: 441 | Disposition: E | Payer: 59 | Attending: Internal Medicine | Admitting: Internal Medicine

## 2016-06-13 ENCOUNTER — Inpatient Hospital Stay: Payer: 59

## 2016-06-13 ENCOUNTER — Encounter: Payer: Self-pay | Admitting: Emergency Medicine

## 2016-06-13 ENCOUNTER — Emergency Department: Payer: 59

## 2016-06-13 ENCOUNTER — Inpatient Hospital Stay
Admit: 2016-06-13 | Discharge: 2016-06-13 | Disposition: A | Discharge status: Home / Self Care | Payer: 59 | Attending: Internal Medicine | Admitting: Internal Medicine

## 2016-06-13 DIAGNOSIS — Z466 Encounter for fitting and adjustment of urinary device: Secondary | ICD-10-CM | POA: Diagnosis not present

## 2016-06-13 DIAGNOSIS — I5031 Acute diastolic (congestive) heart failure: Secondary | ICD-10-CM | POA: Diagnosis present

## 2016-06-13 DIAGNOSIS — D696 Thrombocytopenia, unspecified: Secondary | ICD-10-CM | POA: Diagnosis not present

## 2016-06-13 DIAGNOSIS — J189 Pneumonia, unspecified organism: Secondary | ICD-10-CM | POA: Diagnosis not present

## 2016-06-13 DIAGNOSIS — G4733 Obstructive sleep apnea (adult) (pediatric): Secondary | ICD-10-CM | POA: Diagnosis present

## 2016-06-13 DIAGNOSIS — N170 Acute kidney failure with tubular necrosis: Secondary | ICD-10-CM | POA: Diagnosis present

## 2016-06-13 DIAGNOSIS — K219 Gastro-esophageal reflux disease without esophagitis: Secondary | ICD-10-CM | POA: Diagnosis present

## 2016-06-13 DIAGNOSIS — R5381 Other malaise: Secondary | ICD-10-CM | POA: Diagnosis present

## 2016-06-13 DIAGNOSIS — Z79899 Other long term (current) drug therapy: Secondary | ICD-10-CM

## 2016-06-13 DIAGNOSIS — K7290 Hepatic failure, unspecified without coma: Secondary | ICD-10-CM | POA: Diagnosis present

## 2016-06-13 DIAGNOSIS — R10819 Abdominal tenderness, unspecified site: Secondary | ICD-10-CM

## 2016-06-13 DIAGNOSIS — K9589 Other complications of other bariatric procedure: Secondary | ICD-10-CM | POA: Diagnosis present

## 2016-06-13 DIAGNOSIS — R6521 Severe sepsis with septic shock: Secondary | ICD-10-CM | POA: Diagnosis present

## 2016-06-13 DIAGNOSIS — Z978 Presence of other specified devices: Secondary | ICD-10-CM

## 2016-06-13 DIAGNOSIS — N179 Acute kidney failure, unspecified: Secondary | ICD-10-CM

## 2016-06-13 DIAGNOSIS — Z4659 Encounter for fitting and adjustment of other gastrointestinal appliance and device: Secondary | ICD-10-CM

## 2016-06-13 DIAGNOSIS — F419 Anxiety disorder, unspecified: Secondary | ICD-10-CM | POA: Diagnosis present

## 2016-06-13 DIAGNOSIS — E43 Unspecified severe protein-calorie malnutrition: Secondary | ICD-10-CM | POA: Diagnosis present

## 2016-06-13 DIAGNOSIS — Z993 Dependence on wheelchair: Secondary | ICD-10-CM

## 2016-06-13 DIAGNOSIS — Z7189 Other specified counseling: Secondary | ICD-10-CM | POA: Diagnosis not present

## 2016-06-13 DIAGNOSIS — Z66 Do not resuscitate: Secondary | ICD-10-CM | POA: Diagnosis present

## 2016-06-13 DIAGNOSIS — E8809 Other disorders of plasma-protein metabolism, not elsewhere classified: Secondary | ICD-10-CM | POA: Diagnosis present

## 2016-06-13 DIAGNOSIS — Z9049 Acquired absence of other specified parts of digestive tract: Secondary | ICD-10-CM

## 2016-06-13 DIAGNOSIS — J9622 Acute and chronic respiratory failure with hypercapnia: Secondary | ICD-10-CM | POA: Diagnosis present

## 2016-06-13 DIAGNOSIS — J9621 Acute and chronic respiratory failure with hypoxia: Secondary | ICD-10-CM | POA: Diagnosis present

## 2016-06-13 DIAGNOSIS — F329 Major depressive disorder, single episode, unspecified: Secondary | ICD-10-CM | POA: Diagnosis present

## 2016-06-13 DIAGNOSIS — D72829 Elevated white blood cell count, unspecified: Secondary | ICD-10-CM | POA: Diagnosis not present

## 2016-06-13 DIAGNOSIS — R401 Stupor: Secondary | ICD-10-CM

## 2016-06-13 DIAGNOSIS — R188 Other ascites: Secondary | ICD-10-CM

## 2016-06-13 DIAGNOSIS — Z7982 Long term (current) use of aspirin: Secondary | ICD-10-CM

## 2016-06-13 DIAGNOSIS — K912 Postsurgical malabsorption, not elsewhere classified: Secondary | ICD-10-CM | POA: Diagnosis present

## 2016-06-13 DIAGNOSIS — H5702 Anisocoria: Secondary | ICD-10-CM

## 2016-06-13 DIAGNOSIS — Z87440 Personal history of urinary (tract) infections: Secondary | ICD-10-CM

## 2016-06-13 DIAGNOSIS — Z01818 Encounter for other preprocedural examination: Secondary | ICD-10-CM

## 2016-06-13 DIAGNOSIS — E2740 Unspecified adrenocortical insufficiency: Secondary | ICD-10-CM | POA: Diagnosis present

## 2016-06-13 DIAGNOSIS — A419 Sepsis, unspecified organism: Secondary | ICD-10-CM | POA: Diagnosis present

## 2016-06-13 DIAGNOSIS — K567 Ileus, unspecified: Secondary | ICD-10-CM | POA: Diagnosis not present

## 2016-06-13 DIAGNOSIS — R4189 Other symptoms and signs involving cognitive functions and awareness: Secondary | ICD-10-CM | POA: Diagnosis not present

## 2016-06-13 DIAGNOSIS — J9600 Acute respiratory failure, unspecified whether with hypoxia or hypercapnia: Secondary | ICD-10-CM | POA: Diagnosis not present

## 2016-06-13 DIAGNOSIS — G9340 Encephalopathy, unspecified: Secondary | ICD-10-CM

## 2016-06-13 DIAGNOSIS — Z9884 Bariatric surgery status: Secondary | ICD-10-CM

## 2016-06-13 DIAGNOSIS — Y95 Nosocomial condition: Secondary | ICD-10-CM | POA: Diagnosis present

## 2016-06-13 DIAGNOSIS — Z515 Encounter for palliative care: Secondary | ICD-10-CM | POA: Diagnosis not present

## 2016-06-13 DIAGNOSIS — E871 Hypo-osmolality and hyponatremia: Secondary | ICD-10-CM | POA: Diagnosis present

## 2016-06-13 DIAGNOSIS — J9690 Respiratory failure, unspecified, unspecified whether with hypoxia or hypercapnia: Secondary | ICD-10-CM

## 2016-06-13 DIAGNOSIS — R4182 Altered mental status, unspecified: Secondary | ICD-10-CM | POA: Diagnosis present

## 2016-06-13 DIAGNOSIS — E1140 Type 2 diabetes mellitus with diabetic neuropathy, unspecified: Secondary | ICD-10-CM | POA: Diagnosis present

## 2016-06-13 DIAGNOSIS — R34 Anuria and oliguria: Secondary | ICD-10-CM | POA: Diagnosis present

## 2016-06-13 DIAGNOSIS — I110 Hypertensive heart disease with heart failure: Secondary | ICD-10-CM | POA: Diagnosis present

## 2016-06-13 DIAGNOSIS — E1151 Type 2 diabetes mellitus with diabetic peripheral angiopathy without gangrene: Secondary | ICD-10-CM | POA: Diagnosis present

## 2016-06-13 DIAGNOSIS — I10 Essential (primary) hypertension: Secondary | ICD-10-CM | POA: Diagnosis present

## 2016-06-13 DIAGNOSIS — F649 Gender identity disorder, unspecified: Secondary | ICD-10-CM | POA: Diagnosis present

## 2016-06-13 DIAGNOSIS — Z833 Family history of diabetes mellitus: Secondary | ICD-10-CM

## 2016-06-13 DIAGNOSIS — E8770 Fluid overload, unspecified: Secondary | ICD-10-CM | POA: Diagnosis present

## 2016-06-13 DIAGNOSIS — Z79891 Long term (current) use of opiate analgesic: Secondary | ICD-10-CM

## 2016-06-13 DIAGNOSIS — E7220 Disorder of urea cycle metabolism, unspecified: Secondary | ICD-10-CM

## 2016-06-13 DIAGNOSIS — R601 Generalized edema: Secondary | ICD-10-CM

## 2016-06-13 DIAGNOSIS — I878 Other specified disorders of veins: Secondary | ICD-10-CM | POA: Diagnosis present

## 2016-06-13 DIAGNOSIS — D509 Iron deficiency anemia, unspecified: Secondary | ICD-10-CM | POA: Diagnosis present

## 2016-06-13 DIAGNOSIS — E1165 Type 2 diabetes mellitus with hyperglycemia: Secondary | ICD-10-CM | POA: Diagnosis present

## 2016-06-13 DIAGNOSIS — K7200 Acute and subacute hepatic failure without coma: Principal | ICD-10-CM

## 2016-06-13 DIAGNOSIS — Z6838 Body mass index (BMI) 38.0-38.9, adult: Secondary | ICD-10-CM

## 2016-06-13 DIAGNOSIS — J9601 Acute respiratory failure with hypoxia: Secondary | ICD-10-CM | POA: Diagnosis not present

## 2016-06-13 DIAGNOSIS — L89150 Pressure ulcer of sacral region, unstageable: Secondary | ICD-10-CM | POA: Diagnosis present

## 2016-06-13 DIAGNOSIS — I4581 Long QT syndrome: Secondary | ICD-10-CM | POA: Diagnosis present

## 2016-06-13 DIAGNOSIS — G92 Toxic encephalopathy: Secondary | ICD-10-CM | POA: Diagnosis present

## 2016-06-13 DIAGNOSIS — Z9181 History of falling: Secondary | ICD-10-CM

## 2016-06-13 DIAGNOSIS — E860 Dehydration: Secondary | ICD-10-CM

## 2016-06-13 DIAGNOSIS — L89320 Pressure ulcer of left buttock, unstageable: Secondary | ICD-10-CM | POA: Diagnosis present

## 2016-06-13 DIAGNOSIS — K7682 Hepatic encephalopathy: Secondary | ICD-10-CM

## 2016-06-13 DIAGNOSIS — Z89511 Acquired absence of right leg below knee: Secondary | ICD-10-CM

## 2016-06-13 DIAGNOSIS — I959 Hypotension, unspecified: Secondary | ICD-10-CM | POA: Diagnosis present

## 2016-06-13 DIAGNOSIS — E876 Hypokalemia: Secondary | ICD-10-CM | POA: Diagnosis not present

## 2016-06-13 DIAGNOSIS — R140 Abdominal distension (gaseous): Secondary | ICD-10-CM

## 2016-06-13 DIAGNOSIS — G8929 Other chronic pain: Secondary | ICD-10-CM | POA: Diagnosis present

## 2016-06-13 LAB — URINALYSIS, COMPLETE (UACMP) WITH MICROSCOPIC
BILIRUBIN URINE: NEGATIVE
Glucose, UA: NEGATIVE mg/dL
HGB URINE DIPSTICK: NEGATIVE
Ketones, ur: NEGATIVE mg/dL
LEUKOCYTES UA: NEGATIVE
Nitrite: NEGATIVE
PROTEIN: NEGATIVE mg/dL
RBC / HPF: NONE SEEN RBC/hpf (ref 0–5)
Specific Gravity, Urine: 1.014 (ref 1.005–1.030)
pH: 5 (ref 5.0–8.0)

## 2016-06-13 LAB — PROCALCITONIN: PROCALCITONIN: 0.89 ng/mL

## 2016-06-13 LAB — PROTIME-INR
INR: 1.83
Prothrombin Time: 21.4 seconds — ABNORMAL HIGH (ref 11.4–15.2)

## 2016-06-13 LAB — AMMONIA: AMMONIA: 183 umol/L — AB (ref 9–35)

## 2016-06-13 LAB — TSH: TSH: 6.724 u[IU]/mL — ABNORMAL HIGH (ref 0.350–4.500)

## 2016-06-13 LAB — GLUCOSE, CAPILLARY
GLUCOSE-CAPILLARY: 160 mg/dL — AB (ref 65–99)
Glucose-Capillary: 82 mg/dL (ref 65–99)
Glucose-Capillary: 82 mg/dL (ref 65–99)

## 2016-06-13 LAB — MRSA PCR SCREENING: MRSA by PCR: NEGATIVE

## 2016-06-13 LAB — TYPE AND SCREEN
ABO/RH(D): O POS
Antibody Screen: NEGATIVE

## 2016-06-13 LAB — TROPONIN I: TROPONIN I: 0.03 ng/mL — AB (ref <0.03)

## 2016-06-13 LAB — MAGNESIUM: MAGNESIUM: 1.6 mg/dL — AB (ref 1.7–2.4)

## 2016-06-13 LAB — C DIFFICILE QUICK SCREEN W PCR REFLEX
C Diff antigen: NEGATIVE
C Diff interpretation: NOT DETECTED
C Diff toxin: NEGATIVE

## 2016-06-13 LAB — LACTIC ACID, PLASMA: LACTIC ACID, VENOUS: 1.6 mmol/L (ref 0.5–1.9)

## 2016-06-13 LAB — INFLUENZA PANEL BY PCR (TYPE A & B)
INFLAPCR: NEGATIVE
INFLBPCR: NEGATIVE

## 2016-06-13 MED ORDER — VANCOMYCIN HCL IN DEXTROSE 1-5 GM/200ML-% IV SOLN
1000.0000 mg | Freq: Once | INTRAVENOUS | Status: AC
  Administered 2016-06-13: 1000 mg via INTRAVENOUS
  Filled 2016-06-13: qty 200

## 2016-06-13 MED ORDER — LACTULOSE 10 GM/15ML PO SOLN
30.0000 g | Freq: Once | ORAL | Status: AC
  Administered 2016-06-13: 30 g via ORAL
  Filled 2016-06-13: qty 60

## 2016-06-13 MED ORDER — SODIUM CHLORIDE 0.9% FLUSH
10.0000 mL | INTRAVENOUS | Status: DC | PRN
  Administered 2016-06-17: 10 mL
  Filled 2016-06-13: qty 40

## 2016-06-13 MED ORDER — PANTOPRAZOLE SODIUM 40 MG PO TBEC: 40.0000 mg | DELAYED_RELEASE_TABLET | Freq: Every day | ORAL | Status: DC

## 2016-06-13 MED ORDER — CEFEPIME HCL 2 G IJ SOLR
2.0000 g | Freq: Two times a day (BID) | INTRAMUSCULAR | Status: DC
  Administered 2016-06-13 – 2016-06-16 (×6): 2 g via INTRAVENOUS
  Filled 2016-06-13 (×8): qty 2

## 2016-06-13 MED ORDER — LACTULOSE 10 GM/15ML PO SOLN: 30.0000 g | Freq: Two times a day (BID) | ORAL | Status: DC

## 2016-06-13 MED ORDER — FOLIC ACID 1 MG PO TABS: 1.0000 mg | ORAL_TABLET | Freq: Every day | ORAL | Status: DC

## 2016-06-13 MED ORDER — MAGNESIUM SULFATE 2 GM/50ML IV SOLN
2.0000 g | Freq: Once | INTRAVENOUS | Status: AC
  Administered 2016-06-13: 2 g via INTRAVENOUS
  Filled 2016-06-13: qty 50

## 2016-06-13 MED ORDER — CALAMINE EX LOTN
1.0000 | TOPICAL_LOTION | Freq: Three times a day (TID) | CUTANEOUS | Status: DC
Start: 2016-06-13 — End: 2016-06-21
  Administered 2016-06-13 – 2016-06-20 (×21): 1 via TOPICAL
  Filled 2016-06-13: qty 118

## 2016-06-13 MED ORDER — LACTULOSE 10 GM/15ML PO SOLN
30.0000 g | Freq: Two times a day (BID) | ORAL | Status: DC
  Filled 2016-06-13: qty 60

## 2016-06-13 MED ORDER — RIFAXIMIN 550 MG PO TABS
550.0000 mg | ORAL_TABLET | Freq: Two times a day (BID) | ORAL | Status: DC
  Filled 2016-06-13: qty 1

## 2016-06-13 MED ORDER — SODIUM CHLORIDE 0.9 % IV SOLN: 250.0000 mL | INTRAVENOUS | Status: DC | PRN

## 2016-06-13 MED ORDER — VANCOMYCIN HCL 10 G IV SOLR
1250.0000 mg | INTRAVENOUS | Status: DC
  Administered 2016-06-13 – 2016-06-15 (×3): 1250 mg via INTRAVENOUS
  Filled 2016-06-13 (×5): qty 1250

## 2016-06-13 MED ORDER — ADULT MULTIVITAMIN W/MINERALS CH: 1.0000 | ORAL_TABLET | Freq: Every day | ORAL | Status: DC

## 2016-06-13 MED ORDER — INSULIN ASPART 100 UNIT/ML ~~LOC~~ SOLN: 0.0000 [IU] | Freq: Three times a day (TID) | SUBCUTANEOUS | Status: DC

## 2016-06-13 MED ORDER — LACTATED RINGERS IV SOLN
INTRAVENOUS | Status: DC
  Administered 2016-06-13: 13:00:00 via INTRAVENOUS

## 2016-06-13 MED ORDER — RIFAXIMIN 550 MG PO TABS: 550.0000 mg | ORAL_TABLET | Freq: Two times a day (BID) | ORAL | Status: DC

## 2016-06-13 MED ORDER — COLLAGENASE 250 UNIT/GM EX OINT
TOPICAL_OINTMENT | Freq: Every day | CUTANEOUS | Status: DC
  Administered 2016-06-13: 1 via TOPICAL
  Administered 2016-06-14 – 2016-06-19 (×3): via TOPICAL
  Filled 2016-06-13 (×2): qty 30

## 2016-06-13 MED ORDER — SODIUM CHLORIDE 0.9% FLUSH
3.0000 mL | Freq: Two times a day (BID) | INTRAVENOUS | Status: DC
  Administered 2016-06-13 (×2): 3 mL via INTRAVENOUS

## 2016-06-13 MED ORDER — SENNOSIDES 8.8 MG/5ML PO SYRP: 10.0000 mL | ORAL_SOLUTION | Freq: Every day | ORAL | Status: DC | PRN

## 2016-06-13 MED ORDER — NOREPINEPHRINE 4 MG/250ML-% IV SOLN
0.0000 ug/min | INTRAVENOUS | Status: DC
  Administered 2016-06-13: 2 ug/min via INTRAVENOUS
  Administered 2016-06-14: 15 ug/min via INTRAVENOUS
  Administered 2016-06-14: 20 ug/min via INTRAVENOUS
  Filled 2016-06-13 (×3): qty 250

## 2016-06-13 MED ORDER — ENOXAPARIN SODIUM 40 MG/0.4ML ~~LOC~~ SOLN
30.0000 mg | SUBCUTANEOUS | Status: DC
  Administered 2016-06-13 – 2016-06-14 (×2): 30 mg via SUBCUTANEOUS
  Filled 2016-06-13 (×2): qty 0.4

## 2016-06-13 MED ORDER — LACTULOSE 10 GM/15ML PO SOLN
30.0000 g | Freq: Two times a day (BID) | ORAL | Status: DC
  Administered 2016-06-13 – 2016-06-19 (×12): 30 g
  Filled 2016-06-13 (×7): qty 60
  Filled 2016-06-13: qty 30
  Filled 2016-06-13 (×5): qty 60

## 2016-06-13 MED ORDER — ONDANSETRON HCL 4 MG PO TABS: 4.0000 mg | ORAL_TABLET | Freq: Four times a day (QID) | ORAL | Status: DC | PRN

## 2016-06-13 MED ORDER — CITALOPRAM HYDROBROMIDE 20 MG PO TABS: 10.0000 mg | ORAL_TABLET | Freq: Every day | ORAL | Status: DC

## 2016-06-13 MED ORDER — DULOXETINE HCL 20 MG PO CPEP
20.0000 mg | ORAL_CAPSULE | Freq: Every day | ORAL | Status: DC
  Filled 2016-06-13: qty 1

## 2016-06-13 MED ORDER — DONEPEZIL HCL 5 MG PO TABS: 10.0000 mg | ORAL_TABLET | Freq: Every day | ORAL | Status: DC

## 2016-06-13 MED ORDER — SODIUM CHLORIDE 0.9 % IV BOLUS (SEPSIS)
1000.0000 mL | Freq: Once | INTRAVENOUS | Status: AC
  Administered 2016-06-13: 1000 mL via INTRAVENOUS

## 2016-06-13 MED ORDER — ADULT MULTIVITAMIN LIQUID CH
15.0000 mL | Freq: Every day | ORAL | Status: DC
  Filled 2016-06-13 (×2): qty 15

## 2016-06-13 MED ORDER — BUSPIRONE HCL 10 MG PO TABS: 15.0000 mg | ORAL_TABLET | Freq: Two times a day (BID) | ORAL | Status: DC

## 2016-06-13 MED ORDER — LEVOTHYROXINE SODIUM 50 MCG PO TABS: 200.0000 ug | ORAL_TABLET | ORAL | Status: DC

## 2016-06-13 MED ORDER — VITAMIN D (ERGOCALCIFEROL) 1.25 MG (50000 UNIT) PO CAPS: 50000.0000 [IU] | ORAL_CAPSULE | ORAL | Status: DC

## 2016-06-13 MED ORDER — PANTOPRAZOLE SODIUM 40 MG IV SOLR
40.0000 mg | INTRAVENOUS | Status: DC
  Administered 2016-06-13 – 2016-06-20 (×8): 40 mg via INTRAVENOUS
  Filled 2016-06-13 (×8): qty 40

## 2016-06-13 MED ORDER — STERILE WATER FOR INJECTION IJ SOLN
INTRAMUSCULAR | Status: AC
  Administered 2016-06-13: 10 mL
  Filled 2016-06-13: qty 10

## 2016-06-13 MED ORDER — PREMIER PROTEIN SHAKE: 11.0000 [oz_av] | Freq: Four times a day (QID) | ORAL | Status: DC

## 2016-06-13 MED ORDER — PREMIER PROTEIN SHAKE
11.0000 [oz_av] | Freq: Four times a day (QID) | ORAL | Status: DC
  Administered 2016-06-13: 11 [oz_av] via NASOGASTRIC

## 2016-06-13 MED ORDER — DEXTROSE 5 % IV SOLN
2.0000 g | Freq: Once | INTRAVENOUS | Status: AC
  Administered 2016-06-13: 2 g via INTRAVENOUS
  Filled 2016-06-13: qty 2

## 2016-06-13 MED ORDER — LEVOTHYROXINE SODIUM 100 MCG IV SOLR
100.0000 ug | Freq: Every day | INTRAVENOUS | Status: DC
  Administered 2016-06-13 – 2016-06-21 (×9): 100 ug via INTRAVENOUS
  Filled 2016-06-13 (×9): qty 5

## 2016-06-13 MED ORDER — SODIUM CHLORIDE 0.9% FLUSH: 3.0000 mL | INTRAVENOUS | Status: DC | PRN

## 2016-06-13 MED ORDER — PREMIER PROTEIN SHAKE
11.0000 [oz_av] | Freq: Four times a day (QID) | ORAL | Status: DC
  Administered 2016-06-13: 11 [oz_av] via ORAL

## 2016-06-13 MED ORDER — ALBUMIN HUMAN 25 % IV SOLN
25.0000 g | Freq: Four times a day (QID) | INTRAVENOUS | Status: AC
  Administered 2016-06-13 – 2016-06-14 (×6): 25 g via INTRAVENOUS
  Filled 2016-06-13 (×8): qty 100

## 2016-06-13 MED ORDER — ONDANSETRON HCL 4 MG/2ML IJ SOLN
4.0000 mg | Freq: Four times a day (QID) | INTRAMUSCULAR | Status: DC | PRN
  Administered 2016-06-16: 4 mg via INTRAVENOUS
  Filled 2016-06-13: qty 2

## 2016-06-13 MED ORDER — RIFAXIMIN 550 MG PO TABS
550.0000 mg | ORAL_TABLET | Freq: Two times a day (BID) | ORAL | Status: DC
  Administered 2016-06-13 – 2016-06-21 (×16): 550 mg
  Filled 2016-06-13 (×16): qty 1

## 2016-06-13 MED ORDER — SODIUM CHLORIDE 0.9 % IV BOLUS (SEPSIS)
500.0000 mL | Freq: Once | INTRAVENOUS | Status: AC
  Administered 2016-06-13: 500 mL via INTRAVENOUS

## 2016-06-13 MED ORDER — PRO-STAT SUGAR FREE PO LIQD: 30.0000 mL | Freq: Two times a day (BID) | ORAL | Status: DC

## 2016-06-13 MED ORDER — SODIUM CHLORIDE 0.9% FLUSH
10.0000 mL | Freq: Two times a day (BID) | INTRAVENOUS | Status: DC
  Administered 2016-06-13: 10 mL
  Administered 2016-06-14: 13 mL
  Administered 2016-06-14: 10 mL
  Administered 2016-06-15: 20 mL
  Administered 2016-06-15 – 2016-06-20 (×9): 10 mL

## 2016-06-13 NOTE — Progress Notes (Signed)
This note also relates to the following rows which could not be included: BP - Cannot attach notes to unvalidated device data MAP (mmHg) - Cannot attach notes to unvalidated device data Pulse Rate - Cannot attach notes to unvalidated device data ECG Heart Rate - Cannot attach notes to unvalidated device data Resp - Cannot attach notes to unvalidated device data SpO2 - Cannot attach notes to unvalidated device data    06/10/2016 1500  Vitals  Temp (!) 95.4 F (35.2 C)  Temp Source Axillary  Oxygen Therapy  O2 Device Room Air    Warming blanket applied

## 2016-06-13 NOTE — Progress Notes (Signed)
Peripherally Inserted Central Catheter/Midline Placement  The IV Nurse has discussed with the patient and/or persons authorized to consent for the patient, the purpose of this procedure and the potential benefits and risks involved with this procedure.  The benefits include less needle sticks, lab draws from the catheter, and the patient may be discharged home with the catheter. Risks include, but not limited to, infection, bleeding, blood clot (thrombus formation), and puncture of an artery; nerve damage and irregular heartbeat and possibility to perform a PICC exchange if needed/ordered by physician.  Alternatives to this procedure were also discussed.  Bard Power PICC patient education guide, fact sheet on infection prevention and patient information card has been provided to patient /or left at bedside.    PICC/Midline Placement Documentation  PICC Triple Lumen 06/20/2016 PICC Right Basilic 40 cm 1 cm (Active)  Indication for Insertion or Continuance of Line Prolonged intravenous therapies 06/08/2016  6:39 PM  Exposed Catheter (cm) 1 cm 06/14/2016  6:39 PM  Site Assessment Clean;Dry;Intact 05/26/2016  6:39 PM  Lumen #1 Status Flushed;Saline locked;Blood return noted 06/05/2016  6:39 PM  Lumen #2 Status Flushed;Saline locked;Blood return noted 06/12/2016  6:39 PM  Lumen #3 Status Flushed;Saline locked;Blood return noted 06/08/2016  6:39 PM  Dressing Type Transparent;Securing device 05/31/2016  6:39 PM  Dressing Status Clean;Dry;Intact;Antimicrobial disc in place 06/14/2016  6:39 PM  Dressing Change Due 06/20/16 06/22/2016  6:39 PM   Consent given via phone from sister Donnita [place] 06/12/2016, 6:41 PM

## 2016-06-13 NOTE — Progress Notes (Signed)
   06/07/2016 1200  Vitals  BP (!) 76/48  MAP (mmHg) (!) 58  Pulse Rate 78  ECG Heart Rate 77  Resp 10  Oxygen Therapy  SpO2 97 %    NS bolus given

## 2016-06-13 NOTE — Progress Notes (Signed)
   2016-06-30 1800  Vitals  BP (!) 82/44  MAP (mmHg) (!) 57  Pulse Rate 71  ECG Heart Rate 74  Resp 10  Oxygen Therapy  SpO2 95 %  O2 Device Room Air     Levophed orders placed

## 2016-06-13 NOTE — ED Notes (Signed)
Spoke with Nursing Supervisor from [facility], report on pt is that on arrival to room for wound care, pt was coming in and out of conciseness and unable to stay awake or have clear speech. Baseline report for pt, per staff, A&Ox 4 "very intelligent and bright individual with clear thinking and speech." MD made aware of call and report.

## 2016-06-13 NOTE — Progress Notes (Signed)
Initial Nutrition Assessment  DOCUMENTATION CODES:   Severe malnutrition in context of chronic illness  INTERVENTION:  -Recommend Regular Diet  -Continue Premier Protein shakes QID -Pt is at risk for multiple vitamin and mineral defiencies; due to current presentation and hx, recommend checking for thiamine deficiency and supplementing accordingly. Pt also needs calcium, B-12 and may also benefit from zinc supplementation with non healing wounds. Pt with hx of Vitmain A def, likely would benefit from supplementation of this as well.    NUTRITION DIAGNOSIS:   Malnutrition related to chronic illness as evidenced by severe depletion of body fat, severe depletion of muscle mass, severe fluid accumulation.  GOAL:   Patient will meet greater than or equal to 90% of their needs   MONITOR:   PO intake, Supplement acceptance, Labs, Weight trends  REASON FOR ASSESSMENT:   Consult Assessment of nutrition requirement/status  ASSESSMENT:    56 yo female to female transgender pt admitted with AMS, pneumonia, hypotension, hyponatremia and markedly elevated ammonia level. Pt with extensive hx including gastric sleeve, biliopancreatic diversion, duodenal switch, venous stasis with right BKA, wheel chair bound, Vitamin A deficiency, Vitamin D deficiency, DM, hx of malnutrition requiring TPN.   Pt is alert, confused and slow to respond. Caregiver at beside. Pt reports appetite has been good while at [facility] since discharge at the beginning of the month. Pt reports caregivers have been bringing in Premier Protein for the pt as [facility] as not supplied this. Pt reports he takes 4 per day but on last admission indicating that he only drinks 1-2 per day  On last admission, Pt reported she weighed >400 pounds prior to bariatric surgery; current weight documented as 247 pounds, current dry weight unknown as pt with significant edema in all extremeties, abdomen. Pt with significantly more  fluid than on admission earlier this month  On last admission, Pt reporting she has 65 cm of common channel post bariatric surgery. In most cases of bariatric surgery, common channel is typically anywhere from 100-250 cm. Also reports she was told by MD that he only absorbs 10% of the fat he consumes  Nutrition-Focused physical exam completed. Difficult to assess muscle and fat stores on this visit due to severe edema. On previous exam earlier this month, pt findings were severe fat depletion, severe muscle depletion, and severe edema. Able to observe severe depletion in orbital, temporal, clavicle on this admission but all other areas difficult to assess.   Per home med list, pt has been taking 325 mg ferrous sulfate BID, MVI, Vtaimin D2 50000 units 1x week, folic acid  daily, vitamin C  Pt with multiple wounds including puncture wounds on shoulder that were present on admission several weeks ago. At that time, pt had reported that the wounds were from his cat, they are still present and do not appear healed.   Labs: reviewed Meds: MVI, lactulose, xifaxan, Vitamin D, folic acid  Diet Order:  Diet NPO time specified  Skin:  Wound (see comment) (ulceration on leg, stage III back post fall)  Last BM:  06/25/16  Height:   Ht Readings from Last 1 Encounters:  06-25-2016  (1.702 m)    Weight:   Wt Readings from Last 1 Encounters:  06-25-2016 247 lb 9.2 oz (112.3 kg)    BMI:  Body mass index is 38.78 kg/m.  Estimated Nutritional Needs:   Kcal:  2100-2400  Protein:  120-150 g  Fluid:  >/= 2 L  EDUCATION NEEDS:  No education needs identified at this time  Romelle Starcher MS, RD, LDN [phone] Pager  [phone] Weekend/On-Call Pager

## 2016-06-13 NOTE — Consult Note (Signed)
11:25 AM   [name] 10/30/1960 [id number]  Referring provider: Dr. Lenetta Quaker  CC: Difficult foley placement in critically ill patient  HPI: The patient is a 56 year old female to female transgender patient who presented to the hospital with altered mental status and sepsis. Urology was consulted for a difficult Foley catheter placement in a critically ill patient. The patient is unable to provide a adequate history at this time due to her mental status. On review of her chart, apparently she had a procedure performed in [place] as part of a gender transformation surgery. The nursing staff was unable to locate the head of her glans penis to place a Foley catheter.   PMH: Past Medical History:  Diagnosis Date  . Anemia    iron deficient  . Anxiety   . Chicken pox   . Chronic pain   . Depression   . DM type 2 (diabetes mellitus, type 2) (HCC) 2006   Essentially resolved following bariatric surgery, no meds  . History of malnutrition   . History of sleep apnea   . History of vitamin A deficiency   . HTN (hypertension)    History of; resolved following bariatric surgery  . Sleep apnea    no CPAP after losing weight  . Venous stasis     Surgical History: Past Surgical History:  Procedure Laterality Date  . APPENDECTOMY  1991  . BELOW KNEE LEG AMPUTATION Right 2014  . CHOLECYSTECTOMY  1991  . Gastric sleeve, biliopancreatic diversion, duodenal switch  2006  . HERNIA REPAIR  2009  . severe malnutrition  2014  . TONSILLECTOMY AND ADENOIDECTOMY  1967    Allergies: No Known Allergies  Family History: Family History  Problem Relation Age of Onset  . Arthritis Mother   . Diabetes Mother     Social History:  reports that he has never smoked. He has never used smokeless tobacco. He reports that he does not drink alcohol or use drugs.  ROS: 12 point ROS unable to be obtained due to patient factors                                         Physical Exam: BP (!) 94/51 (BP Location: Right Arm)   Pulse 81   Temp (!) 96.1 F (35.6 C) (Axillary)   Resp 18   Ht  (1.702 m) Comment: estimated  Wt 247 lb 9.2 oz (112.3 kg)   SpO2 100%   BMI 38.78 kg/m   Constitutional:  Alert and oriented, No acute distress. HEENT: [place] AT, moist mucus membranes.  Trachea midline, no masses. Cardiovascular: No clubbing, cyanosis, or edema. Respiratory: Normal respiratory effort, no increased work of breathing. GI: Abdomen is soft, nontender, nondistended, no abdominal masses GU: No CVA tenderness. The patient's obesity does limit complete examination. Does appear that she underwent orchiectomy. 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 Skin: No rashes, bruises or suspicious lesions. Lymph: No cervical or inguinal adenopathy. Neurologic: Grossly intact, no focal deficits, moving all 4 extremities. Psychiatric: Normal mood and affect.  Laboratory Data: Lab Results  Component Value Date   WBC 17.1 (H) 06-22-2016   HGB 10.2 (L) 2016-06-22   HCT 31.4 (L) 06-22-2016   MCV 96.5 06/22/16   PLT 317 06/22/16    Lab  Results  Component Value Date   CREATININE 1.95 (H) 06/09/2016    No results found for: PSA  No results found for: TESTOSTERONE  Lab Results  Component Value Date   HGBA1C 5.1 04/28/2015    Urinalysis    Component Value Date/Time   COLORURINE AMBER (A) 05/27/2016 0944   APPEARANCEUR HAZY (A) 05/27/2016 0944   APPEARANCEUR Clear 04/01/2014 1846   LABSPEC 1.013 05/27/2016 0944   LABSPEC 1.006 04/01/2014 1846   PHURINE 6.0 05/27/2016 0944   GLUCOSEU NEGATIVE 05/27/2016 0944   GLUCOSEU Negative 04/01/2014 1846   HGBUR SMALL (A) 05/27/2016 0944   BILIRUBINUR NEGATIVE 05/27/2016 0944   BILIRUBINUR Negative 04/01/2014 1846   KETONESUR 5 (A) 05/27/2016 0944   PROTEINUR NEGATIVE 05/27/2016 0944   NITRITE NEGATIVE  05/27/2016 0944   LEUKOCYTESUR SMALL (A) 05/27/2016 0944   LEUKOCYTESUR Negative 04/01/2014 1846   Procedure: The patient was prepped and draped usual sterile fashion. With significant pressure the buried glans penis was able to be visualized and a Foley catheter was unable to be placed on the urethral meatus with return of clear yellow urine. 10 cc was placed in the balloon. The glans was reduced into a buried fashion.   Assessment & Plan:    1. Difficulty foley catheter 2. Sepsis 3. Altered mental status -Continue Foley catheter in this critically ill patient. -Do not remove Foley catheter until the patient is back to her baseline mental status. At that time, remove Foley catheter per primary team. -Urology will sign off  Hildred Laser, MD  [facility] [address], [address] [place], [place] [address] [phone]

## 2016-06-13 NOTE — Progress Notes (Signed)
Instructed per night shift NP to evaluate Stroke swallow screen. Followed flow sheet through and per assessment patient swallowing safety in question, will discuss ordering speech swallow evaluation with NP.

## 2016-06-13 NOTE — Progress Notes (Signed)
*  PRELIMINARY RESULTS* Echocardiogram 2D Echocardiogram has been performed.  Cristela Blue 06-29-16, 4:28 PM

## 2016-06-13 NOTE — ED Triage Notes (Signed)
Pt arrived to ED from [facility], per EMS for AMS. EMS reports staff at facility found pt to be altered, repetitive speech, hypotensive and coming in and out of conciseness.Upon arrival to ED pt is drowsy, falling asleep while talking, pale in color, altered speech but able to give basic information such as DOB and name. MD at beside. Pts BP is 152/82.

## 2016-06-13 NOTE — H&P (Addendum)
[facility] [place] at [facility]   PATIENT NAME: [name]    MR#:  [id number]  DATE OF BIRTH:  [birth year]  DATE OF ADMISSION:  06/12/2016  PRIMARY CARE PHYSICIAN: Pcp Not In System   REQUESTING/REFERRING PHYSICIAN:   CHIEF COMPLAINT:   Chief Complaint  Patient presents with  . Altered Mental Status    HISTORY OF PRESENT ILLNESS: [name]  is a 56 y.o. female with a known history of Multiple medical problems including diabetes, diabetic neuropathy, iron deficiency anemia, status post gastric bypass, status post right BKA, peripheral vascular disease, recent admission for multiple rib fractures. UTI, weakness, comes back to the hospital with altered mental status, decreased blood pressure. Patient unfortunately is not able to provide much history due to significant encephalopathy. Apparently he was brought to due to altered mental status. He was noted to be hypotensive with systolic blood pressure as low as 89/49, his labs revealed acute renal failure with creatinine of 1.95, hyponatremia, markedly elevated ammonia level to 183, elevated white blood cell count is 17.1, his x-ray revealed bilateral pleural effusions, atelectasis versus infiltrates. Hospitalist services were contacted for admission  PAST MEDICAL HISTORY:   Past Medical History:  Diagnosis Date  . Anemia    iron deficient  . Anxiety   . Chicken pox   . Chronic pain   . Depression   . DM type 2 (diabetes mellitus, type 2) (HCC) 2006   Essentially resolved following bariatric surgery, no meds  . History of malnutrition   . History of sleep apnea   . History of vitamin A deficiency   . HTN (hypertension)    History of; resolved following bariatric surgery  . Sleep apnea    no CPAP after losing weight  . Venous stasis     PAST SURGICAL HISTORY: Past Surgical History:  Procedure Laterality Date  . APPENDECTOMY  1991  . BELOW KNEE LEG AMPUTATION Right 2014  . CHOLECYSTECTOMY  1991   . Gastric sleeve, biliopancreatic diversion, duodenal switch  2006  . HERNIA REPAIR  2009  . severe malnutrition  2014  . TONSILLECTOMY AND ADENOIDECTOMY  1967    SOCIAL HISTORY:  Social History  Substance Use Topics  . Smoking status: Never Smoker  . Smokeless tobacco: Never Used  . Alcohol use No    FAMILY HISTORY:  Family History  Problem Relation Age of Onset  . Arthritis Mother   . Diabetes Mother     DRUG ALLERGIES: No Known Allergies  Review of Systems  Unable to perform ROS: Mental acuity    MEDICATIONS AT HOME:  Prior to Admission medications   Medication Sig Start Date End Date Taking? Authorizing Provider  acetaminophen (TYLENOL) 325 MG tablet Take 2 tablets (650 mg total) by mouth every 6 (six) hours as needed for mild pain (or Fever >/= 101). 05/01/15  Yes Christiane Ha, MD  amLODipine (NORVASC) 2.5 MG tablet Take 2.5 mg by mouth daily.   Yes Historical Provider, MD  amoxicillin-clavulanate (AUGMENTIN) 875-125 MG tablet Take 1 tablet by mouth every 12 (twelve) hours. For cellulitis.   Yes Historical Provider, MD  aspirin EC 81 MG tablet Take 81 mg by mouth daily.   Yes Historical Provider, MD  busPIRone (BUSPAR) 15 MG tablet Take 15 mg by mouth 2 (two) times daily.   Yes Historical Provider, MD  calcium carbonate (OSCAL) 1500 (600 Ca) MG TABS tablet Take 600 mg of elemental calcium by mouth 2 (two) times daily  with a meal.   Yes Historical Provider, MD  citalopram (CELEXA) 10 MG tablet Take 10 mg by mouth daily. For depression 10/30/11  Yes Historical Provider, MD  collagenase (SANTYL) ointment Apply topically daily. 06/01/16  Yes Auburn Bilberry, MD  donepezil (ARICEPT) 10 MG tablet Take 10 mg by mouth at bedtime.   Yes Historical Provider, MD  DULoxetine (CYMBALTA) 20 MG capsule Take 20 mg by mouth daily.  12/29/14  Yes Historical Provider, MD  ergocalciferol (VITAMIN D2) 50000 units capsule Take 50,000 Units by mouth once a week.   Yes Historical Provider, MD    ferrous sulfate 325 (65 FE) MG tablet Take 325 mg by mouth 2 (two) times daily.    Yes Historical Provider, MD  folic acid (FOLVITE) 1 MG tablet Take 1 mg by mouth daily. 05/08/16  Yes Historical Provider, MD  levothyroxine (SYNTHROID, LEVOTHROID) 200 MCG tablet TAKE ONE TABLET BY MOUTH DAILY 04/05/15  Yes Tommie Sams, DO  mirtazapine (REMERON) 7.5 MG tablet Take 7.5 mg by mouth at bedtime.  12/29/14  Yes Historical Provider, MD  morphine (MS CONTIN) 15 MG 12 hr tablet Take 1 tablet (15 mg total) by mouth every 12 (twelve) hours. For pain 06/01/16  Yes Auburn Bilberry, MD  Multiple Vitamins-Minerals (DECUBI-VITE PO) Take 1 tablet by mouth daily.   Yes Historical Provider, MD  omeprazole (PRILOSEC) 20 MG capsule Take 20 mg by mouth daily.   Yes Historical Provider, MD  oseltamivir (TAMIFLU) 75 MG capsule Take 75 mg by mouth daily. 06/03/16 06/17/16 Yes Historical Provider, MD  pantoprazole (PROTONIX) 40 MG tablet Take 40 mg by mouth daily. For GERD 12/29/14  Yes Historical Provider, MD  polyethylene glycol (MIRALAX / GLYCOLAX) packet Take 17 g by mouth daily as needed for mild constipation. 06/01/16  Yes Auburn Bilberry, MD  potassium chloride SA (K-DUR,KLOR-CON) 20 MEQ tablet Take 20 mEq by mouth daily.   Yes Historical Provider, MD  simethicone (MYLICON) 80 MG chewable tablet Chew 1 tablet (80 mg total) by mouth every 6 (six) hours as needed for flatulence. 06/01/16  Yes Auburn Bilberry, MD  TORSEMIDE PO Take 30 mg by mouth 2 (two) times daily. Hold for sbp < 110   Yes Historical Provider, MD  vitamin C (ASCORBIC ACID) 500 MG tablet Take 500 mg by mouth daily. FOR 30 DAYS   STOP DATE 06/04/15   Yes Historical Provider, MD  Amino Acids-Protein Hydrolys (FEEDING SUPPLEMENT, PRO-STAT SUGAR FREE 64,) LIQD Take 30 mLs by mouth 2 (two) times daily with a meal. Stop date 06/04/15    Historical Provider, MD  ipratropium-albuterol (DUONEB) 0.5-2.5 (3) MG/3ML SOLN Take 3 mLs by nebulization every 6 (six) hours as  needed. Patient not taking: Reported on 06-23-2016 06/02/16   Auburn Bilberry, MD  oxyCODONE-acetaminophen (PERCOCET/ROXICET) 5-325 MG tablet Take 1 tablet by mouth every 4 (four) hours as needed for moderate pain or severe pain (Take 2 tablets every 4 hours as needed for severe pain). Patient not taking: Reported on 2016/06/23 06/01/16   Auburn Bilberry, MD  Pramoxine-Calamine (AVEENO ANTI-ITCH) 1-3 % CREA Apply 1 Bottle topically every 8 (eight) hours. Patient not taking: Reported on 05/27/2016 05/14/15   Donalee Citrin Ngetich, NP  protein supplement shake (PREMIER PROTEIN) LIQD Take 325 mLs (11 oz total) by mouth 4 (four) times daily. 06/01/16   Auburn Bilberry, MD      PHYSICAL EXAMINATION:   VITAL SIGNS: Blood pressure 104/60, pulse 91, temperature 98.2 F (36.8 C), temperature source Oral, resp. rate  10, weight 115.1 kg (253 lb 11.2 oz), SpO2 98 %.  GENERAL:  56 y.o.-year-old patient lying in the bed , Somnolent, in moderate distress, repeating every what they say, dry oral mucosa, somewhat uncomfortable and intermittently restless. Pale, yellowish complexion EYES: Pupils equal, round, reactive to light and accommodation. No scleral icterus. Extraocular muscles intact.  HEENT: Head atraumatic, normocephalic. Oropharynx and nasopharynx clear.  NECK:  Supple, no jugular venous distention. No thyroid enlargement, no tenderness.  LUNGS: Diminished breath sounds bilaterally, especially on the right, better air entrance on the left, no wheezing, rales,rhonchi or crepitation. No use of accessory muscles of respiration.  CARDIOVASCULAR: S1, S2 normal, distant. No murmurs, rubs, or gallops.  ABDOMEN: Soft, tender diffusely, protuberant, tight to palpation, no rebound, but voluntarily guarding was noted, distended. Bowel sounds present. Unable to assess for organomegaly or mass due to significant peripheral swelling.  EXTREMITIES: 2+ lower extremity and pedal edema, no cyanosis, or clubbing. Right BKA NEUROLOGIC:  Cranial nerves II through XII are grossly intact. Muscle strength , difficult to examine due to patient's condition, however, able to move all extremities. Sensation unable to examine. Gait not checked.  PSYCHIATRIC: The patient is somnolent, opens eyes, able to say yes and no, confused .  SKIN: Erythematous area on the left chest, left arm inner surface, no increased warmth or significant tenderness on palpation was noted  LABORATORY PANEL:   CBC  Recent Labs Lab 05/27/2016 0606  WBC 17.1*  HGB 10.2*  HCT 31.4*  PLT 317  MCV 96.5  MCH 31.3  MCHC 32.4  RDW 14.8*  LYMPHSABS 2.4  MONOABS 0.7  EOSABS 0.3  BASOSABS 0.1   ------------------------------------------------------------------------------------------------------------------  Chemistries   Recent Labs Lab 05/25/2016 0606  NA 132*  K 3.6  CL 104  CO2 22  GLUCOSE 79  BUN 22*  CREATININE 1.95*  CALCIUM 7.5*  AST 35  ALT 29  ALKPHOS 162*  BILITOT 1.2   ------------------------------------------------------------------------------------------------------------------  Cardiac Enzymes No results for input(s): TROPONINI in the last 168 hours. ------------------------------------------------------------------------------------------------------------------  RADIOLOGY: Dg Chest 2 View  Result Date: 05/29/2016 CLINICAL DATA:  Altered mental status. Repetitive speech. Hypotension. Coming in and out of consciousness. History of diabetes, hypertension, and sleep apnea. EXAM: CHEST  2 VIEW COMPARISON:  05/31/2016 FINDINGS: Shallow inspiration with linear atelectasis or infiltration in the lung bases similar prior study. Small bilateral pleural effusions. Mild cardiac enlargement. Pulmonary vascularity is normal for technique. Old bilateral rib fractures. IMPRESSION: Shallow inspiration with atelectasis or infiltration in the lung bases and small pleural effusions bilaterally. Electronically Signed   By: Burman Nieves M.D.    On: 06/19/2016 06:53    EKG: Orders placed or performed during the hospital encounter of 05/25/2016  . EKG 12-Lead  . EKG 12-Lead   EKG in emergency room revealed sinus rhythm at 92 bpm, right axis deviation, short PR, low-voltage QRS, nonspecific ST-T changes,  prolonged QT C2 561 ms   IMPRESSION AND PLAN:  Active Problems:   Anasarca   Acute hepatic encephalopathy   Acute renal failure (ARF) (HCC)   Severe protein-calorie malnutrition Lily Kocher: less than 60% of standard weight) (HCC)   Hyponatremia   Leukocytosis   Ascites   Hepatic encephalopathy (HCC)  #1. Acute hepatic encephalopathy, admitted patient to medical floor, initiate him on lactulose and Xifaxan, get speech therapist evaluation as to ensure that patient is able to swallow medications #2. Anasarca with suspected ascites and concern for spontaneous bacterial peritonitis, get ultrasound of abdomen, possible paracentesis, get pro  time INR , initiate antibiotic therapy #3. Acute renal failure, patient was given IV fluids in the emergency room, get bladder scan to rule out obstruction. Will get urinalysis #4. Hyponatremia in fluid overloaded patient, patient would benefit from spironolactone and Lasix, unfortunately, kidney function precludes this therapy at this time #5. Leukocytosis, suspected healthcare associated pneumonia, cannot rule out SBP, initiate patient on broad-spectrum antibiotic therapy, get cultures, may need a paracentesis.  #6. Severe calorie and protein malnutrition, get dietitian, palliative care involved #7. Abnormal EKG with prolonged QTC, get magnesium level checked, hold QTC prolonging medications  Medical records are reviewed and case discussed with ED provider. Management plans discussed with the patient, family and they are in agreement.  CODE STATUS: Code Status History    Date Active Date Inactive Code Status Order ID Comments User Context   05/27/2016  8:38 PM 06/02/2016  7:52 PM Full Code  [id number]  Enid Baas, MD Inpatient   04/28/2015  4:32 AM 05/02/2015 12:29 AM Full Code [id number]  Rodolph Bong, MD Inpatient   03/07/2014  9:05 PM 03/27/2014 10:36 PM Full Code [id number]  Barth Kirks Inpatient       TOTAL Critical care TIME TAKING CARE OF THIS PATIENT: 55 minutes.    Katharina Caper M.D on 06/04/2016 at 8:40 AM  Between 7am to 6pm - Pager - [phone] After 6pm go to [email or web address] - password [email or web address]  [facility]  Office  [phone]  CC: Primary care physician; Pcp Not In System

## 2016-06-13 NOTE — Plan of Care (Signed)
MD aware of troponin level

## 2016-06-13 NOTE — Progress Notes (Signed)
Pharmacy Antibiotic Note  [name] is a 56 y.o. female admitted on 06/10/2016 with pneumonia.  Pharmacy has been consulted for Vancomycin and Cefepime dosing.  Patient from [facility]. Also possible SBP? Hx: DM, s/p gastric bypass, s/p right BKA, peripheral vascular disease, recent admission for multiple rib fractures.   Plan: Cefepime 2 gram x 1 given in ER and Vancomycin 1 gram IV x 1 in ER. Will continue with stacked dosing of Vancomycin. Vancomycin 1250 IV every 18 hours.  Goal trough 15-20 mcg/mL.  Ke 0.044   t1/2 15.75   Vd 64.82   adjBW= 92.6 kg Will order Vancomycin trough prior to 5th dose on 4/22.  Will order Cefepime 2 gram IV q12h.  Watch renal fxn.    Weight: 253 lb 11.2 oz (115.1 kg)  Temp (24hrs), Avg:98.2 F (36.8 C), Min:98.2 F (36.8 C), Max:98.2 F (36.8 C)   Recent Labs Lab 06/17/2016 0606  WBC 17.1*  CREATININE 1.95*  LATICACIDVEN 1.6    Estimated Creatinine Clearance: 56.7 mL/min (A) (by C-G formula based on SCr of 1.95 mg/dL (H)).    No Known Allergies  Antimicrobials this admission: cefepime  4/20 >>   Vanc 4/20 >>    Dose adjustments this admission:    Microbiology results: 4/20 BCx: pending   UCx:      Sputum:      MRSA PCR:    [id number] inspiration with atelectasis or infiltration in the lung bases and small pleural effusions bilaterally.  Thank you for allowing pharmacy to be a part of this patient's care.  Melvie Paglia A 06/06/2016 9:18 AM

## 2016-06-13 NOTE — ED Notes (Signed)
Pt able to swallow liquids without difficulty.

## 2016-06-13 NOTE — ED Notes (Signed)
2 attempts to insert foley catheter without success. Admitting MD paged and urology consult.Marland Kitchen

## 2016-06-13 NOTE — NC FL2 (Signed)
[place] MEDICAID FL2 LEVEL OF CARE SCREENING TOOL     IDENTIFICATION  Patient Name: [name] Birthdate: 15-Oct-1960 Sex: female Admission Date (Current Location): 06/15/16  [place] and [place] Number:  Chiropodist and Address:  [facility], [address], [place], [place] [address]      Provider Number: [id number]  Attending Physician Name and Address:  Katharina Caper, MD  Relative Name and Phone Number:       Current Level of Care: Hospital Recommended Level of Care: Skilled Nursing Facility Prior Approval Number:    Date Approved/Denied:   PASRR Number:  [id number] )  Discharge Plan: SNF    Current Diagnoses: Patient Active Problem List   Diagnosis Date Noted  . Anasarca 06/15/2016  . Acute hepatic encephalopathy 15-Jun-2016  . Acute renal failure (ARF) (HCC) 06/15/2016  . Severe protein-calorie malnutrition Lily Kocher: less than 60% of standard weight) (HCC) Jun 15, 2016  . Hyponatremia 06-15-16  . Leukocytosis 2016-06-15  . Ascites June 15, 2016  . Hepatic encephalopathy (HCC) Jun 15, 2016  . Protein-calorie malnutrition, severe 05/29/2016  . Pressure injury of skin 05/28/2016  . Rib fractures 05/27/2016  . Chronic edema   . Finger fracture, right   . Benign essential HTN   . Chronic pain syndrome   . Venous stasis ulcer (HCC)   . Cellulitis 04/28/2015  . Anemia 04/28/2015  . Weakness generalized 04/28/2015  . Hypokalemia 04/28/2015  . Pressure ulcer of buttock, unstageable (HCC) 04/28/2015  . Scrotal swelling 04/28/2015  . Fall 04/28/2015  . Protein-calorie malnutrition (HCC) 04/28/2015  . Weakness 04/28/2015  . Cellulitis, scrotum   . Failure to thrive in adult   . Fungal dermatitis   . Generalized weakness   . Muscular deconditioning   . Scrotal edema   . Swelling of scrotum   . Hypothyroidism 01/17/2015  . Anemia, iron deficiency 01/17/2015  . Vitamin D deficiency 01/17/2015  . S/P bariatric surgery 01/17/2015   . DM type 2 (diabetes mellitus, type 2) (HCC) 01/17/2015  . Venous stasis ulcer of left lower extremity (HCC) 12/14/2013  . Chronic pain 07/10/2013  . 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 (HCC) 07/09/2013    Orientation RESPIRATION BLADDER Height & Weight     Self, Time, Place  Normal Continent Weight: 247 lb 9.2 oz (112.3 kg) Height:   (170.2 cm) (estimated)  BEHAVIORAL SYMPTOMS/MOOD NEUROLOGICAL BOWEL NUTRITION STATUS   (none)  (none) Incontinent Diet (Diet: NPO to be advanced )  AMBULATORY STATUS COMMUNICATION OF NEEDS Skin   Extensive Assist Verbally PU Stage and Appropriate Care, Other (Comment) (Pressure Ulcer Stage 3 on mid back. Diabetic leg ulcer on left leg. )                       Personal Care Assistance Level of Assistance  Bathing, Feeding, Dressing Bathing Assistance: Limited assistance Feeding assistance: Independent Dressing Assistance: Limited assistance     Functional Limitations Info  Sight, Hearing, Speech Sight Info: Adequate Hearing Info: Adequate Speech Info: Adequate    SPECIAL CARE FACTORS FREQUENCY  PT (By licensed PT), OT (By licensed OT)     PT Frequency:  (5) OT Frequency:  (5)            Contractures      Additional Factors Info  Code Status, Allergies, Isolation Precautions Code Status Info:  (Full Code. ) Allergies Info:  (No Known Allergies. )     Isolation Precautions Info:  (Enteric precautions. )  Current Medications (2016-07-06):  This is the current hospital active medication list Current Facility-Administered Medications  Medication Dose Route Frequency Provider Last Rate Last Dose  . 0.9 %  sodium chloride infusion  250 mL Intravenous PRN Katharina Caper, MD      . albumin human 25 % solution 25 g  25 g Intravenous Q6H Katharina Caper, MD   25 g at Jul 06, 2016 1321  . calamine lotion 1 application  1 application Topical Q8H Katharina Caper, MD   1 application at Jul 06, 2016 1235  . ceFEPIme (MAXIPIME) 2 g  in dextrose 5 % 50 mL IVPB  2 g Intravenous Q12H Katharina Caper, MD      . collagenase (SANTYL) ointment   Topical Daily Katharina Caper, MD   1 application at 07/06/2016 1234  . enoxaparin (LOVENOX) injection 30 mg  30 mg Subcutaneous Q24H Katharina Caper, MD      . feeding supplement (PRO-STAT SUGAR FREE 64) liquid 30 mL  30 mL Oral BID WC Katharina Caper, MD      . folic acid (FOLVITE) tablet 1 mg  1 mg Oral Daily Katharina Caper, MD      . lactulose (CHRONULAC) 10 GM/15ML solution 30 g  30 g Oral BID Katharina Caper, MD      . levothyroxine (SYNTHROID, LEVOTHROID) injection 100 mcg  100 mcg Intravenous Daily Merwyn Katos, MD      . magnesium sulfate IVPB 2 g 50 mL  2 g Intravenous Once Eugenie Norrie, NP      . multivitamin liquid 15 mL  15 mL Oral Daily Eugenie Norrie, NP      . ondansetron (ZOFRAN) tablet 4 mg  4 mg Oral Q6H PRN Katharina Caper, MD       Or  . ondansetron (ZOFRAN) injection 4 mg  4 mg Intravenous Q6H PRN Katharina Caper, MD      . pantoprazole (PROTONIX) injection 40 mg  40 mg Intravenous Q24H Merwyn Katos, MD   40 mg at 06-Jul-2016 1358  . protein supplement (PREMIER PROTEIN) liquid  11 oz Oral QID Katharina Caper, MD   11 oz at Jul 06, 2016 1321  . rifaximin (XIFAXAN) tablet 550 mg  550 mg Oral BID Katharina Caper, MD      . sodium chloride flush (NS) 0.9 % injection 3 mL  3 mL Intravenous Q12H Katharina Caper, MD   3 mL at 07-06-16 1232  . sodium chloride flush (NS) 0.9 % injection 3 mL  3 mL Intravenous Q12H Katharina Caper, MD   3 mL at 07/06/16 1232  . sodium chloride flush (NS) 0.9 % injection 3 mL  3 mL Intravenous PRN Katharina Caper, MD      . vancomycin (VANCOCIN) 1,250 mg in sodium chloride 0.9 % 250 mL IVPB  1,250 mg Intravenous Q18H Katharina Caper, MD      . Melene Muller ON 06/15/2016] Vitamin D (Ergocalciferol) (DRISDOL) capsule 50,000 Units  50,000 Units Oral Weekly Katharina Caper, MD         Discharge Medications: Please see discharge summary for a list of discharge  medications.  Relevant Imaging Results:  Relevant Lab Results:   Additional Information  (SSN: [id number])  Oluwademilade Kellett, Darleen Crocker, LCSW

## 2016-06-13 NOTE — Consult Note (Signed)
Consultation Note Date: 06/11/2016   Patient Name: [name]  DOB: 10-Jun-1960  MRN: [id number]  Age / Sex: 56 y.o., female  PCP: Pcp Not In System Referring Physician: Theodoro Grist, MD  Reason for Consultation: Establishing goals of care  HPI/Patient Profile: 56 y.o. female  with past medical history of venous stasis, right BKA, diabetes, depression, anxiety, anemia, chronic pain, sleep apnea, malnutrition, s/p gastric bypass, combined reduction mammaplasty with abdominoplasty (2009), and uncompleted gender reassignment surgery in [place]    admitted on 05/28/2016 with altered mental status and low blood pressure. Recent hospital admission s/p fall from wheelchair and treated for UTI, weakness, and multiple rib fractures. In ED, patient was lethargic, hypotensive, with ammonia level of 183. Also acute renal failure. Chest xray revealed bilateral pleural effusions, atelectasis vs. infiltrates. CT head negative. US abdomen reveals no ascites. Patient with acute hepatic encephalopathy. Palliative medicine consultation for goals of care.   Clinical Assessment and Goals of Care: I have reviewed medical records, discussed with care team, and met patient at bedside to discuss diagnosis, GOC, and EOL wishes. Patient awake, alert, and pleasantly confused. She is able to answer some questions appropriately. I spoke with sister, [name], via telephone after our conversation.   Introduced Palliative Medicine as specialized medical care for people living with serious illness. It focuses on providing relief from the symptoms and stress of a serious illness. The goal is to improve quality of life for both the patient and the family.  Prior to recent hospitalization, the patient was living independently in an apartment with assist from home health. Gastric bypass surgery many years ago. BKA 2 years ago. The patient has been wheelchair  bound since. On April 3, patient fell out of wheelchair and was on the ground for 10 hours with wheelchair on top. Wound on left side with multiple rib fractures. Was discharged to [facility] for rehab.   Discussed hospital diagnoses and interventions with sister, [name].   Advanced directives, concepts specific to code status, and artifical feeding and hydration were considered and discussed. Patient tells me he WOULD want resuscitation attempted and placed on life support if necessary. Educated on my recommendation for DNR/DNI with age, frailty, and chronic co-morbidities.   [name] tells me he would want sister, [name] to be his documented HCPOA. No paperwork. [name] tells me she has been faced with this decision before when he was very sick. She does not feel comfortable making decisions without input from her other sister and brother but understands [name] would want her to be HCPOA. She wants [name] to discuss EOL wishes with her and "get this in writing." Educated [name] on advanced directives/code status. [name] does not think she would want her life prolonged long term by tubes and drains. The patient has spoke of "wanting to die" if she ever lost her other leg. [name] seems realistic in the fact that this would not give her a quality of life. Encouraged [name] to continue conversations with the patient and get wishes in writing. She lives  in [place] and will likely not be able to visit the hospital this weekend.   Questions and concerns were addressed. Watchful waiting. [name] is hopeful that she will continue to improve through the weekend and return to rehab.     SUMMARY OF RECOMMENDATIONS    FULL code. Educated on my recommendation for DNR/DNI with age, frailty, and chronic co-morbidities.   Strongly encouraged sister to discuss and document advanced directives, code status, and EOL wishes with the patient.   Continue current interventions. Watchful waiting through the weekend. Hopeful for  improvement and return to rehab.   May benefit from palliative services to follow at SNF.   PMT not at [facility] over the weekend but will f/u next week if still hospitalized.   Code Status/Advance Care Planning:  Full code   Symptom Management:   Per attending  Palliative Prophylaxis:   Aspiration, Delirium Protocol, Frequent Pain Assessment, Oral Care and Turn Reposition  Additional Recommendations (Limitations, Scope, Preferences):  Full Scope Treatment  Psycho-social/Spiritual:   Desire for further Chaplaincy support:yes  Additional Recommendations: Caregiving  Support/Resources  Prognosis:   Unable to determine  Discharge Planning: To Be Determined      Primary Diagnoses: Present on Admission: . Hepatic encephalopathy ([name])   I have reviewed the medical record, interviewed the patient and family, and examined the patient. The following aspects are pertinent.  Past Medical History:  Diagnosis Date  . Anemia    iron deficient  . Anxiety   . Chicken pox   . Chronic pain   . Depression   . DM type 2 (diabetes mellitus, type 2) ([place]) 2006   Essentially resolved following bariatric surgery, no meds  . History of malnutrition   . History of sleep apnea   . History of vitamin A deficiency   . HTN (hypertension)    History of; resolved following bariatric surgery  . Sleep apnea    no CPAP after losing weight  . Venous stasis    Social History   Social History  . Marital status: Single    Spouse name: N/A  . Number of children: N/A  . Years of education: N/A   Social History Main Topics  . Smoking status: Never Smoker  . Smokeless tobacco: Never Used  . Alcohol use No  . Drug use: No  . Sexual activity: Not Asked   Other Topics Concern  . None   Social History Narrative   Lives by himself.   Uses a power wheel chair   Family History  Problem Relation Age of Onset  . Arthritis Mother   . Diabetes Mother    Scheduled Meds: . calamine  1  application Topical D4K  . collagenase   Topical Daily  . enoxaparin (LOVENOX) injection  30 mg Subcutaneous Q24H  . feeding supplement (PRO-STAT SUGAR Carson 64)  30 mL Oral BID WC  . folic acid  1 mg Oral Daily  . lactulose  30 g Oral BID  . levothyroxine  100 mcg Intravenous Daily  . multivitamin with minerals  1 tablet Oral Daily  . pantoprazole (PROTONIX) IV  40 mg Intravenous Q24H  . protein supplement shake  11 oz Oral QID  . rifaximin  550 mg Oral BID  . sodium chloride flush  3 mL Intravenous Q12H  . sodium chloride flush  3 mL Intravenous Q12H  . [START ON 06/15/2016] Vitamin D (Ergocalciferol)  50,000 Units Oral Weekly   Continuous Infusions: . sodium chloride    . albumin  human    . ceFEPIme (MAXIPIME) 2 GM IVP    . magnesium sulfate 1 - 4 g bolus IVPB    . vancomycin     PRN Meds:.sodium chloride, ondansetron **OR** ondansetron (ZOFRAN) IV, sodium chloride flush Medications Prior to Admission:  Prior to Admission medications   Medication Sig Start Date End Date Taking? Authorizing Provider  acetaminophen (TYLENOL) 325 MG tablet Take 2 tablets (650 mg total) by mouth every 6 (six) hours as needed for mild pain (or Fever >/= 101). 05/01/15  Yes Delfina Redwood, MD  amLODipine (NORVASC) 2.5 MG tablet Take 2.5 mg by mouth daily.   Yes Historical Provider, MD  amoxicillin-clavulanate (AUGMENTIN) 875-125 MG tablet Take 1 tablet by mouth every 12 (twelve) hours. For cellulitis.   Yes Historical Provider, MD  aspirin EC 81 MG tablet Take 81 mg by mouth daily.   Yes Historical Provider, MD  busPIRone (BUSPAR) 15 MG tablet Take 15 mg by mouth 2 (two) times daily.   Yes Historical Provider, MD  calcium carbonate (OSCAL) 1500 (600 Ca) MG TABS tablet Take 600 mg of elemental calcium by mouth 2 (two) times daily with a meal.   Yes Historical Provider, MD  citalopram (CELEXA) 10 MG tablet Take 10 mg by mouth daily. For depression 10/30/11  Yes Historical Provider, MD  collagenase (SANTYL)  ointment Apply topically daily. 06/01/16  Yes Dustin Flock, MD  donepezil (ARICEPT) 10 MG tablet Take 10 mg by mouth at bedtime.   Yes Historical Provider, MD  DULoxetine (CYMBALTA) 20 MG capsule Take 20 mg by mouth daily.  12/29/14  Yes Historical Provider, MD  ergocalciferol (VITAMIN D2) 50000 units capsule Take 50,000 Units by mouth once a week.   Yes Historical Provider, MD  ferrous sulfate 325 (65 FE) MG tablet Take 325 mg by mouth 2 (two) times daily.    Yes Historical Provider, MD  folic acid (FOLVITE) 1 MG tablet Take 1 mg by mouth daily. 05/08/16  Yes Historical Provider, MD  levothyroxine (SYNTHROID, LEVOTHROID) 200 MCG tablet TAKE ONE TABLET BY MOUTH DAILY 04/05/15  Yes Coral Spikes, DO  mirtazapine (REMERON) 7.5 MG tablet Take 7.5 mg by mouth at bedtime.  12/29/14  Yes Historical Provider, MD  morphine (MS CONTIN) 15 MG 12 hr tablet Take 1 tablet (15 mg total) by mouth every 12 (twelve) hours. For pain 06/01/16  Yes Dustin Flock, MD  Multiple Vitamins-Minerals (DECUBI-VITE PO) Take 1 tablet by mouth daily.   Yes Historical Provider, MD  omeprazole (PRILOSEC) 20 MG capsule Take 20 mg by mouth daily.   Yes Historical Provider, MD  oseltamivir (TAMIFLU) 75 MG capsule Take 75 mg by mouth daily. 06/03/16 06/17/16 Yes Historical Provider, MD  pantoprazole (PROTONIX) 40 MG tablet Take 40 mg by mouth daily. For GERD 12/29/14  Yes Historical Provider, MD  polyethylene glycol (MIRALAX / GLYCOLAX) packet Take 17 g by mouth daily as needed for mild constipation. 06/01/16  Yes Dustin Flock, MD  potassium chloride SA (K-DUR,KLOR-CON) 20 MEQ tablet Take 20 mEq by mouth daily.   Yes Historical Provider, MD  simethicone (MYLICON) 80 MG chewable tablet Chew 1 tablet (80 mg total) by mouth every 6 (six) hours as needed for flatulence. 06/01/16  Yes Dustin Flock, MD  TORSEMIDE PO Take 30 mg by mouth 2 (two) times daily. Hold for sbp < 110   Yes Historical Provider, MD  vitamin C (ASCORBIC ACID) 500 MG tablet Take  500 mg by mouth daily. FOR 30 DAYS  STOP DATE 06/04/15   Yes Historical Provider, MD  Amino Acids-Protein Hydrolys (FEEDING SUPPLEMENT, PRO-STAT SUGAR Carson 64,) LIQD Take 30 mLs by mouth 2 (two) times daily with a meal. Stop date 06/04/15    Historical Provider, MD  ipratropium-albuterol (DUONEB) 0.5-2.5 (3) MG/3ML SOLN Take 3 mLs by nebulization every 6 (six) hours as needed. Patient not taking: Reported on 06/15/2016 06/02/16   Dustin Flock, MD  oxyCODONE-acetaminophen (PERCOCET/ROXICET) 5-325 MG tablet Take 1 tablet by mouth every 4 (four) hours as needed for moderate pain or severe pain (Take 2 tablets every 4 hours as needed for severe pain). Patient not taking: Reported on 06/16/2016 06/01/16   Dustin Flock, MD  Pramoxine-Calamine (AVEENO ANTI-ITCH) 1-3 % CREA Apply 1 Bottle topically every 8 (eight) hours. Patient not taking: Reported on 05/27/2016 05/14/15   Nelda Bucks Ngetich, NP  protein supplement shake (PREMIER PROTEIN) LIQD Take 325 mLs (11 oz total) by mouth 4 (four) times daily. 06/01/16   Dustin Flock, MD   No Known Allergies Review of Systems  Unable to perform ROS: Mental status change   Physical Exam  Constitutional: He appears ill.  HENT:  Head: Normocephalic and atraumatic.  Cardiovascular: Regular rhythm.   Pulmonary/Chest: No accessory muscle usage. No tachypnea. No respiratory distress. He has decreased breath sounds.  Abdominal: Normal appearance.  Musculoskeletal: He exhibits edema (generalized).  Neurological: He is alert.  Pleasantly confused  Skin: Skin is warm and dry. There is pallor.  Psychiatric: His speech is delayed. Cognition and memory are impaired.  Nursing note and vitals reviewed.  Vital Signs: BP 97/85   Pulse 84   Temp (!) 95.4 F (35.2 C) (Axillary)   Resp 18   Ht _0  (1.702 m) Comment: estimated  Wt 112.3 kg (247 lb 9.2 oz)   SpO2 100%   BMI 38.78 kg/m  Pain Assessment: No/denies pain   Pain Score: 0-No pain   SpO2: SpO2: 100 % O2  Device:SpO2: 100 % O2 Flow Rate: .O2 Flow Rate (L/min): 2 L/min  IO: Intake/output summary:   Intake/Output Summary (Last 24 hours) at 05/31/2016 1532 Last data filed at 06/23/2016 1025  Gross per 24 hour  Intake             2250 ml  Output                0 ml  Net             2250 ml    LBM:   Baseline Weight: Weight: 105.7 kg (233 lb) Most recent weight: Weight: 112.3 kg (247 lb 9.2 oz)     Palliative Assessment/Data: PPS 30%   Flowsheet Rows     Most Recent Value  Intake Tab  Referral Department  Hospitalist  Unit at Time of Referral  ER  Palliative Care Primary Diagnosis  Other (Comment) [vascular]  Date Notified  06/01/2016  Palliative Care Type  New Palliative care  Reason for referral  Clarify Goals of Care  Date of Admission  05/30/2016  Date first seen by Palliative Care  05/29/2016  # of days IP prior to Palliative referral  0  Clinical Assessment  Palliative Performance Scale Score  30%  Psychosocial & Spiritual Assessment  Palliative Care Outcomes  Patient/Family meeting held?  Yes  Who was at the meeting?  patient and sister [name]) via telephone  Palliative Care Outcomes  Clarified goals of care, Provided psychosocial or spiritual support, ACP counseling assistance, Linked to palliative care logitudinal support  Time In: 1430 Time Out: 1540 Time Total: 83mn Greater than 50%  of this time was spent counseling and coordinating care related to the above assessment and plan.  Signed by:  MIhor Dow FNP-C Palliative Medicine Team  Phone: [phone]: [phone]  Please contact Palliative Medicine Team phone at [phone] questions and concerns.  For individual provider: See AShea Evans

## 2016-06-13 NOTE — Progress Notes (Signed)
Patient is from [facility] for short term rehab. Per [facility] admissions coordinator at [facility] patient can return when stable. FL2 complete.

## 2016-06-13 NOTE — Consult Note (Addendum)
Name: [name] MRN: [id number] DOB: [birth year]    ADMISSION DATE:  07-10-16 CONSULTATION DATE: 07/10/16  REFERRING MD :  Dr. Winona Legato  CHIEF COMPLAINT: Altered Mental Status   BRIEF PATIENT DESCRIPTION:  56 yo female to female transgender pt  from [facility] admitted 04/20 with altered mental status, pneumonia, hypotension acute renal failure, hyponatremia, and markedly elevated ammonia level   SIGNIFICANT EVENTS  04/20-Pt admitted to ICU   STUDIES:  CT Head 04/20>>  HISTORY OF PRESENT ILLNESS:   This is a 56 yo female to female transgender pt with a PMH of Venous Stasis, Right BKA, Wheelchair Bound, OSA-no CPAP, HTN, Vitamin A deficiency, Malnutrition, Type II DM, Depression, Chronic Pain, Anxiety, Iron Deficient Anemia, and Status post gastric bypass, 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  She presented to [facility] ER 04/20 from [facility] with altered mental status and hypotension. She was recently admitted to the hospital 04/3 and treated for multiple rib fractures post fall, UTI, and weakness she was discharged 04/9.  Per ER notes EMS stated nursing staff at [facility] stated the pt developed repetitive speech and was markedly confused in comparison to baseline and the pt was unable to maintain consciousness.  Upon arrival to the ER the pt was lethargic with altered speech, hypotension with bp 89/49 and lab results revealed creatinine 1.95, ammonia level 183, wbc 17.1, and chest xray revealed bilateral pleural effusions, atelectasis vs. infiltrates. Therefore, he was subsequently admitted by hospitalist to ICU for further workup and management.    PAST MEDICAL HISTORY :   has a past medical history of Anemia; Anxiety; Chicken pox; Chronic pain; Depression; DM type 2 (diabetes mellitus, type 2) (HCC) (2006); History of malnutrition; History of sleep apnea; History  of vitamin A deficiency; HTN (hypertension); Sleep apnea; and Venous stasis.  has a past surgical history that includes Cholecystectomy (1991); Appendectomy (1991); Tonsillectomy and adenoidectomy (1967); severe malnutrition (2014); Below knee leg amputation (Right, 2014); Hernia repair (2009); and Gastric sleeve, biliopancreatic diversion, duodenal switch (2006). Prior to Admission medications   Medication Sig Start Date End Date Taking? Authorizing Provider  acetaminophen (TYLENOL) 325 MG tablet Take 2 tablets (650 mg total) by mouth every 6 (six) hours as needed for mild pain (or Fever >/= 101). 05/01/15  Yes Christiane Ha, MD  amLODipine (NORVASC) 2.5 MG tablet Take 2.5 mg by mouth daily.   Yes Historical Provider, MD  amoxicillin-clavulanate (AUGMENTIN) 875-125 MG tablet Take 1 tablet by mouth every 12 (twelve) hours. For cellulitis.   Yes Historical Provider, MD  aspirin EC 81 MG tablet Take 81 mg by mouth daily.   Yes Historical Provider, MD  busPIRone (BUSPAR) 15 MG tablet Take 15 mg by mouth 2 (two) times daily.   Yes Historical Provider, MD  calcium carbonate (OSCAL) 1500 (600 Ca) MG TABS tablet Take 600 mg of elemental calcium by mouth 2 (two) times daily with a meal.   Yes Historical Provider, MD  citalopram (CELEXA) 10 MG tablet Take 10 mg by mouth daily. For depression 10/30/11  Yes Historical Provider, MD  collagenase (SANTYL) ointment Apply topically daily. 06/01/16  Yes Auburn Bilberry, MD  donepezil (ARICEPT) 10 MG tablet Take 10 mg by mouth at bedtime.   Yes Historical Provider, MD  DULoxetine (CYMBALTA) 20 MG capsule Take 20 mg by mouth daily.  12/29/14  Yes Historical Provider, MD  ergocalciferol (VITAMIN D2) 50000 units capsule Take  50,000 Units by mouth once a week.   Yes Historical Provider, MD  ferrous sulfate 325 (65 FE) MG tablet Take 325 mg by mouth 2 (two) times daily.    Yes Historical Provider, MD  folic acid (FOLVITE) 1 MG tablet Take 1 mg by mouth daily. 05/08/16  Yes  Historical Provider, MD  levothyroxine (SYNTHROID, LEVOTHROID) 200 MCG tablet TAKE ONE TABLET BY MOUTH DAILY 04/05/15  Yes Tommie Sams, DO  mirtazapine (REMERON) 7.5 MG tablet Take 7.5 mg by mouth at bedtime.  12/29/14  Yes Historical Provider, MD  morphine (MS CONTIN) 15 MG 12 hr tablet Take 1 tablet (15 mg total) by mouth every 12 (twelve) hours. For pain 06/01/16  Yes Auburn Bilberry, MD  Multiple Vitamins-Minerals (DECUBI-VITE PO) Take 1 tablet by mouth daily.   Yes Historical Provider, MD  omeprazole (PRILOSEC) 20 MG capsule Take 20 mg by mouth daily.   Yes Historical Provider, MD  oseltamivir (TAMIFLU) 75 MG capsule Take 75 mg by mouth daily. 06/03/16 06/17/16 Yes Historical Provider, MD  pantoprazole (PROTONIX) 40 MG tablet Take 40 mg by mouth daily. For GERD 12/29/14  Yes Historical Provider, MD  polyethylene glycol (MIRALAX / GLYCOLAX) packet Take 17 g by mouth daily as needed for mild constipation. 06/01/16  Yes Auburn Bilberry, MD  potassium chloride SA (K-DUR,KLOR-CON) 20 MEQ tablet Take 20 mEq by mouth daily.   Yes Historical Provider, MD  simethicone (MYLICON) 80 MG chewable tablet Chew 1 tablet (80 mg total) by mouth every 6 (six) hours as needed for flatulence. 06/01/16  Yes Auburn Bilberry, MD  TORSEMIDE PO Take 30 mg by mouth 2 (two) times daily. Hold for sbp < 110   Yes Historical Provider, MD  vitamin C (ASCORBIC ACID) 500 MG tablet Take 500 mg by mouth daily. FOR 30 DAYS   STOP DATE 06/04/15   Yes Historical Provider, MD  Amino Acids-Protein Hydrolys (FEEDING SUPPLEMENT, PRO-STAT SUGAR FREE 64,) LIQD Take 30 mLs by mouth 2 (two) times daily with a meal. Stop date 06/04/15    Historical Provider, MD  ipratropium-albuterol (DUONEB) 0.5-2.5 (3) MG/3ML SOLN Take 3 mLs by nebulization every 6 (six) hours as needed. Patient not taking: Reported on 14-Jun-2016 06/02/16   Auburn Bilberry, MD  oxyCODONE-acetaminophen (PERCOCET/ROXICET) 5-325 MG tablet Take 1 tablet by mouth every 4 (four) hours as needed for  moderate pain or severe pain (Take 2 tablets every 4 hours as needed for severe pain). Patient not taking: Reported on 06-14-16 06/01/16   Auburn Bilberry, MD  Pramoxine-Calamine (AVEENO ANTI-ITCH) 1-3 % CREA Apply 1 Bottle topically every 8 (eight) hours. Patient not taking: Reported on 05/27/2016 05/14/15   Donalee Citrin Ngetich, NP  protein supplement shake (PREMIER PROTEIN) LIQD Take 325 mLs (11 oz total) by mouth 4 (four) times daily. 06/01/16   Auburn Bilberry, MD   No Known Allergies  FAMILY HISTORY:  family history includes Arthritis in his mother; Diabetes in his mother. SOCIAL HISTORY:  reports that he has never smoked. He has never used smokeless tobacco. He reports that he does not drink alcohol or use drugs.  REVIEW OF SYSTEMS: Positives in BOLD  Constitutional: Negative for fever, chills, weight loss, malaise/fatigue and diaphoresis.  HENT: Negative for hearing loss, ear pain, nosebleeds, congestion, sore throat, neck pain, tinnitus and ear discharge.   Eyes: Negative for blurred vision, double vision, photophobia, pain, discharge and redness.  Respiratory: Negative for cough, hemoptysis, sputum production, shortness of breath, wheezing and stridor.   Cardiovascular: Negative for  chest pain, palpitations, orthopnea, claudication, leg swelling and PND.  Gastrointestinal: Negative for heartburn, nausea, vomiting, abdominal pain, diarrhea, constipation, blood in stool and melena.  Genitourinary: Negative for dysuria, urgency, frequency, hematuria and flank pain.  Musculoskeletal: Negative for myalgias, back pain, joint pain and falls.  Skin: Negative for itching and rash.  Neurological: confusion, lethargy, dizziness, tingling, tremors, sensory change, speech change, focal weakness, seizures, loss of consciousness, weakness and headaches.  Endo/Heme/Allergies: Negative for environmental allergies and polydipsia. Does not bruise/bleed easily.  SUBJECTIVE:  Pt lethargic on RA no complaints at  this time.  VITAL SIGNS: Temp:  [98.2 F (36.8 C)] 98.2 F (36.8 C) (04/20 0605) Pulse Rate:  [85-95] 91 (04/20 0745) Resp:  [10-16] 10 (04/20 0745) BP: (89-156)/(49-119) 104/60 (04/20 0745) SpO2:  [96 %-100 %] 98 % (04/20 0745) Weight:  [105.7 kg (233 lb)-115.1 kg (253 lb 11.2 oz)] 115.1 kg (253 lb 11.2 oz) (04/20 0615)  PHYSICAL EXAMINATION: General: chronically ill appearing Caucasian female to female transgender pt, NAD Neuro: lethargic, follows commands, PERRL HEENT: supple, no JVD Cardiovascular: nsr, s1s2, rrr, no M/R/G Lungs: rhonchi throughout, even, non labored  Abdomen: ascites, distended, +BS x4, firm, non tender Musculoskeletal:  Right BKA, 1+ left lower extremity edema Skin: ulceration left flank, venous stasis ulcers left lower extremity    Recent Labs Lab 06/22/2016 0606  NA 132*  K 3.6  CL 104  CO2 22  BUN 22*  CREATININE 1.95*  GLUCOSE 79    Recent Labs Lab 05/28/2016 0606  HGB 10.2*  HCT 31.4*  WBC 17.1*  PLT 317   Dg Chest 2 View  Result Date: 05/28/2016 CLINICAL DATA:  Altered mental status. Repetitive speech. Hypotension. Coming in and out of consciousness. History of diabetes, hypertension, and sleep apnea. EXAM: CHEST  2 VIEW COMPARISON:  05/31/2016 FINDINGS: Shallow inspiration with linear atelectasis or infiltration in the lung bases similar prior study. Small bilateral pleural effusions. Mild cardiac enlargement. Pulmonary vascularity is normal for technique. Old bilateral rib fractures. IMPRESSION: Shallow inspiration with atelectasis or infiltration in the lung bases and small pleural effusions bilaterally. Electronically Signed   By: Burman Nieves M.D.   On: 06/17/2016 06:53    ASSESSMENT / PLAN: Acute hepatic encephalopathy secondary to markedly elevated ammonia level  ?HCAP Hypotension Acute renal failure likely secondary to hypotension Severe protein-calorie malnutrition Hyponatremia Leukocytosis Ascites  P: Supplemental O2 to  maintain O2 sats >92% Continue broad spectrum abx for now Trend PCT's Trend WBC and monitor fever curve Follow cultures Fluid resuscitation if bp does not improve with adequate fluid resuscitation will place order for prn levophed to maintain map goal >65 STAT CT Head pending US Paracentesis pending  Scheduled albumin and lactulose   Frequent reorientation Trend CMP  Replace electrolytes as indicated Monitor UOP Urology to placed foley catheter  Avoid nephrotoxic medications  Echo pending  Trend CBC Keep NPO Dietitian consulted  Speech evaluation pending  Lovenox for VTE prophylaxis Monitor for s/sx of bleeding   -No family at bedside to update 06/13/2017  Sonda Rumble, Juel Burrow  Pulmonary/Critical Care Pager [phone] (please enter 7 digits) PCCM Consult Pager [phone] (please enter 7 digits)   PCCM ATTENDING ATTESTATION: I have evaluated patient with the APP [place], reviewed database in its entirety and discussed care plan in detail. In addition, this patient was discussed on multidisciplinary rounds.   A resident of a skilled nursing facility with altered mental status and hypotension. He has a multitude of chronic medical problems as documented above.  On my initial evaluation he was minimally responsive but in no distress. He has profound anasarca with peau d'orange's on his abdomen and lower extremities. He has a large ulcerated lesion on his left side at the level of the lower rib cage where he fell and injured himself a couple of weeks ago. This is a deep ulcer with some granulation tissue and somewhat malodorous but not clearly infected. He is status post R BKA. His admitting diagnosis was presumption of sepsis without a clear source. A care provider suggests the possibility of opioid overdose. The care plan above has been reviewed in detail and modified by me    Billy Fischer, MD PCCM service Mobile [phone] Pager [phone] 2016/06/18 5:28 PM

## 2016-06-13 NOTE — ED Provider Notes (Signed)
[facility] Emergency Department Provider Note   First MD Initiated Contact with Patient 2016-06-20 [phone]     (approximate)  I have reviewed the triage vital signs and the nursing notes.  History Limited secondary to altered mental status HISTORY  Chief Complaint Altered Mental Status    HPI [name] is a 56 y.o. female with below list of chronic medical conditions presents via EMS from [facility] nursing facility with complaint of altered mental status. Per EMS nursing staff stated that the patient is markedly confusing comparison to baseline. EMS states that the nursing staff states that the patient unable to maintain consciousness. Patient denies any complaints however is somnolent during this interview. Patient denies any pain no fever or any complaints at this time.   Past Medical History:  Diagnosis Date  . Anemia    iron deficient  . Anxiety   . Chicken pox   . Chronic pain   . Depression   . DM type 2 (diabetes mellitus, type 2) (HCC) 2006   Essentially resolved following bariatric surgery, no meds  . History of malnutrition   . History of sleep apnea   . History of vitamin A deficiency   . HTN (hypertension)    History of; resolved following bariatric surgery  . Sleep apnea    no CPAP after losing weight  . Venous stasis     Patient Active Problem List   Diagnosis Date Noted  . Anasarca 2016/06/20  . Acute hepatic encephalopathy 2016-06-20  . Acute renal failure (ARF) (HCC) 06-20-2016  . Severe protein-calorie malnutrition Lily Kocher: less than 60% of standard weight) (HCC) June 20, 2016  . Hyponatremia 06-20-16  . Leukocytosis 20-Jun-2016  . Ascites 06/20/16  . Hepatic encephalopathy (HCC) 06/20/16  . Healthcare-associated pneumonia   . Palliative care by specialist   . Goals of care, counseling/discussion   . Protein-calorie malnutrition, severe 05/29/2016  . Pressure injury of skin 05/28/2016  . Rib fractures 05/27/2016  .  Chronic edema   . Finger fracture, right   . Benign essential HTN   . Chronic pain syndrome   . Venous stasis ulcer (HCC)   . Cellulitis 04/28/2015  . Anemia 04/28/2015  . Weakness generalized 04/28/2015  . Hypokalemia 04/28/2015  . Pressure ulcer of buttock, unstageable (HCC) 04/28/2015  . Scrotal swelling 04/28/2015  . Fall 04/28/2015  . Protein-calorie malnutrition (HCC) 04/28/2015  . Weakness 04/28/2015  . Cellulitis, scrotum   . Failure to thrive in adult   . Fungal dermatitis   . Generalized weakness   . Muscular deconditioning   . Scrotal edema   . Swelling of scrotum   . Hypothyroidism 01/17/2015  . Anemia, iron deficiency 01/17/2015  . Vitamin D deficiency 01/17/2015  . S/P bariatric surgery 01/17/2015  . DM type 2 (diabetes mellitus, type 2) (HCC) 01/17/2015  . Venous stasis ulcer of left lower extremity (HCC) 12/14/2013  . Chronic pain 07/10/2013  . 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 APPENDECTOMY  1991  . BELOW KNEE LEG AMPUTATION Right 2014  . CHOLECYSTECTOMY  1991  . Gastric sleeve, biliopancreatic diversion, duodenal switch  2006  . HERNIA REPAIR  2009  . severe malnutrition  2014  . TONSILLECTOMY AND ADENOIDECTOMY  1967    Prior to Admission medications   Medication Sig Start Date End Date Taking? Authorizing Provider  acetaminophen (TYLENOL) 325 MG tablet Take 2 tablets (650 mg total)  by mouth every 6 (six) hours as needed for mild pain (or Fever >/= 101). 05/01/15  Yes Christiane Ha, MD  amLODipine (NORVASC) 2.5 MG tablet Take 2.5 mg by mouth daily.   Yes Historical Provider, MD  amoxicillin-clavulanate (AUGMENTIN) 875-125 MG tablet Take 1 tablet by mouth every 12 (twelve) hours. For cellulitis.   Yes Historical Provider, MD  aspirin EC 81 MG tablet Take 81 mg by mouth daily.   Yes Historical Provider, MD  busPIRone (BUSPAR) 15 MG tablet Take 15 mg by mouth 2 (two)  times daily.   Yes Historical Provider, MD  calcium carbonate (OSCAL) 1500 (600 Ca) MG TABS tablet Take 600 mg of elemental calcium by mouth 2 (two) times daily with a meal.   Yes Historical Provider, MD  citalopram (CELEXA) 10 MG tablet Take 10 mg by mouth daily. For depression 10/30/11  Yes Historical Provider, MD  collagenase (SANTYL) ointment Apply topically daily. 06/01/16  Yes Auburn Bilberry, MD  donepezil (ARICEPT) 10 MG tablet Take 10 mg by mouth at bedtime.   Yes Historical Provider, MD  DULoxetine (CYMBALTA) 20 MG capsule Take 20 mg by mouth daily.  12/29/14  Yes Historical Provider, MD  ergocalciferol (VITAMIN D2) 50000 units capsule Take 50,000 Units by mouth once a week.   Yes Historical Provider, MD  ferrous sulfate 325 (65 FE) MG tablet Take 325 mg by mouth 2 (two) times daily.    Yes Historical Provider, MD  folic acid (FOLVITE) 1 MG tablet Take 1 mg by mouth daily. 05/08/16  Yes Historical Provider, MD  levothyroxine (SYNTHROID, LEVOTHROID) 200 MCG tablet TAKE ONE TABLET BY MOUTH DAILY 04/05/15  Yes Tommie Sams, DO  mirtazapine (REMERON) 7.5 MG tablet Take 7.5 mg by mouth at bedtime.  12/29/14  Yes Historical Provider, MD  morphine (MS CONTIN) 15 MG 12 hr tablet Take 1 tablet (15 mg total) by mouth every 12 (twelve) hours. For pain 06/01/16  Yes Auburn Bilberry, MD  Multiple Vitamins-Minerals (DECUBI-VITE PO) Take 1 tablet by mouth daily.   Yes Historical Provider, MD  omeprazole (PRILOSEC) 20 MG capsule Take 20 mg by mouth daily.   Yes Historical Provider, MD  oseltamivir (TAMIFLU) 75 MG capsule Take 75 mg by mouth daily. 06/03/16 06/17/16 Yes Historical Provider, MD  pantoprazole (PROTONIX) 40 MG tablet Take 40 mg by mouth daily. For GERD 12/29/14  Yes Historical Provider, MD  polyethylene glycol (MIRALAX / GLYCOLAX) packet Take 17 g by mouth daily as needed for mild constipation. 06/01/16  Yes Auburn Bilberry, MD  potassium chloride SA (K-DUR,KLOR-CON) 20 MEQ tablet Take 20 mEq by mouth daily.    Yes Historical Provider, MD  simethicone (MYLICON) 80 MG chewable tablet Chew 1 tablet (80 mg total) by mouth every 6 (six) hours as needed for flatulence. 06/01/16  Yes Auburn Bilberry, MD  TORSEMIDE PO Take 30 mg by mouth 2 (two) times daily. Hold for sbp < 110   Yes Historical Provider, MD  vitamin C (ASCORBIC ACID) 500 MG tablet Take 500 mg by mouth daily. FOR 30 DAYS   STOP DATE 06/04/15   Yes Historical Provider, MD  Amino Acids-Protein Hydrolys (FEEDING SUPPLEMENT, PRO-STAT SUGAR FREE 64,) LIQD Take 30 mLs by mouth 2 (two) times daily with a meal. Stop date 06/04/15    Historical Provider, MD  ipratropium-albuterol (DUONEB) 0.5-2.5 (3) MG/3ML SOLN Take 3 mLs by nebulization every 6 (six) hours as needed. Patient not taking: Reported on 2016/06/27 06/02/16   Auburn Bilberry, MD  oxyCODONE-acetaminophen (  PERCOCET/ROXICET) 5-325 MG tablet Take 1 tablet by mouth every 4 (four) hours as needed for moderate pain or severe pain (Take 2 tablets every 4 hours as needed for severe pain). Patient not taking: Reported on 06-24-2016 06/01/16   Auburn Bilberry, MD  Pramoxine-Calamine (AVEENO ANTI-ITCH) 1-3 % CREA Apply 1 Bottle topically every 8 (eight) hours. Patient not taking: Reported on 05/27/2016 05/14/15   Donalee Citrin Ngetich, NP  protein supplement shake (PREMIER PROTEIN) LIQD Take 325 mLs (11 oz total) by mouth 4 (four) times daily. 06/01/16   Auburn Bilberry, MD    Allergies No known drug allergies  Family History  Problem Relation Age of Onset  . Arthritis Mother   . Diabetes Mother     Social History Social History  Substance Use Topics  . Smoking status: Never Smoker  . Smokeless tobacco: Never Used  . Alcohol use No    Review of Systems Constitutional: No fever/chills Eyes: No visual changes. ENT: No sore throat. Cardiovascular: Denies chest pain. Respiratory: Denies shortness of breath. Gastrointestinal: No abdominal pain.  No nausea, no vomiting.  No diarrhea.  No constipation. Genitourinary:  Negative for dysuria. Musculoskeletal: Negative for back pain. Skin: Negative for rash. Neurological: Positive for altered mental status 10-point ROS otherwise negative.  ____________________________________________   PHYSICAL EXAM:  VITAL SIGNS: ED Triage Vitals  Enc Vitals Group     BP 24-Jun-2016 0605 (!) 152/82     Pulse Rate 06-24-16 0605 95     Resp 2016-06-24 0605 14     Temp 06/24/16 0605 98.2 F (36.8 C)     Temp Source 06-24-2016 0605 Oral     SpO2 Jun 24, 2016 0604 98 %     Weight 06-24-2016 0606 233 lb (105.7 kg)     Height --      Head Circumference --      Peak Flow --      Pain Score --      Pain Loc --      Pain Edu? --      Excl. in GC? --     Constitutional: Alert But very somnolent fall asleep repetitively during interview. Eyes: Conjunctivae are pale. PERRL. EOMI. Head: Atraumatic. Mouth/Throat: Mucous membranes are moist. Oropharynx non-erythematous. Neck: No stridor.   Cardiovascular: Normal rate, regular rhythm. Good peripheral circulation. Grossly normal heart sounds. Respiratory: Tachypnea  No retractions. Diffuse rhonchi bilaterally Gastrointestinal: Soft and nontender. No distention. Guaiac positive Musculoskeletal: Bilateral upper extremity edema. No gross deformities of extremities. Neurologic:  Normal speech and language. No gross focal neurologic deficits are appreciated.  Skin: ulcers noted patient's back Psychiatric: Mood and affect are normal. Speech and behavior are normal.  ____________________________________________   LABS (all labs ordered are listed, but only abnormal results are displayed)  Labs Reviewed  COMPREHENSIVE METABOLIC PANEL - Abnormal; Notable for the following:       Result Value   Sodium 132 (*)    BUN 22 (*)    Creatinine, Ser 1.95 (*)    Calcium 7.5 (*)    Total Protein 6.4 (*)    Albumin 1.8 (*)    Alkaline Phosphatase 162 (*)    GFR calc non Af Amer 37 (*)    GFR calc Af Amer 43 (*)    All other components  within normal limits  CBC WITH DIFFERENTIAL/PLATELET - Abnormal; Notable for the following:    WBC 17.1 (*)    RBC 3.26 (*)    Hemoglobin 10.2 (*)    HCT 31.4 (*)  RDW 14.8 (*)    Neutro Abs 13.6 (*)    All other components within normal limits  URINALYSIS, COMPLETE (UACMP) WITH MICROSCOPIC - Abnormal; Notable for the following:    Color, Urine AMBER (*)    APPearance HAZY (*)    Bacteria, UA RARE (*)    Squamous Epithelial / LPF 0-5 (*)    All other components within normal limits  AMMONIA - Abnormal; Notable for the following:    Ammonia 183 (*)    All other components within normal limits  MAGNESIUM - Abnormal; Notable for the following:    Magnesium 1.6 (*)    All other components within normal limits  PROTIME-INR - Abnormal; Notable for the following:    Prothrombin Time 21.4 (*)    All other components within normal limits  TROPONIN I - Abnormal; Notable for the following:    Troponin I 0.03 (*)    All other components within normal limits  TSH - Abnormal; Notable for the following:    TSH 6.724 (*)    All other components within normal limits  COMPREHENSIVE METABOLIC PANEL - Abnormal; Notable for the following:    Sodium 134 (*)    Potassium 3.1 (*)    CO2 20 (*)    Glucose, Bld 139 (*)    BUN 21 (*)    Creatinine, Ser 1.59 (*)    Calcium 7.8 (*)    Total Protein 6.0 (*)    Albumin 2.8 (*)    Total Bilirubin 1.4 (*)    GFR calc non Af Amer 48 (*)    GFR calc Af Amer 55 (*)    All other components within normal limits  CBC - Abnormal; Notable for the following:    RBC 2.55 (*)    Hemoglobin 8.3 (*)    HCT 24.7 (*)    All other components within normal limits  GLUCOSE, CAPILLARY - Abnormal; Notable for the following:    Glucose-Capillary 160 (*)    All other components within normal limits  CULTURE, BLOOD (ROUTINE X 2)  CULTURE, BLOOD (ROUTINE X 2)  MRSA PCR SCREENING  C DIFFICILE QUICK SCREEN W PCR REFLEX  CULTURE, EXPECTORATED SPUTUM-ASSESSMENT    LACTIC ACID, PLASMA  INFLUENZA PANEL BY PCR (TYPE A & B)  PROCALCITONIN  GLUCOSE, CAPILLARY  PROCALCITONIN  TROPONIN I  GLUCOSE, CAPILLARY  HEMOGLOBIN A1C  VITAMIN B1  TYPE AND SCREEN   ____________________________________________  EKG  ED ECG REPORT I, Ronda N Rosio Weiss, the attending physician, personally viewed and interpreted this ECG.   Date: 06/14/2016  EKG Time: 6:07 AM  Rate: 92  Rhythm: Normal sinus rhythm  Axis: Right axis deviation  Intervals: Prolonged QT QTC 561  ST&T Change: None  ____________________________________________  RADIOLOGY I, [place] Warnell Rasnic, personally viewed and evaluated these images (plain radiographs) as part of my medical decision making, as well as reviewing the written report by the radiologist.  Dg Abd 1 View  Result Date: 05/29/2016 CLINICAL DATA:  NG tube placement EXAM: ABDOMEN - 1 VIEW COMPARISON:  CT abdomen and pelvis 04/28/2015 FINDINGS: Enteric tube tip is in the right upper mid abdomen consistent with location in the lower stomach. Visualized small and large bowel are not abnormally distended. IMPRESSION: Enteric tube tip in the right upper mid abdomen consistent with location in the lower stomach. Electronically Signed   By: Burman Nieves M.D.   On: 06/19/2016 22:30   Ct Head Wo Contrast  Result Date: 05/26/2016 CLINICAL DATA:  55 year old  female with history of altered mental status, repetitive speech and hypotension. Waxing and waning consciousness. EXAM: CT HEAD WITHOUT CONTRAST TECHNIQUE: Contiguous axial images were obtained from the base of the skull through the vertex without intravenous contrast. COMPARISON:  None. FINDINGS: Brain: No evidence of acute infarction, hemorrhage, hydrocephalus, extra-axial collection or mass lesion/mass effect. Vascular: No hyperdense vessel or unexpected calcification. Skull: Normal. Negative for fracture or focal lesion. Sinuses/Orbits: No acute finding. Other: None. IMPRESSION: 1. No acute  intracranial abnormalities. The appearance of the brain is normal. Electronically Signed   By: Trudie Reed M.D.   On: 07-Jul-2016 12:42   US Abdomen Limited  Result Date: 2016-07-07 CLINICAL DATA:  Evaluate for ascites. EXAM: LIMITED ABDOMEN ULTRASOUND FOR ASCITES TECHNIQUE: Limited ultrasound survey for ascites was performed in all four abdominal quadrants. COMPARISON:  None. FINDINGS: No ascites is demonstrated within the abdomen. IMPRESSION: No evidence of ascites. Electronically Signed   By: Simonne Come M.D.   On: Jul 07, 2016 10:55   Dg Chest Port 1 View  Result Date: 06/14/2016 CLINICAL DATA:  Respiratory failure EXAM: PORTABLE CHEST 1 VIEW COMPARISON:  07-07-2016 FINDINGS: Esophageal tube tip is below the diaphragm but is not included. Elevation of the right diaphragm. Very low lung volumes. Increased infiltrate in the right mid to upper lung. Slight increased left basilar infiltrate. Small left effusion. Stable enlarged cardiomediastinal silhouette. Right upper extremity catheter tip projects over the low right atrium. No pneumothorax. IMPRESSION: 1. Right upper extremity catheter tip overlies the low right atrium 2. Low lung volumes. 3. Cardiomegaly with small bilateral effusions 4. Increased infiltrate in the right mid lung and increased atelectasis or infiltrate at the left lung base. Electronically Signed   By: Jasmine Pang M.D.   On: 06/14/2016 03:44    ____________________________________________   PROCEDURES  Critical Care performed:CRITICAL CARE Performed by: Darci Current   Total critical care time: 45 minutes  Critical care time was exclusive of separately billable procedures and treating other patients.  Critical care was necessary to treat or prevent imminent or life-threatening deterioration.  Critical care was time spent personally by me on the following activities: development of treatment plan with patient and/or surrogate as well as nursing, discussions with  consultants, evaluation of patient's response to treatment, examination of patient, obtaining history from patient or surrogate, ordering and performing treatments and interventions, ordering and review of laboratory studies, ordering and review of radiographic studies, pulse oximetry and re-evaluation of patient's condition.     Procedures   ____________________________________________   INITIAL IMPRESSION / ASSESSMENT AND PLAN / ED COURSE  Pertinent labs & imaging results that were available during my care of the patient were reviewed by me and considered in my medical decision making (see chart for details).  56 year old female presenting with altered mental status. Given clinical findings concern for possible sepsis most likely secondary to pneumonia. Patient recently had multiple rib fractures with apparent difficulty breathing at this time rhonchi noted bibasilar. As such patient was made code sepsis and IV vancomycin and cefepime given. Regarding patient's somnolence concern for possible hyperammonemia ammonia pending at this time. Patient's care transferred to Dr. Mayford Knife.    Clinical Course as of Jun 14 721  Fri 07-07-2016  0711 Ammonia: (!) 183 [JW]  0711 WBC: (!) 17.1 [JW]  1610 Patient will be given a dose of lactulose. He was also heme positive rectally  [JW]    Clinical Course User Index [JW] Emily Filbert, MD    ____________________________________________  FINAL CLINICAL IMPRESSION(S) / ED DIAGNOSES  Final diagnoses:  Dehydration  Hyperammonemia (HCC)  Healthcare-associated pneumonia     MEDICATIONS GIVEN DURING THIS VISIT:  Medications  collagenase (SANTYL) ointment (1 application Topical Given 07-02-2016 1234)  folic acid (FOLVITE) tablet 1 mg (1 mg Oral Not Given Jul 02, 2016 1200)  enoxaparin (LOVENOX) injection 30 mg (30 mg Subcutaneous Given 07-02-2016 2112)  sodium chloride flush (NS) 0.9 % injection 3 mL (3 mLs Intravenous Given Jul 02, 2016 2120)    sodium chloride flush (NS) 0.9 % injection 3 mL (3 mLs Intravenous Given 2016-07-02 2120)  sodium chloride flush (NS) 0.9 % injection 3 mL (not administered)  0.9 %  sodium chloride infusion (not administered)  ondansetron (ZOFRAN) tablet 4 mg (not administered)    Or  ondansetron (ZOFRAN) injection 4 mg (not administered)  albumin human 25 % solution 25 g (25 g Intravenous New Bag/Given 06/14/16 0506)  ceFEPIme (MAXIPIME) 2 g in dextrose 5 % 50 mL IVPB (0 g Intravenous Stopped 07/02/16 2140)  vancomycin (VANCOCIN) 1,250 mg in sodium chloride 0.9 % 250 mL IVPB (0 mg Intravenous Stopped 2016/07/02 1845)  feeding supplement (PRO-STAT SUGAR FREE 64) liquid 30 mL (30 mLs Oral Not Given Jul 02, 2016 1715)  calamine lotion 1 application (1 application Topical Given 06/14/16 0505)  Vitamin D (Ergocalciferol) (DRISDOL) capsule 50,000 Units (not administered)  levothyroxine (SYNTHROID, LEVOTHROID) injection 100 mcg (100 mcg Intravenous Given Jul 02, 2016 1715)  pantoprazole (PROTONIX) injection 40 mg (40 mg Intravenous Given 2016/07/02 1358)  multivitamin liquid 15 mL (15 mLs Oral Not Given 07/02/16 1716)  norepinephrine (LEVOPHED)  in D5W premix infusion (22 mcg/min Intravenous Rate/Dose Change 06/14/16 0633)  sodium chloride flush (NS) 0.9 % injection 10-40 mL (10 mLs Intracatheter Given Jul 02, 2016 2120)  sodium chloride flush (NS) 0.9 % injection 10-40 mL (not administered)  lactulose (CHRONULAC) 10 GM/15ML solution 30 g (30 g Per Tube Given 2016/07/02 2322)  protein supplement (PREMIER PROTEIN) liquid (11 oz Per NG tube Given 02-Jul-2016 2330)  rifaximin (XIFAXAN) tablet 550 mg (550 mg Per Tube Given 07/02/16 2329)  guaiFENesin-dextromethorphan (ROBITUSSIN DM) 100-10 MG/5ML syrup 5 mL (5 mLs Oral Given 06/14/16 0456)  potassium chloride 30 mEq in sodium chloride 0.9 % 265 mL (KCL MULTIRUN) IVPB (not administered)  ceFEPIme (MAXIPIME) 2 g in dextrose 5 % 50 mL IVPB (0 g Intravenous Stopped 07-02-2016 0858)  vancomycin  (VANCOCIN) IVPB 1000 mg/200 mL premix (0 mg Intravenous Stopped 07-02-2016 0817)  sodium chloride 0.9 % bolus 1,000 mL (0 mLs Intravenous Stopped Jul 02, 2016 1025)  sodium chloride 0.9 % bolus 1,000 mL (0 mLs Intravenous Stopped Jul 02, 2016 0818)  lactulose (CHRONULAC) 10 GM/15ML solution 30 g (30 g Oral Given 02-Jul-2016 0830)  sodium chloride 0.9 % bolus 500 mL (0 mLs Intravenous Stopped 07/02/2016 1315)  magnesium sulfate IVPB 2 g 50 mL (0 g Intravenous Stopped 2016/07/02 1843)  sterile water (preservative free) injection (10 mLs  Given 2016/07/02 1715)     NEW OUTPATIENT MEDICATIONS STARTED DURING THIS VISIT:  Current Discharge Medication List      Current Discharge Medication List      Current Discharge Medication List       Note:  This document was prepared using Dragon voice recognition software and may include unintentional dictation errors.    Darci Current, MD 06/14/16 [phone]

## 2016-06-14 ENCOUNTER — Inpatient Hospital Stay: Payer: 59

## 2016-06-14 DIAGNOSIS — I959 Hypotension, unspecified: Secondary | ICD-10-CM

## 2016-06-14 LAB — ECHOCARDIOGRAM COMPLETE
Height: 67 in
Weight: 3961.23 oz

## 2016-06-14 LAB — GLUCOSE, CAPILLARY
GLUCOSE-CAPILLARY: 128 mg/dL — AB (ref 65–99)
GLUCOSE-CAPILLARY: 151 mg/dL — AB (ref 65–99)
GLUCOSE-CAPILLARY: 155 mg/dL — AB (ref 65–99)
Glucose-Capillary: 133 mg/dL — ABNORMAL HIGH (ref 65–99)

## 2016-06-14 LAB — PROCALCITONIN: Procalcitonin: 0.7 ng/mL

## 2016-06-14 LAB — MAGNESIUM: Magnesium: 1.9 mg/dL (ref 1.7–2.4)

## 2016-06-14 LAB — TROPONIN I: Troponin I: <0.03 ng/mL (ref <0.03)

## 2016-06-14 MED ORDER — PRO-STAT SUGAR FREE PO LIQD
30.0000 mL | Freq: Two times a day (BID) | ORAL | Status: DC
  Administered 2016-06-14: 30 mL

## 2016-06-14 MED ORDER — ADULT MULTIVITAMIN LIQUID CH
15.0000 mL | Freq: Every day | ORAL | Status: DC
  Administered 2016-06-14 – 2016-06-19 (×6): 15 mL
  Filled 2016-06-14 (×7): qty 15

## 2016-06-14 MED ORDER — VITAL HIGH PROTEIN PO LIQD
1000.0000 mL | ORAL | Status: DC
  Administered 2016-06-14: 1000 mL
  Administered 2016-06-14: 14:00:00

## 2016-06-14 MED ORDER — PRO-STAT SUGAR FREE PO LIQD
30.0000 mL | Freq: Two times a day (BID) | ORAL | Status: DC
  Administered 2016-06-14 – 2016-06-15 (×3): 30 mL

## 2016-06-14 MED ORDER — GUAIFENESIN-DM 100-10 MG/5ML PO SYRP
5.0000 mL | ORAL_SOLUTION | ORAL | Status: DC | PRN
  Administered 2016-06-14: 5 mL via ORAL
  Filled 2016-06-14: qty 5

## 2016-06-14 MED ORDER — IPRATROPIUM-ALBUTEROL 0.5-2.5 (3) MG/3ML IN SOLN
3.0000 mL | RESPIRATORY_TRACT | Status: DC | PRN
  Administered 2016-06-14 – 2016-06-17 (×3): 3 mL via RESPIRATORY_TRACT
  Filled 2016-06-14 (×3): qty 3

## 2016-06-14 MED ORDER — POTASSIUM CHLORIDE 20 MEQ/15ML (10%) PO SOLN
40.0000 meq | Freq: Two times a day (BID) | ORAL | Status: AC
  Administered 2016-06-14 (×2): 40 meq
  Filled 2016-06-14 (×2): qty 30

## 2016-06-14 MED ORDER — DEXTROSE 5 % IV SOLN
0.0000 ug/min | INTRAVENOUS | Status: DC
  Administered 2016-06-14: 20 ug/min via INTRAVENOUS
  Administered 2016-06-15 – 2016-06-17 (×2): 6 ug/min via INTRAVENOUS
  Filled 2016-06-14 (×4): qty 16

## 2016-06-14 MED ORDER — SODIUM CHLORIDE 0.9 % IV SOLN
30.0000 meq | Freq: Once | INTRAVENOUS | Status: AC
  Administered 2016-06-14: 30 meq via INTRAVENOUS
  Filled 2016-06-14: qty 15

## 2016-06-14 MED ORDER — VITAMIN D (ERGOCALCIFEROL) 1.25 MG (50000 UNIT) PO CAPS
50000.0000 [IU] | ORAL_CAPSULE | ORAL | Status: DC
  Filled 2016-06-14: qty 1

## 2016-06-14 MED ORDER — FOLIC ACID 1 MG PO TABS
1.0000 mg | ORAL_TABLET | Freq: Every day | ORAL | Status: DC
  Administered 2016-06-14 – 2016-06-19 (×6): 1 mg
  Filled 2016-06-14 (×6): qty 1

## 2016-06-14 NOTE — Progress Notes (Signed)
More alert. Oriented. No distress. No new complaints  Vitals:   06/14/16 0900 06/14/16 1000 06/14/16 1100 06/14/16 1200  BP: (!) 105/57 (!) 103/57 115/61 (!) 96/54  Pulse: 77 72 85 82  Resp: (!) 9  Temp:    98.9 F (37.2 C)  TempSrc:    Oral  SpO2: 98% 97% 98% 91%  Weight:      Height:       Frail appearing, NAD JVP cannot be visualized Coarse BS, no wheezes Reg, no M Abdomen soft, NT Severe anasarca R BKA   CMP Latest Ref Rng & Units 06/14/2016 2016/06/15 06/02/2016  Glucose 65 - 99 mg/dL 161(W) 79 960(A)  BUN 6 - 20 mg/dL 54(U) 98(J) 10  Creatinine 0.61 - 1.24 mg/dL 1.91(Y) 7.82(N) 5.62(Z)  Sodium 135 - 145 mmol/L 134(L) 132(L) 136  Potassium 3.5 - 5.1 mmol/L 3.1(L) 3.6 3.8  Chloride 101 - 111 mmol/L 108 104 111  CO2 22 - 32 mmol/L 20(L) 22 23  Calcium 8.9 - 10.3 mg/dL 7.8(L) 7.5(L) 7.2(L)  Total Protein 6.5 - 8.1 g/dL 6.0(L) 6.4(L) -  Total Bilirubin 0.3 - 1.2 mg/dL 3.0(Q) 1.2 -  Alkaline Phos 38 - 126 U/L 125 162(H) -  AST 15 - 41 U/L 28 35 -  ALT 17 - 63 U/L 22 29 -   CBC Latest Ref Rng & Units 06/14/2016 15-Jun-2016 05/31/2016  WBC 3.8 - 10.6 K/uL 10.6 17.1(H) 9.1  Hemoglobin 13.0 - 18.0 g/dL 8.3(L) 10.2(L) 8.9(L)  Hematocrit 40.0 - 52.0 % 24.7(L) 31.4(L) 26.8(L)  Platelets 150 - 440 K/uL 231 317 203   CXR: very low volumes with vascular crowding and patchy infiltrates vs atx  IMPRESSION: Chronic severe debilitation Chronic severe protein-calorie malnutrition with severe hypoalbuminemia  Chronic anasarca due to hypoalbuminemia Hypotension on vasopressors Acute encephalopathy with elevated ammonia - no prior history of liver disease  Chronic opioid use (possible abuse)  Much improved AKI, nonoliguric - Cr improving Possible severe sepsis - minimally elevated PCT  Possible PNA  L flank skin ulcer - does not appear overtly infected  PLAN/REC: Cont current abx Wean NE to off as able for MAP > 60 mmHg Continue nutritional support Monitor BMET  intermittently Monitor I/Os Correct electrolytes as indicated Cont current abx Follow micro results Monitor temp, WBC count Began discussion re: goals of care. He is a poor candidate for intubation/mech vent and for ACLS given severe chronic debilitation  Billy Fischer, MD PCCM service Mobile [phone] Pager [phone] 06/14/2016

## 2016-06-14 NOTE — Progress Notes (Signed)
[facility] - [place] at [facility]   PATIENT NAME: [name]    MR#:  [id number]  DATE OF BIRTH:  04-29-1960  SUBJECTIVE:  CHIEF COMPLAINT: Patient is more alert but still pleasantly confused intermittently. Had 2 bowel movements and denies any abdominal pain   REVIEW OF SYSTEMS:  CONSTITUTIONAL: No fever, fatigue or weakness.  EYES: No blurred or double vision.  EARS, NOSE, AND THROAT: No tinnitus or ear pain.  RESPIRATORY: No cough, shortness of breath, wheezing or hemoptysis.  CARDIOVASCULAR: No chest pain, orthopnea, edema.  GASTROINTESTINAL: No nausea, vomiting, diarrhea or abdominal pain.  GENITOURINARY: No dysuria, hematuria.  ENDOCRINE: No polyuria, nocturia,  HEMATOLOGY: No anemia, easy bruising or bleeding SKIN: No rash or lesion. MUSCULOSKELETAL: No joint pain or arthritis.   NEUROLOGIC: Intermittent episodes of confusion. No tingling, numbness PSYCHIATRY: No anxiety or depression.   DRUG ALLERGIES:  No Known Allergies  VITALS:  Blood pressure (!) 100/51, pulse 81, temperature 98.9 F (37.2 C), temperature source Oral, resp. rate 12, height  (1.702 m), weight 113 kg (249 lb 1.9 oz), SpO2 95 %.  PHYSICAL EXAMINATION:  GENERAL:  56 y.o.-year-old Caucasian  transgender patient  lying in the bed with no acute distress.  EYES: Pupils equal, round, reactive to light and accommodation. No scleral icterus. Extraocular muscles intact.  HEENT: Head atraumatic, normocephalic. Oropharynx and nasopharynx clear.  NECK:  Supple, no jugular venous distention. No thyroid enlargement, no tenderness.  LUNGS: Normal breath sounds bilaterally, no wheezing, rales,rhonchi or crepitation. No use of accessory muscles of respiration.  CARDIOVASCULAR: S1, S2 normal. No murmurs, rubs, or gallops.  ABDOMEN: Soft, nontender, distended. Ascites Bowel sounds present. No organomegaly or mass.  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  NEUROLOGIC: Alert but intermittent episodes of lethargy. Sensation intact. Gait not checked.  PSYCHIATRIC: The patient is alert and intermittent episodes of lethargy but following commands.  SKIN: No obvious rash, lesion, or ulcer.    LABORATORY PANEL:   CBC  Recent Labs Lab 06/14/16 0420  WBC 10.6  HGB 8.3*  HCT 24.7*  PLT 231   ------------------------------------------------------------------------------------------------------------------  Chemistries   Recent Labs Lab 06/03/2016 0606 06/14/16 0420  NA 132* 134*  K 3.6 3.1*  CL 104 108  CO2 22 20*  GLUCOSE 79 139*  BUN 22* 21*  CREATININE 1.95* 1.59*  CALCIUM 7.5* 7.8*  MG 1.6*  --   AST 35 28  ALT 29 22  ALKPHOS 162* 125  BILITOT 1.2 1.4*   ------------------------------------------------------------------------------------------------------------------  Cardiac Enzymes  Recent Labs Lab 06/14/16 0420  TROPONINI <0.03   ------------------------------------------------------------------------------------------------------------------  RADIOLOGY:  Dg Chest 2 View  Result Date: 06/14/2016 CLINICAL DATA:  Altered mental status. Repetitive speech. Hypotension. Coming in and out of consciousness. History of diabetes, hypertension, and sleep apnea. EXAM: CHEST  2 VIEW COMPARISON:  05/31/2016 FINDINGS: Shallow inspiration with linear atelectasis or infiltration in the lung bases similar prior study. Small bilateral pleural effusions. Mild cardiac enlargement. Pulmonary vascularity is normal for technique. Old bilateral rib fractures. IMPRESSION: Shallow inspiration with atelectasis or infiltration in the lung bases and small pleural effusions bilaterally. Electronically Signed   By: Burman Nieves M.D.   On: 05/30/2016 06:53   Dg Abd 1 View  Result Date: 06/10/2016 CLINICAL DATA:  NG tube placement EXAM: ABDOMEN - 1 VIEW COMPARISON:  CT abdomen and pelvis 04/28/2015 FINDINGS: Enteric tube  tip is in the right upper mid abdomen  consistent with location in the lower stomach. Visualized small and large bowel are not abnormally distended. IMPRESSION: Enteric tube tip in the right upper mid abdomen consistent with location in the lower stomach. Electronically Signed   By: Burman Nieves M.D.   On: 07-12-2016 22:30   Ct Head Wo Contrast  Result Date: Jul 12, 2016 CLINICAL DATA:  56 year old female with history of altered mental status, repetitive speech and hypotension. Waxing and waning consciousness. EXAM: CT HEAD WITHOUT CONTRAST TECHNIQUE: Contiguous axial images were obtained from the base of the skull through the vertex without intravenous contrast. COMPARISON:  None. FINDINGS: Brain: No evidence of acute infarction, hemorrhage, hydrocephalus, extra-axial collection or mass lesion/mass effect. Vascular: No hyperdense vessel or unexpected calcification. Skull: Normal. Negative for fracture or focal lesion. Sinuses/Orbits: No acute finding. Other: None. IMPRESSION: 1. No acute intracranial abnormalities. The appearance of the brain is normal. Electronically Signed   By: Trudie Reed M.D.   On: Jul 12, 2016 12:42   US Abdomen Limited  Result Date: 12-Jul-2016 CLINICAL DATA:  Evaluate for ascites. EXAM: LIMITED ABDOMEN ULTRASOUND FOR ASCITES TECHNIQUE: Limited ultrasound survey for ascites was performed in all four abdominal quadrants. COMPARISON:  None. FINDINGS: No ascites is demonstrated within the abdomen. IMPRESSION: No evidence of ascites. Electronically Signed   By: Simonne Come M.D.   On: Jul 12, 2016 10:55   Dg Chest Port 1 View  Result Date: 06/14/2016 CLINICAL DATA:  Respiratory failure EXAM: PORTABLE CHEST 1 VIEW COMPARISON:  Jul 12, 2016 FINDINGS: Esophageal tube tip is below the diaphragm but is not included. Elevation of the right diaphragm. Very low lung volumes. Increased infiltrate in the right mid to upper lung. Slight increased left basilar infiltrate. Small left effusion. Stable  enlarged cardiomediastinal silhouette. Right upper extremity catheter tip projects over the low right atrium. No pneumothorax. IMPRESSION: 1. Right upper extremity catheter tip overlies the low right atrium 2. Low lung volumes. 3. Cardiomegaly with small bilateral effusions 4. Increased infiltrate in the right mid lung and increased atelectasis or infiltrate at the left lung base. Electronically Signed   By: Jasmine Pang M.D.   On: 06/14/2016 03:44    EKG:   Orders placed or performed during the hospital encounter of 07-12-2016  . EKG 12-Lead  . EKG 12-Lead    ASSESSMENT AND PLAN:   #1. Acute hepatic encephalopathy, Clinically improving continue  lactulose and Xifaxan, TPN started  get speech therapist evaluation and patient is more awake and alert as to ensure that patient is able to swallow medications #2. Anasarca secondary to third spacing from severe malnutrition #3. Acute renal failure, ATN patient was given IV fluids in the emergency room; creatinine 1.95-1.59 #4. Hyponatremia in fluid overloaded patient, clinically improving sodium at 134 today #5. Possible healthcare associated pneumonia, cannot rule out SBP, continue patient on broad-spectrum antibiotic therapy cefepime and vancomycin, follow-up cultures, may need a paracentesis. Leukocytosis is improving #6. Severe calorie and protein malnutrition, albumin 2.8 today . IV albumin, TPN started palliative care involved #7. Abnormal EKG with prolonged QTC, get magnesium level checked, hold QTC prolonging medications      All the records are reviewed and case discussed with Care Management/Social Workerr. Management plans discussed with the patient, family and they are in agreement.  CODE STATUS: FC   TOTAL TIME TAKING CARE OF THIS PATIENT: 36  minutes.   POSSIBLE D/C IN ? DAYS, DEPENDING ON CLINICAL CONDITION.  Note: This dictation was prepared with Dragon dictation along with smaller phrase technology. Any transcriptional  errors that  result from this process are unintentional.   Ramonita Lab M.D on 06/14/2016 at 1:24 PM  Between 7am to 6pm - Pager - [phone] After 6pm go to [email or web address] - password [email or web address]  [facility]  Office  [phone]  CC: Primary care physician; Pcp Not In System

## 2016-06-14 NOTE — Progress Notes (Signed)
Paged dietitian regarding tube feeding rate. Awaiting callback.

## 2016-06-14 NOTE — Progress Notes (Signed)
   06/14/16 1652  PT Visit Information  Reason Eval/Treat Not Completed Fatigue/lethargy limiting ability to participate  History of Present Illness Attempted to see pt however pt with decreased arousal, unable to keep eyes open and unable to carry on conversation.  Will contiue to follow as appropriate.  Brief chart review completed.

## 2016-06-14 NOTE — Progress Notes (Signed)
SATARA, VIRELLA ([id number]) Visit Report for 06/12/2016 Chief Complaint Document Details Patient Name: [name], [name] Date of Service: 06/12/2016 9:15 AM Medical Record Number: [id number] Patient Account Number: [id number] Date of Birth/Sex: 17-Sep-1960 (56 y.o. Female) Treating RN: Curtis Sites Primary Care Provider: SYSTEM, PCP Other Clinician: Referring Provider: Einar Crow Treating Provider/Extender: Rudene Re in Treatment: 1 Information Obtained from: Patient Chief Complaint Patient is at the clinic for treatment of an open pressure ulcer the left chest wall and back which she's had for about 3 weeks now. Electronic Signature(s) Signed: 06/12/2016 10:06:04 AM By: Evlyn Kanner MD, FACS Entered By: Evlyn Kanner on 06/12/2016 10:06:04 [name] ([id number]) -------------------------------------------------------------------------------- Debridement Details Patient Name: [name] Date of Service: 06/12/2016 9:15 AM Medical Record Number: [id number] Patient Account Number: [id number] Date of Birth/Sex: [birth year] (56 y.o. Female) Treating RN: Curtis Sites Primary Care Provider: SYSTEM, PCP Other Clinician: Referring Provider: Einar Crow Treating Provider/Extender: Rudene Re in Treatment: 1 Debridement Performed for Wound #1 Left Back Assessment: Performed By: Physician Evlyn Kanner, MD Debridement: Debridement Pre-procedure Yes - 09:34 Verification/Time Out Taken: Start Time: 09:34 Pain Control: Lidocaine 4% Topical Solution Level: Skin/Subcutaneous Tissue Total Area Debrided (L x 11 (cm) x 8 (cm) = 88 (cm) W): Tissue and other Viable, Non-Viable, Eschar, Fibrin/Slough, Subcutaneous material debrided: Instrument: Curette, Forceps, Scissors Bleeding: Moderate Hemostasis Achieved: Silver Nitrate End Time: 09:45 Procedural Pain: 0 Post Procedural Pain: 0 Response to Treatment: Procedure was tolerated well Post Debridement  Measurements of Total Wound Length: (cm) 18.7 Stage: Unstageable/Unclassified Width: (cm) 15.3 Depth: (cm) 0.5 Volume: (cm) 112.355 Character of Wound/Ulcer Post Improved Debridement: Severity of Tissue Post Fat layer exposed Debridement: Post Procedure Diagnosis Same as Pre-procedure Electronic Signature(s) Signed: 06/12/2016 10:08:37 AM By: Evlyn Kanner MD, FACS Signed: 06/12/2016 5:47:22 PM By: Curtis Sites Previous Signature: 06/12/2016 10:05:58 AM Version By: Evlyn Kanner MD, FACS [name], [name] ([id number]) Entered By: Evlyn Kanner on 06/12/2016 10:08:37 [name] ([id number]) -------------------------------------------------------------------------------- HPI Details Patient Name: [name] Date of Service: 06/12/2016 9:15 AM Medical Record Number: [id number] Patient Account Number: [id number] Date of Birth/Sex: 03-21-1960 (56 y.o. Female) Treating RN: Curtis Sites Primary Care Provider: SYSTEM, PCP Other Clinician: Referring Provider: Einar Crow Treating Provider/Extender: Rudene Re in Treatment: 1 History of Present Illness HPI Description: 56 year old gentleman with a known history of multiple medical problems, has recently been admitted to hospital and had diabetes mellitus several years ago, depression, hypertension, sleep apnea, anemia and status right BKA. He had a fall and had bilateral rib fractures and cellulitis of his chest wall which was treated with antibiotics. He was advised Augmentin and other treatment besides Santyl ointment locally. Discharge diagnoses include acute cystitis, chest wall cellulitis, multiple rib fractures more, compression injury to the skin, protein calorie malnutrition, status post right BKA. He is also known to have bariatric surgery, status post gastric resection, including a biliopancreatic diversion and duodenal switch procedure in August 2006 and hence had iron deficiency anemia. His past surgical history  also includes appendectomy, cholecystectomy, colon surgery, hernia repair. While in hospital the wound care nurse saw him for a unstageable left lateral flank injury. He has never been a smoker. Of note since his bariatric surgery he has not been treated for diabetes mellitus for several years and has normal hemoglobin A1c is checked every year Electronic Signature(s) Signed: 06/12/2016 10:06:09 AM By: Evlyn Kanner MD, FACS Entered By: Evlyn Kanner on 06/12/2016 10:06:09 [name] ([id number]) -------------------------------------------------------------------------------- Physical Exam Details Patient Name: [name] Date of Service: 06/12/2016 9:15  AM Medical Record Number: [id number] Patient Account Number: [id number] Date of Birth/Sex: 1960/10/24 (56 y.o. Female) Treating RN: Curtis Sites Primary Care Provider: SYSTEM, PCP Other Clinician: Referring Provider: Einar Crow Treating Provider/Extender: Rudene Re in Treatment: 1 Constitutional . Pulse regular. Respirations normal and unlabored. Afebrile. . Eyes Nonicteric. Reactive to light. Ears, Nose, Mouth, and Throat Lips, teeth, and gums WNL.Marland Kitchen Moist mucosa without lesions. Neck supple and nontender. No palpable supraclavicular or cervical adenopathy. Normal sized without goiter. Respiratory WNL. No retractions.. Breath sounds WNL, No rubs, rales, rhonchi, or wheeze.. Cardiovascular Heart rhythm and rate regular, no murmur or gallop.. Pedal Pulses WNL. No clubbing, cyanosis or edema. Chest Breasts symmetical and no nipple discharge.. Breast tissue WNL, no masses, lumps, or tenderness.. Lymphatic No adneopathy. No adenopathy. No adenopathy. Musculoskeletal Adexa without tenderness or enlargement.. Digits and nails w/o clubbing, cyanosis, infection, petechiae, ischemia, or inflammatory conditions.. Integumentary (Hair, Skin) No suspicious lesions. No crepitus or fluctuance. No peri-wound warmth or  erythema. No masses.Marland Kitchen Psychiatric Judgement and insight Intact.. No evidence of depression, anxiety, or agitation.. Notes on the left lateral chest wall extensive dissection was done with forceps scissors and a curette and a large amount of skin and subcutaneous tissue down to the fascia over the muscle was removed and thoroughly washed off and there is no active infection or cellulitis surrounding it. Electronic Signature(s) Signed: 06/12/2016 10:07:27 AM By: Evlyn Kanner MD, FACS Entered By: Evlyn Kanner on 06/12/2016 10:07:27 [name] ([id number]) -------------------------------------------------------------------------------- Physician Orders Details Patient Name: [name] Date of Service: 06/12/2016 9:15 AM Medical Record Number: [id number] Patient Account Number: [id number] Date of Birth/Sex: October 02, 1960 (56 y.o. Female) Treating RN: Curtis Sites Primary Care Provider: SYSTEM, PCP Other Clinician: Referring Provider: Einar Crow Treating Provider/Extender: Rudene Re in Treatment: 1 Verbal / Phone Orders: No Diagnosis Coding Wound Cleansing Wound #1 Left Back o Clean wound with Normal Saline. o May shower with protection. Anesthetic Wound #1 Left Back o Topical Lidocaine 4% cream applied to wound bed prior to debridement Primary Wound Dressing Wound #1 Left Back o Santyl Ointment - on eschar areas o Silvadene Cream - on raw areas Secondary Dressing Wound #1 Left Back o ABD pad - secure with tape o XtraSorb - if needed for drainage Dressing Change Frequency Wound #1 Left Back o Change dressing every day. Follow-up Appointments Wound #1 Left Back o Return Appointment in 1 week. Off-Loading Wound #1 Left Back o Other: - keep pressure off left back Additional Orders / Instructions Wound #1 Left Back o Increase protein intake. [name], [name] ([id number]) Electronic Signature(s) Signed: 06/12/2016 4:16:12 PM By: Evlyn Kanner  MD, FACS Signed: 06/12/2016 5:47:22 PM By: Curtis Sites Entered By: Curtis Sites on 06/12/2016 09:46:37 [name] ([id number]) -------------------------------------------------------------------------------- Problem List Details Patient Name: [name] Date of Service: 06/12/2016 9:15 AM Medical Record Number: [id number] Patient Account Number: [id number] Date of Birth/Sex: 1960/09/08 (56 y.o. Female) Treating RN: Curtis Sites Primary Care Provider: SYSTEM, PCP Other Clinician: Referring Provider: Einar Crow Treating Provider/Extender: Rudene Re in Treatment: 1 Active Problems ICD-10 Encounter Code Description Active Date Diagnosis L89.129 Pressure ulcer of left upper back, unspecified stage 06/05/2016 Yes E44.0 Moderate protein-calorie malnutrition 06/05/2016 Yes E66.01 Morbid (severe) obesity due to excess calories 06/05/2016 Yes Z89.511 Acquired absence of right leg below knee 06/05/2016 Yes Inactive Problems Resolved Problems Electronic Signature(s) Signed: 06/12/2016 10:04:39 AM By: Evlyn Kanner MD, FACS Entered By: Evlyn Kanner on 06/12/2016 10:04:39 [name] ([id number]) -------------------------------------------------------------------------------- Progress Note Details Patient Name: [name] Date  of Service: 06/12/2016 9:15 AM Medical Record Number: [id number] Patient Account Number: [id number] Date of Birth/Sex: [birth year] (56 y.o. Female) Treating RN: Curtis Sites Primary Care Provider: SYSTEM, PCP Other Clinician: Referring Provider: Einar Crow Treating Provider/Extender: Rudene Re in Treatment: 1 Subjective Chief Complaint Information obtained from Patient Patient is at the clinic for treatment of an open pressure ulcer the left chest wall and back which she's had for about 3 weeks now. History of Present Illness (HPI) 56 year old gentleman with a known history of multiple medical problems, has recently been  admitted to hospital and had diabetes mellitus several years ago, depression, hypertension, sleep apnea, anemia and status right BKA. He had a fall and had bilateral rib fractures and cellulitis of his chest wall which was treated with antibiotics. He was advised Augmentin and other treatment besides Santyl ointment locally. Discharge diagnoses include acute cystitis, chest wall cellulitis, multiple rib fractures more, compression injury to the skin, protein calorie malnutrition, status post right BKA. He is also known to have bariatric surgery, status post gastric resection, including a biliopancreatic diversion and duodenal switch procedure in August 2006 and hence had iron deficiency anemia. His past surgical history also includes appendectomy, cholecystectomy, colon surgery, hernia repair. While in hospital the wound care nurse saw him for a unstageable left lateral flank injury. He has never been a smoker. Of note since his bariatric surgery he has not been treated for diabetes mellitus for several years and has normal hemoglobin A1c is checked every year Objective Constitutional Pulse regular. Respirations normal and unlabored. Afebrile. Vitals Time Taken: 9:16 AM, Temperature: 98.0 F, Pulse: 97 bpm, Respiratory Rate: 18 breaths/min, Blood Pressure: 84/48 mmHg. Eyes Nonicteric. Reactive to light. Ears, Nose, Mouth, and Throat [name], [name] ([id number]) Lips, teeth, and gums WNL.Marland Kitchen Moist mucosa without lesions. Neck supple and nontender. No palpable supraclavicular or cervical adenopathy. Normal sized without goiter. Respiratory WNL. No retractions.. Breath sounds WNL, No rubs, rales, rhonchi, or wheeze.. Cardiovascular Heart rhythm and rate regular, no murmur or gallop.. Pedal Pulses WNL. No clubbing, cyanosis or edema. Chest Breasts symmetical and no nipple discharge.. Breast tissue WNL, no masses, lumps, or tenderness.. Lymphatic No adneopathy. No adenopathy. No  adenopathy. Musculoskeletal Adexa without tenderness or enlargement.. Digits and nails w/o clubbing, cyanosis, infection, petechiae, ischemia, or inflammatory conditions.Marland Kitchen Psychiatric Judgement and insight Intact.. No evidence of depression, anxiety, or agitation.. General Notes: on the left lateral chest wall extensive dissection was done with forceps scissors and a curette and a large amount of skin and subcutaneous tissue down to the fascia over the muscle was removed and thoroughly washed off and there is no active infection or cellulitis surrounding it. Integumentary (Hair, Skin) No suspicious lesions. No crepitus or fluctuance. No peri-wound warmth or erythema. No masses.. Wound #1 status is Open. Original cause of wound was Pressure Injury. The wound is located on the Left Back. The wound measures 18.7cm length x 15.3cm width x 0.2cm depth; 224.71cm^2 area and 44.942cm^3 volume. The wound is limited to skin breakdown. There is no tunneling or undermining noted. There is a large amount of serous drainage noted. The wound margin is flat and intact. There is small (1-33%) red granulation within the wound bed. There is a large (67-100%) amount of necrotic tissue within the wound bed including Eschar and Adherent Slough. The periwound skin appearance did not exhibit: Callus, Crepitus, Excoriation, Induration, Rash, Scarring, Dry/Scaly, Maceration, Atrophie Blanche, Cyanosis, Ecchymosis, Hemosiderin Staining, Mottled, Pallor, Rubor, Erythema. Periwound temperature was noted as No Abnormality. The  periwound has tenderness on palpation. Assessment Active Problems ICD-10 [name], [name] ([id number]) L89.129 - Pressure ulcer of left upper back, unspecified stage E44.0 - Moderate protein-calorie malnutrition E66.01 - Morbid (severe) obesity due to excess calories Z89.511 - Acquired absence of right leg below knee Procedures Wound #1 Wound #1 is a Pressure Ulcer located on the Left Back . There  was a Skin/Subcutaneous Tissue Debridement ([id number]) debridement with total area of 88 sq cm performed by Evlyn Kanner, MD. with the following instrument(s): Curette, Forceps, and Scissors to remove Viable and Non-Viable tissue/material including Fibrin/Slough, Eschar, and Subcutaneous after achieving pain control using Lidocaine 4% Topical Solution. A time out was conducted at 09:34, prior to the start of the procedure. A Moderate amount of bleeding was controlled with Silver Nitrate. The procedure was tolerated well with a pain level of 0 throughout and a pain level of 0 following the procedure. Post Debridement Measurements: 18.7cm length x 15.3cm width x 0.5cm depth; 112.355cm^3 volume. Post debridement Stage noted as Unstageable/Unclassified. Character of Wound/Ulcer Post Debridement is improved. Severity of Tissue Post Debridement is: Fat layer exposed. Post procedure Diagnosis Wound #1: Same as Pre-Procedure Plan Wound Cleansing: Wound #1 Left Back: Clean wound with Normal Saline. May shower with protection. Anesthetic: Wound #1 Left Back: Topical Lidocaine 4% cream applied to wound bed prior to debridement Primary Wound Dressing: Wound #1 Left Back: Santyl Ointment - on eschar areas Silvadene Cream - on raw areas Secondary Dressing: Wound #1 Left Back: ABD pad - secure with tape XtraSorb - if needed for drainage Dressing Change Frequency: Wound #1 Left Back: [name] ([id number]) Change dressing every day. Follow-up Appointments: Wound #1 Left Back: Return Appointment in 1 week. Off-Loading: Wound #1 Left Back: Other: - keep pressure off left back Additional Orders / Instructions: Wound #1 Left Back: Increase protein intake. after extensive debridement today I have recommended: 1. Silvadine ointment 1% to be applied to the area where he has superficial abrasions -- it is a more of carpet burns 2. The area where he is a full thickness unstageable pressure  injury I have made crosshatch is and we will continue with application of Santyl daily 3. Offloading this area has been discussed with him in great detail 4. Adequate protein, vitamin A, vitamin C and zinc 5. Regular visits to the wound center Electronic Signature(s) Signed: 06/12/2016 10:09:58 AM By: Evlyn Kanner MD, FACS Entered By: Evlyn Kanner on 06/12/2016 10:09:58 [name] ([id number]) -------------------------------------------------------------------------------- SuperBill Details Patient Name: [name] Date of Service: 06/12/2016 Medical Record Number: [id number] Patient Account Number: [id number] Date of Birth/Sex: [birth year] (56 y.o. Female) Treating RN: Curtis Sites Primary Care Provider: SYSTEM, PCP Other Clinician: Referring Provider: Einar Crow Treating Provider/Extender: Rudene Re in Treatment: 1 Diagnosis Coding ICD-10 Codes Code Description [phone] Pressure ulcer of left upper back, unspecified stage E44.0 Moderate protein-calorie malnutrition E66.01 Morbid (severe) obesity due to excess calories Z89.511 Acquired absence of right leg below knee Facility Procedures CPT4 Code: [id number] Description: 11042 - DEB SUBQ TISSUE 20 SQ CM/< ICD-10 Description Diagnosis L89.129 Pressure ulcer of left upper back, unspecified E44.0 Moderate protein-calorie malnutrition Z89.511 Acquired absence of right leg below knee Modifier: stage Quantity: 1 CPT4 Code: [id number] Description: 11045 - DEB SUBQ TISS EA ADDL 20CM ICD-10 Description Diagnosis L89.129 Pressure ulcer of left upper back, unspecified E44.0 Moderate protein-calorie malnutrition E66.01 Morbid (severe) obesity due to excess calories Modifier: stage Quantity: 4 Physician Procedures CPT4 Code: [id number] Description: 11042 - WC PHYS SUBQ TISS 20 SQ CM ICD-10  Description Diagnosis L89.129 Pressure ulcer of left upper back, unspecified s E44.0 Moderate protein-calorie malnutrition Z89.511 Acquired  absence of right leg below knee Modifier: tage Quantity: 1 CPT4 Code: [id number] [name] (0 Description: 11045 - WC PHYS SUBQ TISS EA ADDL 20 CM Description [id number]) Modifier: Quantity: 4 Electronic Signature(s) Signed: 06/12/2016 10:10:17 AM By: Evlyn Kanner MD, FACS Entered By: Evlyn Kanner on 06/12/2016 10:10:17

## 2016-06-14 NOTE — Progress Notes (Signed)
eLink Physician-Brief Progress Note Patient Name: [name] DOB: [birth year] MRN: [id number]   Date of Service  06/14/2016  HPI/Events of Note  K = 3.3 and Creatinine = 1.59.  eICU Interventions  Will replace K+.     Intervention Category Major Interventions: Electrolyte abnormality - evaluation and management  Sommer,Steven Eugene 06/14/2016, 6:23 AM

## 2016-06-14 NOTE — Progress Notes (Addendum)
[name], [name] ([id number]) Visit Report for 06/12/2016 Arrival Information Details Patient Name: [name], [name] Date of Service: 06/12/2016 9:15 AM Medical Record Number: [id number] Patient Account Number: [id number] Date of Birth/Sex: Mar 08, 1960 (56 y.o. Female) Treating RN: Curtis Sites Primary Care Terin Cragle: SYSTEM, PCP Other Clinician: Referring Gaye Scorza: Einar Crow Treating Marckus Hanover/Extender: Rudene Re in Treatment: 1 Visit Information History Since Last Visit Added or deleted any medications: No Patient Arrived: Wheel Chair Any new allergies or adverse reactions: No Arrival Time: 09:16 Had a fall or experienced change in No activities of daily living that may affect Accompanied By: self risk of falls: Transfer Assistance: None Signs or symptoms of abuse/neglect since last No Patient Identification Verified: Yes visito Secondary Verification Process Yes Hospitalized since last visit: No Completed: Has Dressing in Place as Prescribed: Yes Pain Present Now: No Electronic Signature(s) Signed: 06/12/2016 5:47:22 PM By: Curtis Sites Entered By: Curtis Sites on 06/12/2016 09:16:42 [name] ([id number]) -------------------------------------------------------------------------------- Encounter Discharge Information Details Patient Name: [name] Date of Service: 06/12/2016 9:15 AM Medical Record Number: [id number] Patient Account Number: [id number] Date of Birth/Sex: [birth year] (56 y.o. Female) Treating RN: Curtis Sites Primary Care Manly Nestle: SYSTEM, PCP Other Clinician: Referring Yari Szeliga: Einar Crow Treating Daymon Hora/Extender: Rudene Re in Treatment: 1 Encounter Discharge Information Items Discharge Pain Level: 0 Discharge Condition: Stable Ambulatory Status: Wheelchair Discharge Destination: Nursing Home Transportation: Private Auto Accompanied By: self Schedule Follow-up Appointment: Yes Medication Reconciliation completed and  provided to Patient/Care No Dorlisa Savino: Provided on Clinical Summary of Care: 06/12/2016 Form Type Recipient Paper Patient KW Electronic Signature(s) Signed: 06/12/2016 5:47:22 PM By: Curtis Sites Previous Signature: 06/12/2016 10:01:44 AM Version By: Gwenlyn Perking Entered By: Curtis Sites on 06/12/2016 11:32:04 [name] ([id number]) -------------------------------------------------------------------------------- Multi Wound Chart Details Patient Name: [name] Date of Service: 06/12/2016 9:15 AM Medical Record Number: [id number] Patient Account Number: [id number] Date of Birth/Sex: 01-21-1961 (56 y.o. Female) Treating RN: Curtis Sites Primary Care Lashawnda Hancox: SYSTEM, PCP Other Clinician: Referring Magdalynn Davilla: Einar Crow Treating Rodd Heft/Extender: Rudene Re in Treatment: 1 Vital Signs Height(in): Pulse(bpm): 97 Weight(lbs): Blood Pressure 84/48 (mmHg): Body Mass Index(BMI): Temperature(F): 98.0 Respiratory Rate 18 (breaths/min): Photos: [1:No Photos] [N/A:N/A] Wound Location: [1:Left Back] [N/A:N/A] Wounding Event: [1:Pressure Injury] [N/A:N/A] Primary Etiology: [1:Pressure Ulcer] [N/A:N/A] Comorbid History: [1:Anemia, Sleep Apnea, Hypertension, Type II Diabetes] [N/A:N/A] Date Acquired: [1:05/19/2016] [N/A:N/A] Weeks of Treatment: [1:1] [N/A:N/A] Wound Status: [1:Open] [N/A:N/A] Clustered Wound: [1:Yes] [N/A:N/A] Clustered Quantity: [1:4] [N/A:N/A] Measurements L x W x D 18.7x15.3x0.2 [N/A:N/A] (cm) Area (cm) : [1:224.71] [N/A:N/A] Volume (cm) : [1:44.942] [N/A:N/A] % Reduction in Area: [1:5.30%] [N/A:N/A] % Reduction in Volume: -89.30% [N/A:N/A] Classification: [1:Unstageable/Unclassified] [N/A:N/A] Exudate Amount: [1:Large] [N/A:N/A] Exudate Type: [1:Serous] [N/A:N/A] Exudate Color: [1:amber] [N/A:N/A] Wound Margin: [1:Flat and Intact] [N/A:N/A] Granulation Amount: [1:Small (1-33%)] [N/A:N/A] Granulation Quality: [1:Red]  [N/A:N/A] Necrotic Amount: [1:Large (67-100%)] [N/A:N/A] Necrotic Tissue: [1:Eschar, Adherent Slough] [N/A:N/A] Exposed Structures: [1:Fascia: No Fat Layer (Subcutaneous] [N/A:N/A] Tissue) Exposed: No Tendon: No Muscle: No Joint: No Bone: No Limited to Skin Breakdown Epithelialization: Small (1-33%) N/A N/A Debridement: Debridement (53664- N/A N/A 11047) Pre-procedure 09:34 N/A N/A Verification/Time Out Taken: Pain Control: Lidocaine 4% Topical N/A N/A Solution Tissue Debrided: Necrotic/Eschar, N/A N/A Fibrin/Slough, Subcutaneous Level: Skin/Subcutaneous N/A N/A Tissue Debridement Area (sq 286.11 N/A N/A cm): Instrument: Curette, Forceps, Scissors N/A N/A Bleeding: Moderate N/A N/A Hemostasis Achieved: Silver Nitrate N/A N/A Procedural Pain: 0 N/A N/A Post Procedural Pain: 0 N/A N/A Debridement Treatment Procedure was tolerated N/A N/A Response: well Post Debridement 18.7x15.3x0.5 N/A N/A Measurements L x  W x D (cm) Post Debridement 112.355 N/A N/A Volume: (cm) Post Debridement Unstageable/Unclassified N/A N/A Stage: Periwound Skin Texture: Excoriation: No N/A N/A Induration: No Callus: No Crepitus: No Rash: No Scarring: No Periwound Skin Maceration: No N/A N/A Moisture: Dry/Scaly: No Periwound Skin Color: Atrophie Blanche: No N/A N/A Cyanosis: No Ecchymosis: No Erythema: No Hemosiderin Staining: No Mottled: No [name], [name] ([id number]) Pallor: No Rubor: No Temperature: No Abnormality N/A N/A Tenderness on Yes N/A N/A Palpation: Wound Preparation: Ulcer Cleansing: N/A N/A Rinsed/Irrigated with Saline Topical Anesthetic Applied: Other: lidocaine 4% Procedures Performed: Debridement N/A N/A Treatment Notes Electronic Signature(s) Signed: 06/12/2016 10:04:44 AM By: Evlyn Kanner MD, FACS Entered By: Evlyn Kanner on 06/12/2016 10:04:44 [name]  ([id number]) -------------------------------------------------------------------------------- Multi-Disciplinary Care Plan Details Patient Name: [name] Date of Service: 06/12/2016 9:15 AM Medical Record Number: [id number] Patient Account Number: [id number] Date of Birth/Sex: Nov 07, 1960 (56 y.o. Female) Treating RN: Curtis Sites Primary Care Nephtali Docken: SYSTEM, PCP Other Clinician: Referring Lexx Monte: Einar Crow Treating Joud Pettinato/Extender: Rudene Re in Treatment: 1 Active Inactive Electronic Signature(s) Signed: 06/25/2016 9:03:26 AM By: Elliot Gurney RN, BSN, Kim RN, BSN Signed: 07/09/2016 8:01:56 AM By: Curtis Sites Previous Signature: 06/12/2016 5:47:22 PM Version By: Curtis Sites Entered By: Elliot Gurney RN, BSN, Kim on 06/25/2016 09:03:25 [name] ([id number]) -------------------------------------------------------------------------------- Pain Assessment Details Patient Name: [name] Date of Service: 06/12/2016 9:15 AM Medical Record Number: [id number] Patient Account Number: [id number] Date of Birth/Sex: 1961-02-12 (56 y.o. Female) Treating RN: Curtis Sites Primary Care Linn Clavin: SYSTEM, PCP Other Clinician: Referring Juliya Magill: Einar Crow Treating Elford Evilsizer/Extender: Rudene Re in Treatment: 1 Active Problems Location of Pain Severity and Description of Pain Patient Has Paino No Site Locations Pain Management and Medication Current Pain Management: Notes Topical or injectable lidocaine is offered to patient for acute pain when surgical debridement is performed. If needed, Patient is instructed to use over the counter pain medication for the following 24-48 hours after debridement. Wound care MDs do not prescribed pain medications. Patient has chronic pain or uncontrolled pain. Patient has been instructed to make an appointment with their Primary Care Physician for pain management. Electronic Signature(s) Signed: 06/12/2016 5:47:22 PM By:  Curtis Sites Entered By: Curtis Sites on 06/12/2016 09:16:52 [name] ([id number]) -------------------------------------------------------------------------------- Patient/Caregiver Education Details Patient Name: [name] Date of Service: 06/12/2016 9:15 AM Medical Record Number: [id number] Patient Account Number: [id number] Date of Birth/Gender: Jul 03, 1960 (56 y.o. Female) Treating RN: Curtis Sites Primary Care Physician: SYSTEM, PCP Other Clinician: Referring Physician: Einar Crow Treating Physician/Extender: Rudene Re in Treatment: 1 Education Assessment Education Provided To: Patient Education Topics Provided Wound/Skin Impairment: Handouts: Other: wound care as ordered Methods: Explain/Verbal Responses: State content correctly Notes written orders sent to SNF staff Electronic Signature(s) Signed: 06/12/2016 5:47:22 PM By: Curtis Sites Entered By: Curtis Sites on 06/12/2016 11:32:41 [name] ([id number]) -------------------------------------------------------------------------------- Wound Assessment Details Patient Name: [name] Date of Service: 06/12/2016 9:15 AM Medical Record Number: [id number] Patient Account Number: [id number] Date of Birth/Sex: [birth year] (56 y.o. Female) Treating RN: Curtis Sites Primary Care Cherrill Scrima: SYSTEM, PCP Other Clinician: Referring Ritik Stavola: Einar Crow Treating Leisel Pinette/Extender: Rudene Re in Treatment: 1 Wound Status Wound Number: 1 Primary Pressure Ulcer Etiology: Wound Location: Left Back Wound Open Wounding Event: Pressure Injury Status: Date Acquired: 05/19/2016 Comorbid Anemia, Sleep Apnea, Hypertension, Weeks Of Treatment: 1 History: Type II Diabetes Clustered Wound: Yes Wound Measurements Length: (cm) 18.7 Width: (cm) 15.3 Depth: (cm) 0.2 Clustered Quantity: 4 Area: (cm) 224.71 Volume: (cm) 44.942 % Reduction in Area: 5.3% %  Reduction in Volume:  -89.3% Epithelialization: Small (1-33%) Tunneling: No Undermining: No Wound Description Classification: Unstageable/Unclassified Wound Margin: Flat and Intact Exudate Amount: Large Exudate Type: Serous Exudate Color: amber Foul Odor After Cleansing: No Slough/Fibrino Yes Wound Bed Granulation Amount: Small (1-33%) Exposed Structure Granulation Quality: Red Fascia Exposed: No Necrotic Amount: Large (67-100%) Fat Layer (Subcutaneous Tissue) Exposed: No Necrotic Quality: Eschar, Adherent Slough Tendon Exposed: No Muscle Exposed: No Joint Exposed: No Bone Exposed: No Limited to Skin Breakdown Periwound Skin Texture Texture Color No Abnormalities Noted: No No Abnormalities Noted: No Callus: No Atrophie Blanche: No Crepitus: No Cyanosis: No Excoriation: No Ecchymosis: No [name], [name] ([id number]) Induration: No Erythema: No Rash: No Hemosiderin Staining: No Scarring: No Mottled: No Pallor: No Moisture Rubor: No No Abnormalities Noted: No Dry / Scaly: No Temperature / Pain Maceration: No Temperature: No Abnormality Tenderness on Palpation: Yes Wound Preparation Ulcer Cleansing: Rinsed/Irrigated with Saline Topical Anesthetic Applied: Other: lidocaine 4%, Electronic Signature(s) Signed: 06/12/2016 5:47:22 PM By: Curtis Sites Entered By: Curtis Sites on 06/12/2016 09:28:56 [name] ([id number]) -------------------------------------------------------------------------------- Vitals Details Patient Name: [name] Date of Service: 06/12/2016 9:15 AM Medical Record Number: [id number] Patient Account Number: [id number] Date of Birth/Sex: 08-Nov-1960 (56 y.o. Female) Treating RN: Curtis Sites Primary Care Romain Erion: SYSTEM, PCP Other Clinician: Referring Laykin Rainone: Einar Crow Treating Burns Timson/Extender: Rudene Re in Treatment: 1 Vital Signs Time Taken: 09:16 Temperature (F): 98.0 Pulse (bpm): 97 Respiratory Rate (breaths/min): 18 Blood  Pressure (mmHg): 84/48 Reference Range: 80 - 120 mg / dl Electronic Signature(s) Signed: 06/12/2016 5:47:22 PM By: Curtis Sites Entered By: Curtis Sites on 06/12/2016 09:18:54

## 2016-06-15 LAB — PROCALCITONIN: PROCALCITONIN: 0.49 ng/mL

## 2016-06-15 LAB — MAGNESIUM: MAGNESIUM: 1.9 mg/dL (ref 1.7–2.4)

## 2016-06-15 LAB — GLUCOSE, CAPILLARY
GLUCOSE-CAPILLARY: 138 mg/dL — AB (ref 65–99)
GLUCOSE-CAPILLARY: 140 mg/dL — AB (ref 65–99)
GLUCOSE-CAPILLARY: 162 mg/dL — AB (ref 65–99)
Glucose-Capillary: 133 mg/dL — ABNORMAL HIGH (ref 65–99)
Glucose-Capillary: 134 mg/dL — ABNORMAL HIGH (ref 65–99)
Glucose-Capillary: 152 mg/dL — ABNORMAL HIGH (ref 65–99)
Glucose-Capillary: 167 mg/dL — ABNORMAL HIGH (ref 65–99)

## 2016-06-15 LAB — CORTISOL: Cortisol, Plasma: 11.9 ug/dL

## 2016-06-15 LAB — PHOSPHORUS: PHOSPHORUS: 2.4 mg/dL — AB (ref 2.5–4.6)

## 2016-06-15 LAB — AMMONIA: Ammonia: 74 umol/L — ABNORMAL HIGH (ref 9–35)

## 2016-06-15 MED ORDER — GUAIFENESIN 100 MG/5ML PO SOLN
5.0000 mL | ORAL | Status: DC | PRN
  Administered 2016-06-15: 100 mg via ORAL
  Filled 2016-06-15 (×2): qty 5

## 2016-06-15 MED ORDER — VITAL HIGH PROTEIN PO LIQD: 1000.0000 mL | ORAL | Status: DC

## 2016-06-15 MED ORDER — ORAL CARE MOUTH RINSE
15.0000 mL | Freq: Two times a day (BID) | OROMUCOSAL | Status: DC
  Administered 2016-06-15 – 2016-06-20 (×13): 15 mL via OROMUCOSAL

## 2016-06-15 MED ORDER — ENOXAPARIN SODIUM 40 MG/0.4ML ~~LOC~~ SOLN
40.0000 mg | SUBCUTANEOUS | Status: DC
  Administered 2016-06-15 – 2016-06-20 (×6): 40 mg via SUBCUTANEOUS
  Filled 2016-06-15 (×7): qty 0.4

## 2016-06-15 MED ORDER — POTASSIUM CHLORIDE 20 MEQ/15ML (10%) PO SOLN
40.0000 meq | Freq: Two times a day (BID) | ORAL | Status: AC
  Administered 2016-06-15 (×2): 40 meq
  Filled 2016-06-15 (×2): qty 30

## 2016-06-15 MED ORDER — VITAL AF 1.2 CAL PO LIQD
1000.0000 mL | ORAL | Status: DC
  Administered 2016-06-15 – 2016-06-17 (×4): 1000 mL

## 2016-06-15 MED ORDER — POTASSIUM CHLORIDE 2 MEQ/ML IV SOLN
30.0000 meq | Freq: Once | INTRAVENOUS | Status: AC
  Administered 2016-06-15: 30 meq via INTRAVENOUS
  Filled 2016-06-15: qty 15

## 2016-06-15 MED ORDER — VITAL HIGH PROTEIN PO LIQD
1000.0000 mL | ORAL | Status: DC
  Administered 2016-06-15: 1000 mL

## 2016-06-15 NOTE — Progress Notes (Signed)
eLink Physician-Brief Progress Note Patient Name: [name] DOB: 08-Sep-1960 MRN: [id number]   Date of Service  06/15/2016  HPI/Events of Note  Nursing requesting flexiseal  eICU Interventions  Chart reviewed/ approved flexiseal     Intervention Category Minor Interventions: Routine modifications to care plan (e.g. PRN medications for pain, fever)  Sandrea Hughs 06/15/2016, 4:51 PM

## 2016-06-15 NOTE — Progress Notes (Signed)
Alert, appropriate. Oriented. No distress. No new complaints  Vitals:   06/15/16 1100 06/15/16 1102 06/15/16 1129 06/15/16 1200  BP: (!) 102/56   (!) 88/51  Pulse: 89 91  83  Resp: 16 (!) 21  16  Temp:   98 F (36.7 C)   TempSrc:   Oral   SpO2: (!) 88% 91%  92%  Weight:      Height:       NAD HEENT WNL JVP cannot be visualized Coarse BS, no wheezes Reg, no M Abdomen soft, NT Severe anasarca R BKA   CMP Latest Ref Rng & Units 06/15/2016 06/14/2016 2016-07-11  Glucose 65 - 99 mg/dL 295(A) 213(Y) 79  BUN 6 - 20 mg/dL 16 86(V) 78(I)  Creatinine 0.61 - 1.24 mg/dL 6.96(E) 9.52(W) 4.13(K)  Sodium 135 - 145 mmol/L 137 134(L) 132(L)  Potassium 3.5 - 5.1 mmol/L 3.4(L) 3.1(L) 3.6  Chloride 101 - 111 mmol/L 112(H) 108 104  CO2 22 - 32 mmol/L 20(L) 20(L) 22  Calcium 8.9 - 10.3 mg/dL 7.7(L) 7.8(L) 7.5(L)  Total Protein 6.5 - 8.1 g/dL 4.4(W) 6.0(L) 6.4(L)  Total Bilirubin 0.3 - 1.2 mg/dL 1.0(U) 7.2(Z) 1.2  Alkaline Phos 38 - 126 U/L 96 125 162(H)  AST 15 - 41 U/L 21 28 35  ALT 17 - 63 U/L CBC Latest Ref Rng & Units 06/15/2016 06/14/2016 07-11-16  WBC 3.8 - 10.6 K/uL 8.5 10.6 17.1(H)  Hemoglobin 13.0 - 18.0 g/dL 7.3(L) 8.3(L) 10.2(L)  Hematocrit 40.0 - 52.0 % 22.6(L) 24.7(L) 31.4(L)  Platelets 150 - 440 K/uL 175 231 317   CXR: NNF  IMPRESSION: Chronic severe debilitation Chronic severe protein-calorie malnutrition with severe hypoalbuminemia  Chronic anasarca due to hypoalbuminemia Hypotension on vasopressors Acute encephalopathy with elevated ammonia - no prior history of liver disease  Chronic opioid use (possible abuse)  Much improved AKI, nonoliguric - Cr improving Possible severe sepsis - minimally elevated PCT  Possible PNA  L flank skin ulcer - does not appear overtly infected  PLAN/REC: Cont current abx Wean NE to off as able for MAP > 60 mmHg Continue nutritional support - advance slowly to prevent refeeding syndrome Monitor BMET intermittently Monitor  I/Os Correct electrolytes as indicated Cont current abx Follow micro results Monitor temp, WBC count Began discussion re: goals of care 04/22 He is a poor candidate for intubation/mech vent and for ACLS given severe chronic debilitation   Billy Fischer, MD PCCM service Mobile [phone] Pager [phone] 06/15/2016 1:16 PM

## 2016-06-15 NOTE — Progress Notes (Signed)
Initial Nutrition Assessment  DOCUMENTATION CODES:   Severe malnutrition in context of chronic illness  INTERVENTION:  1. Currently receiving VHP @ 2mL/hr per Dr. Sung Amabile 2. To meet needs, patient likely requires Vital 1.2 @ 46mL/hr --> provides 2016 calories, 126gm protein, 1362cc free water Questionable whether patient will tolerate TF at this rate. Switch to Vital 1.2 - @ 22mL/hr - increase by 10cc daily  NUTRITION DIAGNOSIS:   Malnutrition related to chronic illness as evidenced by severe depletion of body fat, severe depletion of muscle mass, severe fluid accumulation. -not meeting  GOAL:   Patient will meet greater than or equal to 90% of their needs -progressing  MONITOR:   PO intake, Supplement acceptance, Labs, Weight trends  REASON FOR ASSESSMENT:   Consult Assessment of nutrition requirement/status  ASSESSMENT:   [name]  is a 56 y.o. female with a known history of Multiple medical problems including diabetes, diabetic neuropathy, iron deficiency anemia, status post gastric bypass, status post right BKA, peripheral vascular disease, recent admission for multiple rib fractures.  TFs started due to inability to swallow r/t acute hepatic encephalopathy.  Seems to be tolerating thus far. Will monitor. Speech eval per MD this week.  Diet Order:     Skin:  Wound (see comment) (ulceration on leg, stage III back post fall)  Last BM:  05/27/2016  Height:   Ht Readings from Last 1 Encounters:  06/06/2016  (1.702 m)    Weight:   Wt Readings from Last 1 Encounters:  06/15/16 248 lb 3.8 oz (112.6 kg)    Ideal Body Weight:  75.9 kg  BMI:  Body mass index is 38.88 kg/m.  Estimated Nutritional Needs:   Kcal:  2100-2400  Protein:  120-150 g  Fluid:  >/= 2 L  EDUCATION NEEDS:   No education needs identified at this time  Dionne Ano. Izrael Peak, MS, RD LDN Inpatient Clinical Dietitian Pager [phone]

## 2016-06-15 NOTE — Clinical Social Work Note (Signed)
Clinical Social Work Assessment  Patient Details  Name: [name] MRN: [id number] Date of Birth: Feb 17, 1961  Date of referral:  06/15/16               Reason for consult:  Facility Placement                Permission sought to share information with:  Facility Industrial/product designer granted to share information::  Yes, Verbal Permission Granted  Name::        Agency::     Relationship::     Contact Information:     Housing/Transportation Living arrangements for the past 2 months:  Skilled Nursing Facility Source of Information:  Facility, Medical Team Patient Interpreter Needed:  None Criminal Activity/Legal Involvement Pertinent to Current Situation/Hospitalization:  No - Comment as needed Significant Relationships:  Other Family Members Lives with:  Self Do you feel safe going back to the place where you live?  Yes Need for family participation in patient care:  Yes (Comment)  Care giving concerns:  Patient admitted from [facility]   Social Worker assessment / plan:  CSW contacted the patient's sister [name] to discuss dc planning. The patient's goal is to return to [facility], complete STR, and eventually return to his apartment. [facility] has confirmed that the patient can return when stable.  At baseline, the patient is WC bound but independent with ADLs prior to a fall that precipitated his STR needs.   Employment status:  Retired Database administrator PT Recommendations:  Not assessed at this time Information / Referral to community resources:     Patient/Family's Response to care:  Patient's sister thanked CSW  Patient/Family's Understanding of and Emotional Response to Diagnosis, Current Treatment, and Prognosis:  Patient's family in agreement with return to [facility] when patient is stable.  Emotional Assessment Appearance:  Appears stated age Attitude/Demeanor/Rapport:   (Alert, on pressors) Affect (typically  observed):  Appropriate Orientation:  Oriented to Self, Oriented to Place Alcohol / Substance use:  Never Used Psych involvement (Current and /or in the community):  No (Comment)  Discharge Needs  Concerns to be addressed:  Care Coordination Readmission within the last 30 days:  No Current discharge risk:  Chronically ill Barriers to Discharge:  Continued Medical Work up   [employer], LCSW 06/15/2016, 1:49 PM

## 2016-06-15 NOTE — Progress Notes (Addendum)
[facility] - [place] at [facility]   PATIENT NAME: [name]    MR#:  [id number]  DATE OF BIRTH:  Apr 26, 1960  SUBJECTIVE:  CHIEF COMPLAINT: Patient is Sleepy but arousable  REVIEW OF SYSTEMS:  CONSTITUTIONAL: No fever, fatigue or weakness.  EYES: No blurred or double vision.  EARS, NOSE, AND THROAT: No tinnitus or ear pain.  RESPIRATORY: No cough, shortness of breath, wheezing or hemoptysis.  CARDIOVASCULAR: No chest pain, orthopnea, edema.  GASTROINTESTINAL: No nausea, vomiting, diarrhea or abdominal pain.  GENITOURINARY: No dysuria, hematuria.  ENDOCRINE: No polyuria, nocturia,  HEMATOLOGY: No anemia, easy bruising or bleeding SKIN: No rash or lesion. MUSCULOSKELETAL: No joint pain or arthritis.   NEUROLOGIC: Intermittent episodes of confusion. No tingling, numbness PSYCHIATRY: No anxiety or depression.   DRUG ALLERGIES:  No Known Allergies  VITALS:  Blood pressure (!) 97/53, pulse 76, temperature 98.5 F (36.9 C), temperature source Oral, resp. rate 19, height  (1.702 m), weight 112.6 kg (248 lb 3.8 oz), SpO2 92 %.  PHYSICAL EXAMINATION:  GENERAL:  56 y.o.-year-old Caucasian  transgender patient  lying in the bed with no acute distress.  EYES: Pupils equal, round, reactive to light and accommodation. No scleral icterus. Extraocular muscles intact.  HEENT: Head atraumatic, normocephalic. Oropharynx and nasopharynx clear.  NECK:  Supple, no jugular venous distention. No thyroid enlargement, no tenderness.  LUNGS: Normal breath sounds bilaterally, no wheezing, rales,rhonchi or crepitation. No use of accessory muscles of respiration.  CARDIOVASCULAR: S1, S2 normal. No murmurs, rubs, or gallops.  ABDOMEN: Soft, nontender, distended. Ascites Bowel sounds present. No organomegaly or mass.  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  NEUROLOGIC: Alert but intermittent episodes of lethargy.  Sensation intact. Gait not checked.  PSYCHIATRIC: The patient is alert and intermittent episodes of lethargy but following commands.  SKIN: No obvious rash, lesion, or ulcer.    LABORATORY PANEL:   CBC  Recent Labs Lab 06/15/16 0438  WBC 8.5  HGB 7.3*  HCT 22.6*  PLT 175   ------------------------------------------------------------------------------------------------------------------  Chemistries   Recent Labs Lab 06/15/16 0438  NA 137  K 3.4*  CL 112*  CO2 20*  GLUCOSE 224*  BUN 16  CREATININE 1.33*  CALCIUM 7.7*  MG 1.9  AST 21  ALT 18  ALKPHOS 96  BILITOT 1.6*   ------------------------------------------------------------------------------------------------------------------  Cardiac Enzymes  Recent Labs Lab 06/14/16 0420  TROPONINI <0.03   ------------------------------------------------------------------------------------------------------------------  RADIOLOGY:  Dg Abd 1 View  Result Date: 06/23/16 CLINICAL DATA:  NG tube placement EXAM: ABDOMEN - 1 VIEW COMPARISON:  CT abdomen and pelvis 04/28/2015 FINDINGS: Enteric tube tip is in the right upper mid abdomen consistent with location in the lower stomach. Visualized small and large bowel are not abnormally distended. IMPRESSION: Enteric tube tip in the right upper mid abdomen consistent with location in the lower stomach. Electronically Signed   By: Burman Nieves M.D.   On: 06-23-16 22:30   Ct Head Wo Contrast  Result Date: 06/23/2016 CLINICAL DATA:  56 year old female with history of altered mental status, repetitive speech and hypotension. Waxing and waning consciousness. EXAM: CT HEAD WITHOUT CONTRAST TECHNIQUE: Contiguous axial images were obtained from the base of the skull through the vertex without intravenous contrast. COMPARISON:  None. FINDINGS: Brain: No evidence of acute infarction, hemorrhage, hydrocephalus, extra-axial collection or mass lesion/mass effect. Vascular: No hyperdense  vessel or unexpected calcification. Skull: Normal. Negative for fracture or focal lesion. Sinuses/Orbits:  No acute finding. Other: None. IMPRESSION: 1. No acute intracranial abnormalities. The appearance of the brain is normal. Electronically Signed   By: Trudie Reed M.D.   On: 2016/07/11 12:42   US Abdomen Limited  Result Date: Jul 11, 2016 CLINICAL DATA:  Evaluate for ascites. EXAM: LIMITED ABDOMEN ULTRASOUND FOR ASCITES TECHNIQUE: Limited ultrasound survey for ascites was performed in all four abdominal quadrants. COMPARISON:  None. FINDINGS: No ascites is demonstrated within the abdomen. IMPRESSION: No evidence of ascites. Electronically Signed   By: Simonne Come M.D.   On: 07/11/2016 10:55   Dg Chest Port 1 View  Result Date: 06/14/2016 CLINICAL DATA:  Respiratory failure EXAM: PORTABLE CHEST 1 VIEW COMPARISON:  07-11-2016 FINDINGS: Esophageal tube tip is below the diaphragm but is not included. Elevation of the right diaphragm. Very low lung volumes. Increased infiltrate in the right mid to upper lung. Slight increased left basilar infiltrate. Small left effusion. Stable enlarged cardiomediastinal silhouette. Right upper extremity catheter tip projects over the low right atrium. No pneumothorax. IMPRESSION: 1. Right upper extremity catheter tip overlies the low right atrium 2. Low lung volumes. 3. Cardiomegaly with small bilateral effusions 4. Increased infiltrate in the right mid lung and increased atelectasis or infiltrate at the left lung base. Electronically Signed   By: Jasmine Pang M.D.   On: 06/14/2016 03:44    EKG:   Orders placed or performed during the hospital encounter of 07-11-16  . EKG 12-Lead  . EKG 12-Lead    ASSESSMENT AND PLAN:   # Sepsis with hypotension secondary to  healthcare associated pneumonia, cannot rule out SBP, continue patient on broad-spectrum antibiotic therapy cefepime and vancomycin, MRSA PCR is negative and blood cultures are negative so far, may need a  paracentesis. Leukocytosis is improving. On Levophed GTT, goal is to keep MAP greater than 60 # Acute hepatic encephalopathy, Clinically improving continue  lactulose and Xifaxan, TPN started  get speech therapist evaluation and patient is more awake and alert as to ensure that patient is able to swallow medications #. Anasarca secondary to third spacing from severe malnutrition #. Acute renal failure, ATN patient was given IV fluids in the emergency room; creatinine 1.95-1.59-1..3 #. Hyponatremia in fluid overloaded patient, clinically resolved sodium at 137 today # Severe calorie and protein malnutrition, albumin 2.8 today . IV albumin, TPN started palliative care involved #. Abnormal EKG with prolonged QTC, get magnesium level checked, hold QTC prolonging medications      All the records are reviewed and case discussed with Care Management/Social Workerr. Management plans discussed with the patient, family and they are in agreement.  CODE STATUS: FC   TOTAL TIME TAKING CARE OF THIS PATIENT: 35  minutes.   POSSIBLE D/C IN ? DAYS, DEPENDING ON CLINICAL CONDITION.  Note: This dictation was prepared with Dragon dictation along with smaller phrase technology. Any transcriptional errors that result from this process are unintentional.   Ramonita Lab M.D on 06/15/2016 at 7:45 AM  Between 7am to 6pm - Pager - [phone] After 6pm go to [email or web address] - password [email or web address]  [facility]  Office  [phone]  CC: Primary care physician; Pcp Not In System

## 2016-06-15 NOTE — Progress Notes (Signed)
Anticoagulation monitoring(Lovenox):  55yo  female ordered Lovenox 30 mg Q24h  Filed Weights   06-16-2016 1035 06/14/16 0418 06/15/16 0500  Weight: 247 lb 9.2 oz (112.3 kg) 249 lb 1.9 oz (113 kg) 248 lb 3.8 oz (112.6 kg)   BMI 39   Lab Results  Component Value Date   CREATININE 1.33 (H) 06/15/2016   CREATININE 1.59 (H) 06/14/2016   CREATININE 1.95 (H) 16-Jun-2016   Estimated Creatinine Clearance: 76.1 mL/min (A) (by C-G formula based on SCr of 1.33 mg/dL (H)). Hemoglobin & Hematocrit     Component Value Date/Time   HGB 7.3 (L) 06/15/2016 0438   HGB 8.2 (L) 04/03/2014 1545   HCT 22.6 (L) 06/15/2016 0438   HCT 26.2 (L) 04/03/2014 1545   HCT 29.0 (L) 03/08/2014 1000     Per Protocol for Patient with estCrcl >30 ml/min and BMI < 40, will transition to Lovenox 40 mg Q24h.

## 2016-06-15 NOTE — Evaluation (Signed)
Physical Therapy Evaluation Patient Details Name: [name] MRN: [id number] DOB: 04-19-1960 Today's Date: 06/15/2016   History of Present Illness  Pt admitted for complaints of AMS. Pt now diagnosed with anasarca. History includes DM, anemia, R BKA, and PVD. Pt is unreliable historian secondary to confusion. Pt currently on 6L of O2 with low BP.  Clinical Impression  Pt is a pleasant 56 year old transgender female who now identifies as a female. He is admitted for anasarca. Pt unable to attempt OOB mobility as he desats quickly with exertion and talking while on 6L of O2. BP also very soft this date (95/55). Exercises attempted, however pt then had BM in bed, RN called for assistance. Pt demonstrates deficits with strength/mobility/endurance. Pt is poor historian and reports he has completed rehab and is now back at home. Per chart review, pt is still at [facility] participating in rehab and is WC bound. Pt reports at baseline he "slides" into WC without slideboard, but also doesn't ambulate. Would benefit from skilled PT to address above deficits and promote optimal return to PLOF; recommend transition to STR upon discharge from acute hospitalization.       Follow Up Recommendations SNF    Equipment Recommendations  None recommended by PT    Recommendations for Other Services       Precautions / Restrictions Precautions Precautions: Fall Restrictions Weight Bearing Restrictions: No      Mobility  Bed Mobility               General bed mobility comments: Pt too weak for attempt per pt. Pt on 6L of O2 and de-sats with exertion and strength testing. Pt just transitioned from venturi mask to [place]. Not attempted at this time  Transfers                    Ambulation/Gait             General Gait Details: unable, does not have prosthesis and not ambulatory at baseline  Stairs            Wheelchair Mobility    Modified Rankin (Stroke Patients Only)        Balance Overall balance assessment: History of Falls;Needs assistance                                           Pertinent Vitals/Pain Pain Assessment: No/denies pain    Home Living Family/patient expects to be discharged to:: Skilled nursing facility               Home Equipment: Dan Humphreys - 2 wheels;Wheelchair - power      Prior Function Level of Independence: Independent with assistive device(s)         Comments: transfers with no issue bed to chair usually; however per chart review, pt had fall from [facility] and is now currently receiving rehab.     Hand Dominance        Extremity/Trunk Assessment   Upper Extremity Assessment Upper Extremity Assessment: Generalized weakness (B UE grossly 4/5)    Lower Extremity Assessment Lower Extremity Assessment: Generalized weakness (B LE grossly 3+/5)       Communication   Communication: No difficulties  Cognition Arousal/Alertness: Awake/alert Behavior During Therapy: Flat affect Overall Cognitive Status: Impaired/Different from baseline  General Comments      Exercises     Assessment/Plan    PT Assessment Patient needs continued PT services  PT Problem List Decreased strength;Decreased range of motion;Decreased activity tolerance;Decreased balance;Decreased mobility;Decreased coordination;Decreased cognition;Decreased safety awareness;Cardiopulmonary status limiting activity;Obesity;Decreased skin integrity;Pain       PT Treatment Interventions Therapeutic activities;Therapeutic exercise;Balance training;Wheelchair mobility training    PT Goals (Current goals can be found in the Care Plan section)  Acute Rehab PT Goals Patient Stated Goal: to feel better and get stronger PT Goal Formulation: With patient Time For Goal Achievement: 06/29/16 Potential to Achieve Goals: Fair    Frequency Min 2X/week   Barriers to discharge         Co-evaluation               End of Session Equipment Utilized During Treatment: Oxygen Activity Tolerance: Patient limited by fatigue Patient left: in bed;with bed alarm set Nurse Communication: Mobility status PT Visit Diagnosis: History of falling (Z91.81);Muscle weakness (generalized) (M62.81)    Time: 4782-9562 PT Time Calculation (min) (ACUTE ONLY): 11 min   Charges:   PT Evaluation $PT Eval Moderate Complexity: 1 Procedure     PT G Codes:        Elizabeth Palau, PT, DPT [phone]   Melitta Tigue 06/15/2016, 2:47 PM

## 2016-06-16 ENCOUNTER — Inpatient Hospital Stay: Payer: 59

## 2016-06-16 LAB — GLUCOSE, CAPILLARY
GLUCOSE-CAPILLARY: 166 mg/dL — AB (ref 65–99)
Glucose-Capillary: 150 mg/dL — ABNORMAL HIGH (ref 65–99)
Glucose-Capillary: 142 mg/dL — ABNORMAL HIGH (ref 65–99)
Glucose-Capillary: 157 mg/dL — ABNORMAL HIGH (ref 65–99)
Glucose-Capillary: 127 mg/dL — ABNORMAL HIGH (ref 65–99)

## 2016-06-16 LAB — PHOSPHORUS: PHOSPHORUS: 1.6 mg/dL — AB (ref 2.5–4.6)

## 2016-06-16 LAB — MAGNESIUM: Magnesium: 2 mg/dL (ref 1.7–2.4)

## 2016-06-16 MED ORDER — DEXTROSE 5 % IV SOLN
2.0000 g | Freq: Three times a day (TID) | INTRAVENOUS | Status: DC
  Administered 2016-06-16 – 2016-06-20 (×11): 2 g via INTRAVENOUS
  Filled 2016-06-16 (×13): qty 2

## 2016-06-16 MED ORDER — HYDROCORTISONE NA SUCCINATE PF 100 MG IJ SOLR
50.0000 mg | Freq: Three times a day (TID) | INTRAMUSCULAR | Status: DC
  Administered 2016-06-17 – 2016-06-18 (×5): 50 mg via INTRAVENOUS
  Filled 2016-06-16 (×6): qty 2

## 2016-06-16 MED ORDER — POTASSIUM PHOSPHATES 15 MMOLE/5ML IV SOLN
30.0000 mmol | Freq: Once | INTRAVENOUS | Status: AC
  Administered 2016-06-16: 30 mmol via INTRAVENOUS
  Filled 2016-06-16: qty 10

## 2016-06-16 MED ORDER — PANCRELIPASE (LIP-PROT-AMYL) 12000-38000 UNITS PO CPEP
24000.0000 [IU] | ORAL_CAPSULE | Freq: Once | ORAL | Status: AC
  Administered 2016-06-16: 24000 [IU] via ORAL
  Filled 2016-06-16: qty 2

## 2016-06-16 MED ORDER — SODIUM BICARBONATE 650 MG PO TABS
650.0000 mg | ORAL_TABLET | Freq: Once | ORAL | Status: AC
  Administered 2016-06-16: 650 mg via ORAL
  Filled 2016-06-16: qty 1

## 2016-06-16 MED ORDER — HYDROMORPHONE HCL 1 MG/ML IJ SOLN
0.5000 mg | INTRAMUSCULAR | Status: DC | PRN
Start: 2016-06-16 — End: 2016-06-19
  Administered 2016-06-16 – 2016-06-19 (×5): 0.5 mg via INTRAVENOUS
  Filled 2016-06-16 (×5): qty 0.5

## 2016-06-16 NOTE — Progress Notes (Signed)
[facility] [place] [facility] Medicine Progess Note    SYNOPSIS   [name]  is a 56 y.o. 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 Now with possible sepsis and hepatic encephalopathy.   ASSESSMENT/PLAN   Hepatic Encephalopathy with hyperammonemia -Ammonia coming down, continue lactulose.   Severe malnutrition with hypoalbuminemia.  -Continue tube feeds.   Septic Shock with hypotension.  -Improving, continue abx.   AKI -Improved.   Multiple decub ulcers, poor wound healing due to malnutrition.  -Continue tube feeds, wound care following.    INTAKE / OUTPUT:  Intake/Output Summary (Last 24 hours) at 06/16/16 1607 Last data filed at 06/16/16 0500  Gross per 24 hour  Intake            317.6 ml  Output             1450 ml  Net          -1132.4 ml    Micro/culture results:  BCx2 4/20; negative.  UC -- Sputum.   Antibiotics: cefepime  4/20 >>   Vanc 4/20 >>  4/22  Best Practices  DVT Prophylaxis: enoxaparin.  GI Prophylaxis: famotidine.    ---------------------------------------   ----------------------------------------   Name: [name] MRN: [id number] DOB: 18-Jun-1960    ADMISSION DATE:  05/25/2016   SUBJECTIVE:   Pt currently on the lethargic can not provide history or review of systems.   Review of Systems:  --  VITAL SIGNS: Temp:  [97.6 F (36.4 C)-98.1 F (36.7 C)] 97.9 F (36.6 C) (04/23 1200) Pulse Rate:  [72-92] 88 (04/23 1500) Resp:  [12-22] 19 (04/23 1500) BP: (67-105)/(44-79) 105/68 (04/23 1500) SpO2:  [88 %-99 %] 92 % (04/23 1500) FiO2 (%):  [45 %] 45 % (04/22 1630) Weight:  [247 lb 2.2 oz (112.1 kg)] 247 lb 2.2 oz (112.1 kg) (04/23 0500)     PHYSICAL EXAMINATION: Physical  Examination:   VS: BP 105/68   Pulse 88   Temp 97.9 F (36.6 C) (Axillary)   Resp 19   Ht  (1.702 m) Comment: estimated  Wt 247 lb 2.2 oz (112.1 kg)   SpO2 92%   BMI 38.71 kg/m   General Appearance: No distress  Neuro:without focal findings, lethargic but arousable.  HEENT: PERRLA, EOM intact. Pulmonary: normal breath sounds   CardiovascularNormal S1,S2.  No m/r/g.   Abdomen: Benign, Soft, non-tender. Renal:  No costovertebral tenderness  GU:  Not performed at this time. Endocrine: No evident thyromegaly. Skin:   warm, no rashes, no ecchymosis  Extremities: normal, no cyanosis, clubbing.    LABORATORY PANEL:   CBC  Recent Labs Lab 06/16/16 0525  WBC 8.0  HGB 7.6*  HCT 23.1*  PLT 147*    Chemistries   Recent Labs Lab 06/16/16 0525  NA 140  K 3.8  CL 118*  CO2 21*  GLUCOSE 199*  BUN 13  CREATININE 1.12  CALCIUM 7.9*  MG 2.0  PHOS 1.6*  AST 19  ALT 18  ALKPHOS 103  BILITOT 1.1     Recent Labs Lab 06/15/16 1601 06/15/16 1952 06/15/16 2344 06/16/16 0342 06/16/16 0727 06/16/16 1102  GLUCAP 134* 133* 140* 150* 166* 142*   No results for input(s): PHART, PCO2ART, PO2ART in the last 168 hours.  Recent Labs Lab 06/14/16 0420 06/15/16 0438 06/16/16 0525  AST  ALT ALKPHOS 125 96 103  BILITOT 1.4* 1.6* 1.1  ALBUMIN 2.8* 3.0* 2.7*    Cardiac Enzymes  Recent Labs Lab 06/14/16 0420  TROPONINI <0.03    RADIOLOGY:  Dg Chest Port 1 View  Result Date: 06/16/2016 CLINICAL DATA:  Respiratory failure EXAM: PORTABLE CHEST 1 VIEW COMPARISON:  06/14/2016 FINDINGS: Cardiac shadow is stable. Right-sided PICC line and nasogastric catheter are again seen and stable. Patchy infiltrative changes are again seen bilaterally and stable. No new focal infiltrate is noted. No significant effusion is seen. No bony abnormality is noted. IMPRESSION: Stable patchy infiltrates bilaterally. Electronically Signed   By: Alcide Clever M.D.   On:  06/16/2016 08:04      --Wells Guiles, MD.  ICU Pager: [phone] [place] [facility] Office Number: [phone]   06/16/2016

## 2016-06-16 NOTE — Consult Note (Signed)
WOC Nurse wound consult note Reason for Consult: Full thickness pressure injury, unstageable to left lateral  flank.  Fall at home with prolonged time down Anasarca with weeping left lower extremity and nonintact lesion  Unstageable pressure injury to left ischial tuberosity  Moisture associated skin damage to bilateral buttocks, present on admission.  Wound type:Unstageable pressure injury Moisture associated skin damage Pressure Injury POA: Yes Measurement: 7 cm x 6 cm unable to visualize wound bed.  50% adherent devitalized tissue.  Left ischium:  2-  1 cm x 1 cm x 0.2 cm lesions Left lower leg:  2 cm x 2 cm x 0.5 cm 1 cm x 1.5 cm x 0.3 cm with surrounding  edema and weeping.  MASD to bilateral buttocks, 3 cm x 1 cm x 0.2 cm denuded skin with blanchable erythema Wound bed: devitalized tissue Drainage (amount, consistency, odor) weeping edema,  moderate serosanguinous.  No odor.  Periwound:Blanchable erythema Dressing procedure/placement/frequency:Cleanse wound to left lateral chest wall with NS and pat gently dry.  Apply Santyl to wound bed. Cover with NS moist gauze.  Secure with ABD pad and tape.  Change daily.  Cleanse buttocks with soap and water and pat gently dry.  Apply barrier cream twice daily.  Cleanse left ischial wound with NS and pat gently dry.  Apply Aquacel Ag to wound bed.  Cover with silicone border foam dressing.  Change Monday, Wednesday and Friday and PRN soilage.  Cleanse left lower leg with soap and water and pat dry. Apply Aquacel Ag to nonintact wound.  Cover with 4x4 gauze, kerlix and tape.  Change Monday, Wednesday and Friday.  Turn and reposition every two hours.  Offload heels.  Will not follow at this time.  Please re-consult if needed.  Maple Hudson RN BSN CWON Pager [phone]

## 2016-06-16 NOTE — Progress Notes (Signed)
Pharmacy Antibiotic Note  [name] is a 56 y.o. female admitted on 06/02/2016 with pneumonia.  Pharmacy has been consulted for Vancomycin and Cefepime dosing. Vancomycin d/c 4/22.  Plan: Will increase cefepime to 2 g iv q 8 hours due to improved SCr.   Height:  (170.2 cm) (estimated) Weight: 247 lb 2.2 oz (112.1 kg) IBW/kg (Calculated) : 66.1  Temp (24hrs), Avg:97.9 F (36.6 C), Min:97.6 F (36.4 C), Max:98.1 F (36.7 C)   Recent Labs Lab 06/22/2016 0606 06/14/16 0420 06/15/16 0438 06/16/16 0525  WBC 17.1* 10.6 8.5 8.0  CREATININE 1.95* 1.59* 1.33* 1.12  LATICACIDVEN 1.6  --   --   --     Estimated Creatinine Clearance: 90.1 mL/min (by C-G formula based on SCr of 1.12 mg/dL).    No Known Allergies  Antimicrobials this admission: cefepime  4/20 >>   Vanc 4/20 >>  4/22  Dose adjustments this admission:    Microbiology results: C diff: negative BCx: NGTD UCx:    Sputum:    MRSA PCR: negative  Thank you for allowing pharmacy to be a part of this patient's care.  Luisa Hart D 06/16/2016 2:05 PM

## 2016-06-16 NOTE — Progress Notes (Signed)
Wound care done this morning by Clydie Braun, RN during her wound assessment.

## 2016-06-16 NOTE — Progress Notes (Signed)
[facility] - [place] at [facility]   PATIENT NAME: [name]    MR#:  [id number]  DATE OF BIRTH:  April 19, 1960  SUBJECTIVE:  CHIEF COMPLAINT: Patient is Sleepy but arousable, levophed off   REVIEW OF SYSTEMS:  CONSTITUTIONAL: No fever, fatigue or weakness.  EYES: No blurred or double vision.  EARS, NOSE, AND THROAT: No tinnitus or ear pain.  RESPIRATORY: No cough, shortness of breath, wheezing or hemoptysis.  CARDIOVASCULAR: No chest pain, orthopnea, edema.  GASTROINTESTINAL: No nausea, vomiting, diarrhea or abdominal pain.  GENITOURINARY: No dysuria, hematuria.  ENDOCRINE: No polyuria, nocturia,  HEMATOLOGY: No anemia, easy bruising or bleeding SKIN: No rash or lesion. MUSCULOSKELETAL: No joint pain or arthritis.   NEUROLOGIC: Intermittent episodes of confusion. No tingling, numbness PSYCHIATRY: No anxiety or depression.   DRUG ALLERGIES:  No Known Allergies  VITALS:  Blood pressure 91/74, pulse 80, temperature 98.1 F (36.7 C), temperature source Axillary, resp. rate 16, height  (1.702 m), weight 112.1 kg (247 lb 2.2 oz), SpO2 92 %.  PHYSICAL EXAMINATION:  GENERAL:  56 y.o.-year-old Caucasian  transgender patient  lying in the bed with no acute distress.  EYES: Pupils equal, round, reactive to light and accommodation. No scleral icterus. Extraocular muscles intact.  HEENT: Head atraumatic, normocephalic. Oropharynx and nasopharynx clear.  NECK:  Supple, no jugular venous distention. No thyroid enlargement, no tenderness.  LUNGS: Normal breath sounds bilaterally, no wheezing, rales,rhonchi or crepitation. No use of accessory muscles of respiration.  CARDIOVASCULAR: S1, S2 normal. No murmurs, rubs, or gallops.  ABDOMEN: Soft, nontender, distended. Ascites Bowel sounds present. No organomegaly or mass.  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  NEUROLOGIC: Alert but intermittent episodes of  lethargy. Sensation intact. Gait not checked.  PSYCHIATRIC: The patient is alert and intermittent episodes of lethargy but following commands.  SKIN: No obvious rash, lesion, or ulcer.    LABORATORY PANEL:   CBC  Recent Labs Lab 06/16/16 0525  WBC 8.0  HGB 7.6*  HCT 23.1*  PLT 147*   ------------------------------------------------------------------------------------------------------------------  Chemistries   Recent Labs Lab 06/16/16 0525  NA 140  K 3.8  CL 118*  CO2 21*  GLUCOSE 199*  BUN 13  CREATININE 1.12  CALCIUM 7.9*  MG 2.0  AST 19  ALT 18  ALKPHOS 103  BILITOT 1.1   ------------------------------------------------------------------------------------------------------------------  Cardiac Enzymes  Recent Labs Lab 06/14/16 0420  TROPONINI <0.03   ------------------------------------------------------------------------------------------------------------------  RADIOLOGY:  Dg Chest Port 1 View  Result Date: 06/16/2016 CLINICAL DATA:  Respiratory failure EXAM: PORTABLE CHEST 1 VIEW COMPARISON:  06/14/2016 FINDINGS: Cardiac shadow is stable. Right-sided PICC line and nasogastric catheter are again seen and stable. Patchy infiltrative changes are again seen bilaterally and stable. No new focal infiltrate is noted. No significant effusion is seen. No bony abnormality is noted. IMPRESSION: Stable patchy infiltrates bilaterally. Electronically Signed   By: Alcide Clever M.D.   On: 06/16/2016 08:04    EKG:   Orders placed or performed during the hospital encounter of Jun 30, 2016  . EKG 12-Lead  . EKG 12-Lead    ASSESSMENT AND PLAN:   # Sepsis with hypotension secondary to  healthcare associated pneumonia, continue patient on broad-spectrum antibiotic therapy cefepime and vancomycin d/ced MRSA PCR is negative and blood cultures are negative so far, may need a paracentesis. Leukocytosis is improving. Given Levophed GTT ,off levophed drip oday goal is to keep  MAP greater than 60 #  Acute hepatic encephalopathy, Clinically improving continue  lactulose and Xifaxan, NG feeds  get speech therapist evaluation and patient is more awake and alert as to ensure that patient is able to swallow medications #. Anasarca secondary to third spacing from severe malnutrition #. Acute renal failure, ATN patient was given IV fluids in the emergency room; creatinine 1.95-1.59-1.Marland Kitchen3 --1.12 #. Hyponatremia in fluid overloaded patient, clinically resolved sodium at 137--140 today # Severe calorie and protein malnutrition, albumin 2.8 today . IV albumin,tube feeds #. Abnormal EKG with prolonged QTC, get magnesium level checked, hold QTC prolonging medications  Follow-up with palliative care    All the records are reviewed and case discussed with Care Management/Social Workerr. Management plans discussed with the patient, family and they are in agreement.  CODE STATUS: FC   TOTAL TIME TAKING CARE OF THIS PATIENT: 35  minutes.   POSSIBLE D/C IN ? DAYS, DEPENDING ON CLINICAL CONDITION.  Note: This dictation was prepared with Dragon dictation along with smaller phrase technology. Any transcriptional errors that result from this process are unintentional.   Ramonita Lab M.D on 06/16/2016 at 4:30 PM  Between 7am to 6pm - Pager - [phone] After 6pm go to [email or web address] - password [email or web address]  [facility]  Office  [phone]  CC: Primary care physician; Pcp Not In System

## 2016-06-16 NOTE — Progress Notes (Signed)
NG tube clogged. Multiple attempts by this writer and Maralyn Sago, RN to unclog with warm water using push-pull method w/o success.. Protocol for pancreatic enzymes initiated. Solution instilled, current soaking. Will advise oncoming RN.

## 2016-06-16 NOTE — Progress Notes (Signed)
Pharmacy Note  Pharmacy Consult for Electrolyte Monitoring Indication: hypophosphatemia  No Known Allergies  Patient Measurements: Height:  (170.2 cm) (estimated) Weight: 247 lb 2.2 oz (112.1 kg) IBW/kg (Calculated) : 66.1  Vital Signs: Temp: 97.6 F (36.4 C) (04/23 0800) Temp Source: Oral (04/23 0800) BP: 86/62 (04/23 1400) Pulse Rate: 89 (04/23 1400) Intake/Output from previous day: 04/22 0701 - 04/23 0700 In: 704.6 [I.V.:69.6; NG/GT:535; IV Piggyback:100] Out: 2050 [Urine:975; Stool:1075] Intake/Output from this shift: No intake/output data recorded.  Labs:  Recent Labs  06/14/16 0420 06/15/16 0438 06/16/16 0525  WBC 10.6 8.5 8.0  HGB 8.3* 7.3* 7.6*  HCT 24.7* 22.6* 23.1*  PLT 231 175 147*  CREATININE 1.59* 1.33* 1.12  MG 1.9 1.9 2.0  PHOS  --  2.4* 1.6*  ALBUMIN 2.8* 3.0* 2.7*  PROT 6.0* 5.7* 5.8*  AST ALT ALKPHOS 125 96 103  BILITOT 1.4* 1.6* 1.1  BILIDIR  --  0.4  --   IBILI  --  1.2*  --    Estimated Creatinine Clearance: 90.1 mL/min (by C-G formula based on SCr of 1.12 mg/dL).   Potassium (mmol/L)  Date Value  06/16/2016 3.8  04/03/2014 3.7   Calcium (mg/dL)  Date Value  40/98/1191 7.9 (L)   Calcium, Total (mg/dL)  Date Value  47/82/9562 7.7 (L)    Medical History: Past Medical History:  Diagnosis Date  . Anemia    iron deficient  . Anxiety   . Chicken pox   . Chronic pain   . Depression   . DM type 2 (diabetes mellitus, type 2) (HCC) 2006   Essentially resolved following bariatric surgery, no meds  . History of malnutrition   . History of sleep apnea   . History of vitamin A deficiency   . HTN (hypertension)    History of; resolved following bariatric surgery  . Sleep apnea    no CPAP after losing weight  . Venous stasis    Assessment: 56 y/o M with extensive past medical history including bariatric surgery with subsequent malabsorption and severe nutritional defeciency. Patient may be at risk for  refeeding syndrome.   Plan:  Kphos 30 mmol iv once and will f/u AM labs.   Luisa Hart D 06/16/2016,2:10 PM

## 2016-06-17 DIAGNOSIS — E43 Unspecified severe protein-calorie malnutrition: Secondary | ICD-10-CM

## 2016-06-17 LAB — GLUCOSE, CAPILLARY
GLUCOSE-CAPILLARY: 147 mg/dL — AB (ref 65–99)
GLUCOSE-CAPILLARY: 206 mg/dL — AB (ref 65–99)
GLUCOSE-CAPILLARY: 190 mg/dL — AB (ref 65–99)
Glucose-Capillary: 147 mg/dL — ABNORMAL HIGH (ref 65–99)
Glucose-Capillary: 150 mg/dL — ABNORMAL HIGH (ref 65–99)
Glucose-Capillary: 204 mg/dL — ABNORMAL HIGH (ref 65–99)
Glucose-Capillary: 228 mg/dL — ABNORMAL HIGH (ref 65–99)

## 2016-06-17 LAB — MAGNESIUM: MAGNESIUM: 2 mg/dL (ref 1.7–2.4)

## 2016-06-17 LAB — PHOSPHORUS: Phosphorus: 2.3 mg/dL — ABNORMAL LOW (ref 2.5–4.6)

## 2016-06-17 MED ORDER — DEXTROSE 5 % IV SOLN
10.0000 mmol | Freq: Once | INTRAVENOUS | Status: AC
  Administered 2016-06-17: 10 mmol via INTRAVENOUS
  Filled 2016-06-17: qty 3.33

## 2016-06-17 NOTE — Progress Notes (Signed)
Pharmacy Antibiotic Note  [name] is a 56 y.o. female admitted on 05/30/2016 with pneumonia.  Pharmacy has been consulted for Vancomycin and Cefepime dosing. Vancomycin d/c 4/22.  Plan: Will continue cefepime 2 g iv q 8 hours.   Height:  (170.2 cm) (estimated) Weight: 247 lb 2.2 oz (112.1 kg) IBW/kg (Calculated) : 66.1  Temp (24hrs), Avg:98 F (36.7 C), Min:97.6 F (36.4 C), Max:98.2 F (36.8 C)   Recent Labs Lab 06/14/2016 0606 06/14/16 0420 06/15/16 0438 06/16/16 0525 06/17/16 0531  WBC 17.1* 10.6 8.5 8.0 9.3  CREATININE 1.95* 1.59* 1.33* 1.12 0.91  LATICACIDVEN 1.6  --   --   --   --     Estimated Creatinine Clearance: 110.9 mL/min (by C-G formula based on SCr of 0.91 mg/dL).    No Known Allergies  Antimicrobials this admission: cefepime  4/20 >>   Vanc 4/20 >>  4/22  Dose adjustments this admission:    Microbiology results: C diff: negative BCx: NGTD UCx:    Sputum:    MRSA PCR: negative  Thank you for allowing pharmacy to be a part of this patient's care.  Luisa Hart D 06/17/2016 1:01 PM

## 2016-06-17 NOTE — Plan of Care (Signed)
Problem: SLP Dysphagia Goals Goal: Misc Dysphagia Goal Pt will safely tolerate po diet of least restrictive consistency w/ no overt s/s of aspiration noted by Staff/pt/family x3 sessions.    

## 2016-06-17 NOTE — Evaluation (Signed)
Clinical/Bedside Swallow Evaluation Patient Details  Name: Tenelle Andreason MRN: [id number] Date of Birth: [birth year]  Today's Date: 06/17/2016 Time: SLP Start Time (ACUTE ONLY): 1100 SLP Stop Time (ACUTE ONLY): 1200 SLP Time Calculation (min) (ACUTE ONLY): 60 min  Past Medical History:  Past Medical History:  Diagnosis Date  . Anemia    iron deficient  . Anxiety   . Chicken pox   . Chronic pain   . Depression   . DM type 2 (diabetes mellitus, type 2) (HCC) 2006   Essentially resolved following bariatric surgery, no meds  . History of malnutrition   . History of sleep apnea   . History of vitamin A deficiency   . HTN (hypertension)    History of; resolved following bariatric surgery  . Sleep apnea    no CPAP after losing weight  . Venous stasis    Past Surgical History:  Past Surgical History:  Procedure Laterality Date  . APPENDECTOMY  1991  . BELOW KNEE LEG AMPUTATION Right 2014  . CHOLECYSTECTOMY  1991  . Gastric sleeve, biliopancreatic diversion, duodenal switch  2006  . HERNIA REPAIR  2009  . severe malnutrition  2014  . TONSILLECTOMY AND ADENOIDECTOMY  1967   HPI:  Pt is a 56 y.o. female with a known history of Multiple medical problems including diabetes, diabetic neuropathy, iron deficiency anemia, status post gastric bypass, status post right BKA, peripheral vascular disease, recent admission for multiple rib fractures. UTI, weakness, comes back to the hospital with altered mental status, decreased blood pressure. Patient unfortunately is not able to provide much history due to significant encephalopathy. Apparently he was brought to due to altered mental status. He was noted to be hypotensive with systolic blood pressure as low as 89/49, his labs revealed acute renal failure with creatinine of 1.95, hyponatremia, markedly elevated ammonia level to 183, elevated white blood cell count is 17.1, his x-ray revealed bilateral pleural effusions, atelectasis versus infiltrates. Pt  has Severe calorie and protein malnutrition, albumin 2.8 today; Anasarca secondary to third spacing from severe malnutrition. Pt is eager to have something to drink. Pt currently has an NG in place for nutritional support.     Assessment / Plan / Recommendation Clinical Impression  Pt appears at min increased risk for aspiration secondary to his overall declined medical status, uncoordinated respiratory pattern, and NG in place currently. Pt appeared to adequately tolerate small, SINGLE sips of thin liquid via CUP w/ no immediate, overt s/s of aspiration noted; no decline in O2 sats or significant changes in RR/HR. Rest breaks were given b/t trials. However, when pt appeared to take multiple sips when drinking or when tilting his head back, pt exhibited a delayed cough. Trials of puree (applesauce) appeared to be adequately tolerated w/ no overt s/s of aspiration; no oral phase deficits(timely bolus management, swallow, clearing). Pt's overall oropharyngeal phase swallowing was impacted by pt's min uncoordinated breathing w/ what appeared to be intermittent breath holding(?); had to also manage taking sips from cup moving the NG tube up/away from mouth. This could increase risk for aspiration episodes to occur as well. Pt required feeding support; frequent Rest Breaks to allow pt to monitor breathing for task of intake of po trials. No solids were attempted d/t NG placement. MD consulted post evaluation and indicated pt needs to keep the NG another 1-2 days for nutritional support b/f having it removed. While NG is in place, SLP recommends only PLEASURE trials of thin liquids(WATER) via Cup post thorough oral  care, aspiration precautions and superivsion. ST services will f/u w/ toleration of trials to upgrade diet consistency as pt's oral diet can upgrade post NG removal. MD/NSG agreed. SLP Visit Diagnosis: Dysphagia, oropharyngeal phase (R13.12)    Aspiration Risk  Mild aspiration risk    Diet  Recommendation  NG TFs currently per MD; pleasure sips of thin liquids (WATER) post thorough oral care via CUP;  Aspiration precautions and supervision.   Medication Administration: Crushed with puree    Other  Recommendations Recommended Consults:  (Dietician f/u) Oral Care Recommendations: Oral care QID;Staff/trained caregiver to provide oral care   Follow up Recommendations Skilled Nursing facility      Frequency and Duration min 3x week  2 weeks       Prognosis Prognosis for Safe Diet Advancement: Fair Barriers to Reach Goals: Cognitive deficits;Severity of deficits      Swallow Study   General Date of Onset: 28-Jun-2016 HPI: Pt is a 56 y.o. female with a known history of Multiple medical problems including diabetes, diabetic neuropathy, iron deficiency anemia, status post gastric bypass, status post right BKA, peripheral vascular disease, recent admission for multiple rib fractures. UTI, weakness, comes back to the hospital with altered mental status, decreased blood pressure. Patient unfortunately is not able to provide much history due to significant encephalopathy. Apparently he was brought to due to altered mental status. He was noted to be hypotensive with systolic blood pressure as low as 89/49, his labs revealed acute renal failure with creatinine of 1.95, hyponatremia, markedly elevated ammonia level to 183, elevated white blood cell count is 17.1, his x-ray revealed bilateral pleural effusions, atelectasis versus infiltrates. Pt has Severe calorie and protein malnutrition, albumin 2.8 today; Anasarca secondary to third spacing from severe malnutrition. Pt is eager to have something to drink. Pt currently has an NG in place for nutritional support.   Type of Study: Bedside Swallow Evaluation Previous Swallow Assessment: none reported by pt Diet Prior to this Study: Regular;Thin liquids (prior to admission per pt report) Temperature Spikes Noted: No (wbc 9.3) Respiratory Status:  Nasal cannula (6 liters) History of Recent Intubation: No Behavior/Cognition: Alert;Cooperative;Pleasant mood;Confused;Distractible;Requires cueing Oral Cavity Assessment: Dry Oral Care Completed by SLP: Recent completion by staff Oral Cavity - Dentition: Adequate natural dentition Vision: Functional for self-feeding Self-Feeding Abilities: Needs assist;Needs set up;Total assist (weak UEs) Patient Positioning: Upright in bed (appeared somewhat kyphotic) Baseline Vocal Quality: Normal;Low vocal intensity Volitional Cough:  (Fair) Volitional Swallow: Able to elicit    Oral/Motor/Sensory Function Overall Oral Motor/Sensory Function: Within functional limits (grossly)   Ice Chips Ice chips: Within functional limits Presentation: Spoon (fed; 6 trials ) Other Comments: noted pt's uncoordinated breathing w/ what appeared to be intermittent breath holding(?)   Thin Liquid Thin Liquid: Impaired Presentation: Cup;Self Fed (assisted; 10 trials) Oral Phase Impairments:  (none) Oral Phase Functional Implications:  (none) Pharyngeal  Phase Impairments: Cough - Delayed (x1 trial when taking multiple sips;  x1 tilting head back ) Other Comments: noted pt's uncoordinated breathing w/ what appeared to be intermittent breath holding(?); had to also manage taking sips from cup moving the NG tube up/away from mouth    Nectar Thick Nectar Thick Liquid: Not tested   Honey Thick Honey Thick Liquid: Not tested   Puree Puree: Within functional limits Presentation: Spoon (fed; 6 trials) Other Comments: appeared to adequately tolerate   Solid   GO   Solid: Not tested Other Comments: NG in place         [place]  Claudette Laws, MS, CCC-SLP Dinari Stgermaine 06/17/2016,3:22 PM

## 2016-06-17 NOTE — Progress Notes (Signed)
[facility] - [place] at [facility]   PATIENT NAME: [name]    MR#:  [id number]  DATE OF BIRTH:  07-03-1960  SUBJECTIVE:  CHIEF COMPLAINT: Patient is more awake and alert  REVIEW OF SYSTEMS:  CONSTITUTIONAL: No fever, fatigue or weakness.  EYES: No blurred or double vision.  EARS, NOSE, AND THROAT: No tinnitus or ear pain.  RESPIRATORY: No cough, shortness of breath, wheezing or hemoptysis.  CARDIOVASCULAR: No chest pain, orthopnea, edema.  GASTROINTESTINAL: No nausea, vomiting, diarrhea or abdominal pain.  GENITOURINARY: No dysuria, hematuria.  ENDOCRINE: No polyuria, nocturia,  HEMATOLOGY: No anemia, easy bruising or bleeding SKIN: No rash or lesion. MUSCULOSKELETAL: No joint pain or arthritis.   NEUROLOGIC: Intermittent episodes of confusion. No tingling, numbness PSYCHIATRY: No anxiety or depression.   DRUG ALLERGIES:  No Known Allergies  VITALS:  Blood pressure 111/71, pulse 86, temperature 98.2 F (36.8 C), temperature source Axillary, resp. rate (!) 23, height  (1.702 m), weight 112.1 kg (247 lb 2.2 oz), SpO2 95 %.  PHYSICAL EXAMINATION:  GENERAL:  56 y.o.-year-old Caucasian  transgender patient  lying in the bed with no acute distress.  EYES: Pupils equal, round, reactive to light and accommodation. No scleral icterus. Extraocular muscles intact.  HEENT: Head atraumatic, normocephalic. Oropharynx and nasopharynx clear.  NECK:  Supple, no jugular venous distention. No thyroid enlargement, no tenderness.  LUNGS: Normal breath sounds bilaterally, no wheezing, rales,rhonchi or crepitation. No use of accessory muscles of respiration.  CARDIOVASCULAR: S1, S2 normal. No murmurs, rubs, or gallops.  ABDOMEN: Soft, nontender, distended. Ascites Bowel sounds present. No organomegaly or mass.  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  NEUROLOGIC: Alert but intermittent episodes of lethargy.  Sensation intact. Gait not checked.  PSYCHIATRIC: The patient is alert and intermittent episodes of lethargy but following commands.  SKIN: Perineal area is erythematous, pressure ulcers on the back, sacral area   LABORATORY PANEL:   CBC  Recent Labs Lab 06/17/16 0531  WBC 9.3  HGB 7.9*  HCT 23.9*  PLT 145*   ------------------------------------------------------------------------------------------------------------------  Chemistries   Recent Labs Lab 06/16/16 0525 06/17/16 0531  NA 140 143  K 3.8 4.2  CL 118* 118*  CO2 21* 20*  GLUCOSE 199* 155*  BUN 13 12  CREATININE 1.12 0.91  CALCIUM 7.9* 7.9*  MG 2.0 2.0  AST 19  --   ALT 18  --   ALKPHOS 103  --   BILITOT 1.1  --    ------------------------------------------------------------------------------------------------------------------  Cardiac Enzymes  Recent Labs Lab 06/14/16 0420  TROPONINI <0.03   ------------------------------------------------------------------------------------------------------------------  RADIOLOGY:  Dg Chest Port 1 View  Result Date: 06/16/2016 CLINICAL DATA:  Respiratory failure EXAM: PORTABLE CHEST 1 VIEW COMPARISON:  06/14/2016 FINDINGS: Cardiac shadow is stable. Right-sided PICC line and nasogastric catheter are again seen and stable. Patchy infiltrative changes are again seen bilaterally and stable. No new focal infiltrate is noted. No significant effusion is seen. No bony abnormality is noted. IMPRESSION: Stable patchy infiltrates bilaterally. Electronically Signed   By: Alcide Clever M.D.   On: 06/16/2016 08:04   Dg Abd Portable 1v  Result Date: 06/16/2016 CLINICAL DATA:  NG tube placement EXAM: PORTABLE ABDOMEN - 1 VIEW COMPARISON:  Abdominal radiograph 2016/06/15 FINDINGS: The tip and side port of the nasogastric tube overlies the distal stomach. The tip may be in the first portion of the duodenum. Multiple loops of gas distended bowel are present. IMPRESSION:  Nasogastric  tube tip near the gastroduodenal junction. Electronically Signed   By: Deatra Robinson M.D.   On: 06/16/2016 22:06    EKG:   Orders placed or performed during the hospital encounter of 06/23/2016  . EKG 12-Lead  . EKG 12-Lead    ASSESSMENT AND PLAN:   # Sepsis with hypotension secondary to  healthcare associated pneumonia, continue patient on broad-spectrum antibiotic therapy cefepime and vancomycin d/ced MRSA PCR is negative and blood cultures are negative so far, may need a paracentesis. Leukocytosis is improving. Wean off Levophed GTT , keep MAP greater than 60 # Acute hepatic encephalopathy, Clinically improving continue  lactulose and Xifaxan, NG feeds, speech therapy has recommended nectar thick liquids #. Anasarca secondary to third spacing from severe malnutrition #. Acute renal failure, ATN patient was given IV fluids in the emergency room; creatinine 1.95-1.59-1.Marland Kitchen3 --1.12--0.91 #. Hyponatremia in fluid overloaded patient, clinically resolved sodium at 137--140--143 today # Severe calorie and protein malnutrition, albumin 2.8 today . IV albumin,tube feeds #. Abnormal EKG with prolonged QTC, get magnesium level checked, hold QTC prolonging medications # sacral decubitus ulcer - wound care and reposition pt  Follow-up with palliative care  All the records are reviewed and case discussed with Care Management/Social Workerr. Management plans discussed with the patient, family and they are in agreement.  CODE STATUS: FC   TOTAL TIME TAKING CARE OF THIS PATIENT: 35  minutes.   POSSIBLE D/C IN ? DAYS, DEPENDING ON CLINICAL CONDITION.  Note: This dictation was prepared with Dragon dictation along with smaller phrase technology. Any transcriptional errors that result from this process are unintentional.   Ramonita Lab M.D on 06/17/2016 at 4:22 PM  Between 7am to 6pm - Pager - [phone] After 6pm go to [email or web address] - password [email or web address]  [facility]  Office   [phone]  CC: Primary care physician; Pcp Not In System

## 2016-06-17 NOTE — Progress Notes (Signed)
Pharmacy Note  Pharmacy Consult for Electrolyte Monitoring Indication: hypophosphatemia  No Known Allergies  Patient Measurements: Height:  (170.2 cm) (estimated) Weight: 247 lb 2.2 oz (112.1 kg) IBW/kg (Calculated) : 66.1  Vital Signs: Temp: 98.2 F (36.8 C) (04/24 0800) Temp Source: Axillary (04/24 0800) BP: 111/71 (04/24 1100) Pulse Rate: 86 (04/24 1100) Intake/Output from previous day: 04/23 0701 - 04/24 0700 In: 50 [IV Piggyback:50] Out: 425 [Urine:300; Stool:125] Intake/Output from this shift: Total I/O In: 1314 [I.V.:66.5; NG/GT:1247.5] Out: 350 [Urine:350]  Labs:  Recent Labs  06/15/16 0438 06/16/16 0525 06/17/16 0531  WBC 8.5 8.0 9.3  HGB 7.3* 7.6* 7.9*  HCT 22.6* 23.1* 23.9*  PLT 175 147* 145*  CREATININE 1.33* 1.12 0.91  MG 1.9 2.0 2.0  PHOS 2.4* 1.6* 2.3*  ALBUMIN 3.0* 2.7*  --   PROT 5.7* 5.8*  --   AST 21 19  --   ALT 18 18  --   ALKPHOS 96 103  --   BILITOT 1.6* 1.1  --   BILIDIR 0.4  --   --   IBILI 1.2*  --   --    Estimated Creatinine Clearance: 110.9 mL/min (by C-G formula based on SCr of 0.91 mg/dL).   Potassium (mmol/L)  Date Value  06/17/2016 4.2  04/03/2014 3.7   Calcium (mg/dL)  Date Value  16/11/9602 7.9 (L)   Calcium, Total (mg/dL)  Date Value  54/10/8117 7.7 (L)    Medical History: Past Medical History:  Diagnosis Date  . Anemia    iron deficient  . Anxiety   . Chicken pox   . Chronic pain   . Depression   . DM type 2 (diabetes mellitus, type 2) (HCC) 2006   Essentially resolved following bariatric surgery, no meds  . History of malnutrition   . History of sleep apnea   . History of vitamin A deficiency   . HTN (hypertension)    History of; resolved following bariatric surgery  . Sleep apnea    no CPAP after losing weight  . Venous stasis    Assessment: 56 y/o M with extensive past medical history including bariatric surgery with subsequent malabsorption and severe nutritional defeciency. Patient  may be at risk for refeeding syndrome.   Plan:  Kphos 10 mmol iv once and will f/u AM labs.   Luisa Hart D 06/17/2016,12:57 PM

## 2016-06-17 NOTE — Progress Notes (Signed)
Pt alert and will hold a conversation with you.  The patient does not seem to be oriented to anything other than himself as when asked questions he replies with random answers. Patient will sometimes complain of specific pain. He had complained of pain under his right arm where his PICC line was inserted with the tubing.  He said "the tubing was heavy and his arm hurt". I further elevated his arm on a pillow and gave him an icepack to place under his arm. No further complaints after this.  Patient does seem very uncomfortable when rolling for wound care or repositioning. He tried to refuse repositioning a few times, however we educated him on the importance of off-loading pressure and he then agreed.  He right arm triple lumen PICC works well, flushes easily, blood return all lumens. Flexi-seal intact and draining. Urinary catheter intact and draining an adequate amount of clear, yellow urine. Patient had stated earlier in shift that he wished to no longer put himself through all of this "pain and suffering".  He wished to speak to the doctor regarding his code status. Later, his family from out of state was in and they spoke with the doctor and the patient changed his mind again and decided he wanted to remain a full code. Patient remains with his tube feeding.  Currently Vital 1.2 running at 40cc/hr. Tolerating well.  Able to make needs known and currently resting quietly. Call bell in reach.

## 2016-06-17 NOTE — Care Management (Signed)
RNCM received request from MD for LTAC screen. Both [facility] and [facility] will review benefits and patient criteria. RNCM will continue to follow.

## 2016-06-17 NOTE — Progress Notes (Signed)
[facility] [place] Critical Care Medicine Progess Note    SYNOPSIS   [name]  is a 56 y.o. 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 Now with possible sepsis and hepatic encephalopathy.   ASSESSMENT/PLAN   Hepatic Encephalopathy with hyperammonemia -Continued confusion and somnolence, continue lactulose, rifaximin.    Severe malnutrition with hypoalbuminemia.  -Continue tube feeds.  --Discussed with speech, can take nectar thick liquids, as pt can take some PO, but unlikely to be able to take enough to sustain himself, especially given malabsorption from gastric bypass.   Septic Shock with hypotension.  -Improving, continue abx.  -Wean pressors as tolerated to SBP of 90.   AKI -Improved.   Multiple decub ulcers, poor wound healing due to malnutrition.  -Continue tube feeds, wound care following.    INTAKE / OUTPUT:  Intake/Output Summary (Last 24 hours) at 06/17/16 1411 Last data filed at 06/17/16 1000  Gross per 24 hour  Intake          1364.03 ml  Output              625 ml  Net           739.03 ml    Micro/culture results:  BCx2 4/20; negative.  UC -- Sputum.   Antibiotics: cefepime  4/20 >>   Vanc 4/20 >>  4/22  Best Practices  DVT Prophylaxis: enoxaparin.  GI Prophylaxis: famotidine.    Discussed patients poor prognosis with patient in detail, he had earlier expressed to RN that he was "tired" of doing everything and wanted to speak with the doctor. However on speaking with him at this time, and informing of likely outcomes including intubation, possible tracheostomy, long term rehab or NH stays and readmissions, he would like to continue to try everything.  Will therefore continue current measures and consider  tranfer to [facility] facility.   ---------------------------------------   ----------------------------------------   Name: [name] MRN: [id number] DOB: 03/13/1960    ADMISSION DATE:  07/08/16   SUBJECTIVE:   Pt currently on the lethargic can not provide history or review of systems.   Review of Systems:  --  VITAL SIGNS: Temp:  [97.6 F (36.4 C)-98.2 F (36.8 C)] 98.2 F (36.8 C) (04/24 0800) Pulse Rate:  [73-91] 86 (04/24 1100) Resp:  [11-30] 23 (04/24 1100) BP: (88-154)/(47-143) 111/71 (04/24 1100) SpO2:  [86 %-96 %] 95 % (04/24 1100)     PHYSICAL EXAMINATION: Physical Examination:   VS: BP 111/71   Pulse 86   Temp 98.2 F (36.8 C) (Axillary)   Resp (!) 23   Ht  (1.702 m) Comment: estimated  Wt 247 lb 2.2 oz (112.1 kg)   SpO2 95%   BMI 38.71 kg/m   General Appearance: No distress  Neuro:without focal findings, lethargic but arousable.  HEENT: PERRLA, EOM intact. Pulmonary: normal breath sounds   CardiovascularNormal S1,S2.  No m/r/g.   Abdomen: Benign, Soft, non-tender. Renal:  No costovertebral tenderness  GU:  Not performed at this time. Endocrine: No evident thyromegaly. Skin:   warm, no rashes, no ecchymosis  Extremities: normal, no cyanosis, clubbing.    LABORATORY PANEL:   CBC  Recent Labs Lab 06/17/16 0531  WBC 9.3  HGB 7.9*  HCT 23.9*  PLT 145*    Chemistries   Recent Labs Lab 06/16/16 0525  06/17/16 0531  NA 140 143  K 3.8 4.2  CL 118* 118*  CO2 21* 20*  GLUCOSE 199* 155*  BUN 13 12  CREATININE 1.12 0.91  CALCIUM 7.9* 7.9*  MG 2.0 2.0  PHOS 1.6* 2.3*  AST 19  --   ALT 18  --   ALKPHOS 103  --   BILITOT 1.1  --      Recent Labs Lab 06/16/16 1617 06/16/16 1938 06/16/16 2359 06/17/16 0359 06/17/16 0729 06/17/16 1130  GLUCAP 157* 127* 147* 150* 147* 206*   No results for input(s): PHART, PCO2ART, PO2ART in the last 168 hours.  Recent Labs Lab 06/14/16 0420 06/15/16 0438 06/16/16 0525  AST ALT ALKPHOS 125 96 103  BILITOT 1.4* 1.6* 1.1  ALBUMIN 2.8* 3.0* 2.7*    Cardiac Enzymes  Recent Labs Lab 06/14/16 0420  TROPONINI <0.03    RADIOLOGY:  Dg Chest Port 1 View  Result Date: 06/16/2016 CLINICAL DATA:  Respiratory failure EXAM: PORTABLE CHEST 1 VIEW COMPARISON:  06/14/2016 FINDINGS: Cardiac shadow is stable. Right-sided PICC line and nasogastric catheter are again seen and stable. Patchy infiltrative changes are again seen bilaterally and stable. No new focal infiltrate is noted. No significant effusion is seen. No bony abnormality is noted. IMPRESSION: Stable patchy infiltrates bilaterally. Electronically Signed   By: Alcide Clever M.D.   On: 06/16/2016 08:04   Dg Abd Portable 1v  Result Date: 06/16/2016 CLINICAL DATA:  NG tube placement EXAM: PORTABLE ABDOMEN - 1 VIEW COMPARISON:  Abdominal radiograph Jun 25, 2016 FINDINGS: The tip and side port of the nasogastric tube overlies the distal stomach. The tip may be in the first portion of the duodenum. Multiple loops of gas distended bowel are present. IMPRESSION: Nasogastric tube tip near the gastroduodenal junction. Electronically Signed   By: Deatra Robinson M.D.   On: 06/16/2016 22:06      --Wells Guiles, MD.  ICU Pager: [phone] [facility] Office Number: [phone]   06/17/2016   Critical Care Attestation.  I have personally obtained a history, examined the patient, evaluated laboratory and imaging results, formulated the assessment and plan and placed orders. The Patient requires high complexity decision making for assessment and support, frequent evaluation and titration of therapies, application of advanced monitoring technologies and extensive interpretation of multiple databases. The patient has critical illness that could lead imminently to failure of 1 or more organ systems and requires the highest level of physician preparedness to intervene.  Critical Care  Time devoted to patient care services described in this note is 32 minutes and is exclusive of time spent in procedures.

## 2016-06-18 ENCOUNTER — Inpatient Hospital Stay: Payer: 59

## 2016-06-18 DIAGNOSIS — E7220 Disorder of urea cycle metabolism, unspecified: Secondary | ICD-10-CM

## 2016-06-18 DIAGNOSIS — G9340 Encephalopathy, unspecified: Secondary | ICD-10-CM

## 2016-06-18 DIAGNOSIS — J9600 Acute respiratory failure, unspecified whether with hypoxia or hypercapnia: Secondary | ICD-10-CM

## 2016-06-18 DIAGNOSIS — K7290 Hepatic failure, unspecified without coma: Secondary | ICD-10-CM

## 2016-06-18 DIAGNOSIS — J9690 Respiratory failure, unspecified, unspecified whether with hypoxia or hypercapnia: Secondary | ICD-10-CM

## 2016-06-18 LAB — CBC
HCT: 28.7 % — ABNORMAL LOW (ref 35.0–47.0)
HCT: 26.8 % — ABNORMAL LOW (ref 35.0–47.0)
HCT: 22.6 % — ABNORMAL LOW (ref 35.0–47.0)
HCT: 23.9 % — ABNORMAL LOW (ref 35.0–47.0)
HCT: 24.4 % — ABNORMAL LOW (ref 35.0–47.0)
HEMATOCRIT: 31.6 % — AB (ref 35.0–47.0)
HEMATOCRIT: 31.4 % — AB (ref 35.0–47.0)
HEMATOCRIT: 24.7 % — AB (ref 35.0–47.0)
HEMATOCRIT: 23.1 % — AB (ref 35.0–47.0)
HEMOGLOBIN: 10.6 g/dL — AB (ref 12.0–16.0)
HEMOGLOBIN: 10.3 g/dL — AB (ref 12.0–16.0)
HEMOGLOBIN: 9.5 g/dL — AB (ref 12.0–16.0)
HEMOGLOBIN: 8.3 g/dL — AB (ref 12.0–16.0)
HEMOGLOBIN: 7.3 g/dL — AB (ref 12.0–16.0)
HEMOGLOBIN: 7.6 g/dL — AB (ref 12.0–16.0)
Hemoglobin: 8.9 g/dL — ABNORMAL LOW (ref 12.0–16.0)
Hemoglobin: 7.9 g/dL — ABNORMAL LOW (ref 12.0–16.0)
Hemoglobin: 7.9 g/dL — ABNORMAL LOW (ref 12.0–16.0)
MCH: 33.5 pg (ref 26.0–34.0)
MCH: 32.9 pg (ref 26.0–34.0)
MCH: 32.8 pg (ref 26.0–34.0)
MCH: 33.1 pg (ref 26.0–34.0)
MCH: 32.3 pg (ref 26.0–34.0)
MCH: 31.2 pg (ref 26.0–34.0)
MCH: 31.6 pg (ref 26.0–34.0)
MCH: 31.7 pg (ref 26.0–34.0)
MCH: 32 pg (ref 26.0–34.0)
MCHC: 33.6 g/dL (ref 32.0–36.0)
MCHC: 32.9 g/dL (ref 32.0–36.0)
MCHC: 33.1 g/dL (ref 32.0–36.0)
MCHC: 33.1 g/dL (ref 32.0–36.0)
MCHC: 33.4 g/dL (ref 32.0–36.0)
MCHC: 32.4 g/dL (ref 32.0–36.0)
MCHC: 32.7 g/dL (ref 32.0–36.0)
MCHC: 32.8 g/dL (ref 32.0–36.0)
MCHC: 32.5 g/dL (ref 32.0–36.0)
MCV: 99.7 fL (ref 80.0–100.0)
MCV: 100.1 fL — ABNORMAL HIGH (ref 80.0–100.0)
MCV: 99 fL (ref 80.0–100.0)
MCV: 100.1 fL — ABNORMAL HIGH (ref 80.0–100.0)
MCV: 96.8 fL (ref 80.0–100.0)
MCV: 96.3 fL (ref 80.0–100.0)
MCV: 96.8 fL (ref 80.0–100.0)
MCV: 96.7 fL (ref 80.0–100.0)
MCV: 98.6 fL (ref 80.0–100.0)
PLATELETS: 182 10*3/uL (ref 150–440)
PLATELETS: 203 10*3/uL (ref 150–440)
PLATELETS: 175 10*3/uL (ref 150–440)
PLATELETS: 145 10*3/uL — AB (ref 150–440)
PLATELETS: 154 10*3/uL (ref 150–440)
Platelets: 211 10*3/uL (ref 150–440)
Platelets: 195 10*3/uL (ref 150–440)
Platelets: 231 10*3/uL (ref 150–440)
Platelets: 147 10*3/uL — ABNORMAL LOW (ref 150–440)
RBC: 3.17 MIL/uL — ABNORMAL LOW (ref 3.80–5.20)
RBC: 3.14 MIL/uL — AB (ref 3.80–5.20)
RBC: 2.89 MIL/uL — AB (ref 3.80–5.20)
RBC: 2.68 MIL/uL — ABNORMAL LOW (ref 3.80–5.20)
RBC: 2.55 MIL/uL — AB (ref 3.80–5.20)
RBC: 2.34 MIL/uL — AB (ref 3.80–5.20)
RBC: 2.39 MIL/uL — AB (ref 3.80–5.20)
RBC: 2.48 MIL/uL — ABNORMAL LOW (ref 3.80–5.20)
RBC: 2.48 MIL/uL — ABNORMAL LOW (ref 3.80–5.20)
RDW: 14 % (ref 11.5–14.5)
RDW: 13.6 % (ref 11.5–14.5)
RDW: 13.4 % (ref 11.5–14.5)
RDW: 13.4 % (ref 11.5–14.5)
RDW: 14.2 % (ref 11.5–14.5)
RDW: 14.3 % (ref 11.5–14.5)
RDW: 14.7 % — ABNORMAL HIGH (ref 11.5–14.5)
RDW: 14.7 % — AB (ref 11.5–14.5)
RDW: 14.9 % — AB (ref 11.5–14.5)
WBC: 17.1 10*3/uL — AB (ref 3.6–11.0)
WBC: 10.1 10*3/uL (ref 3.6–11.0)
WBC: 8.3 10*3/uL (ref 3.6–11.0)
WBC: 9.1 10*3/uL (ref 3.6–11.0)
WBC: 10.6 10*3/uL (ref 3.6–11.0)
WBC: 8.5 10*3/uL (ref 3.6–11.0)
WBC: 8 10*3/uL (ref 3.6–11.0)
WBC: 9.3 10*3/uL (ref 3.6–11.0)
WBC: 12.9 10*3/uL — AB (ref 3.6–11.0)

## 2016-06-18 LAB — BLOOD GAS, ARTERIAL
Acid-base deficit: 7.6 mmol/L — ABNORMAL HIGH (ref 0.0–2.0)
BICARBONATE: 20 mmol/L (ref 20.0–28.0)
FIO2: 0.5
LHR: 15 {breaths}/min
O2 SAT: 92.5 %
PEEP/CPAP: 5 cmH2O
Patient temperature: 37
VT: 500 mL
pCO2 arterial: 50 mmHg — ABNORMAL HIGH (ref 32.0–48.0)
pH, Arterial: 7.21 — ABNORMAL LOW (ref 7.350–7.450)
pO2, Arterial: 79 mmHg — ABNORMAL LOW (ref 83.0–108.0)

## 2016-06-18 LAB — CBC WITH DIFFERENTIAL/PLATELET
BASOS ABS: 0.1 10*3/uL (ref 0–0.1)
Basophils Absolute: 0 10*3/uL (ref 0–0.1)
Basophils Relative: 0 %
Basophils Relative: 0 %
EOS PCT: 2 %
Eosinophils Absolute: 0 10*3/uL (ref 0–0.7)
Eosinophils Absolute: 0.3 10*3/uL (ref 0–0.7)
Eosinophils Relative: 0 %
HEMATOCRIT: 33.1 % — AB (ref 35.0–47.0)
HEMATOCRIT: 31.4 % — AB (ref 35.0–47.0)
HEMOGLOBIN: 11 g/dL — AB (ref 12.0–16.0)
Hemoglobin: 10.2 g/dL — ABNORMAL LOW (ref 12.0–16.0)
LYMPHS ABS: 0.4 10*3/uL — AB (ref 1.0–3.6)
LYMPHS ABS: 2.4 10*3/uL (ref 1.0–3.6)
LYMPHS PCT: 2 %
LYMPHS PCT: 14 %
MCH: 32.8 pg (ref 26.0–34.0)
MCH: 31.3 pg (ref 26.0–34.0)
MCHC: 33.2 g/dL (ref 32.0–36.0)
MCHC: 32.4 g/dL (ref 32.0–36.0)
MCV: 98.8 fL (ref 80.0–100.0)
MCV: 96.5 fL (ref 80.0–100.0)
MONO ABS: 0.7 10*3/uL (ref 0.2–0.9)
MONOS PCT: 5 %
MONOS PCT: 4 %
Monocytes Absolute: 1 10*3/uL — ABNORMAL HIGH (ref 0.2–0.9)
NEUTROS ABS: 13.6 10*3/uL — AB (ref 1.4–6.5)
NEUTROS PCT: 93 %
Neutro Abs: 17.8 10*3/uL — ABNORMAL HIGH (ref 1.4–6.5)
Neutrophils Relative %: 80 %
Platelets: 250 10*3/uL (ref 150–440)
Platelets: 317 10*3/uL (ref 150–440)
RBC: 3.35 MIL/uL — ABNORMAL LOW (ref 3.80–5.20)
RBC: 3.26 MIL/uL — ABNORMAL LOW (ref 3.80–5.20)
RDW: 13.9 % (ref 11.5–14.5)
RDW: 14.8 % — AB (ref 11.5–14.5)
WBC: 19.2 10*3/uL — ABNORMAL HIGH (ref 3.6–11.0)
WBC: 17.1 10*3/uL — ABNORMAL HIGH (ref 3.6–11.0)

## 2016-06-18 LAB — BASIC METABOLIC PANEL
ANION GAP: 7 (ref 5–15)
ANION GAP: 6 (ref 5–15)
ANION GAP: 2 — AB (ref 5–15)
ANION GAP: 5 (ref 5–15)
Anion gap: 4 — ABNORMAL LOW (ref 5–15)
Anion gap: 5 (ref 5–15)
Anion gap: 3 — ABNORMAL LOW (ref 5–15)
Anion gap: 5 (ref 5–15)
Anion gap: 3 — ABNORMAL LOW (ref 5–15)
BUN: 14 mg/dL (ref 6–20)
BUN: 12 mg/dL (ref 6–20)
BUN: 14 mg/dL (ref 6–20)
BUN: 14 mg/dL (ref 6–20)
BUN: 9 mg/dL (ref 6–20)
BUN: 10 mg/dL (ref 6–20)
BUN: 16 mg/dL (ref 6–20)
BUN: 12 mg/dL (ref 6–20)
BUN: 14 mg/dL (ref 6–20)
CALCIUM: 7 mg/dL — AB (ref 8.9–10.3)
CALCIUM: 7.9 mg/dL — AB (ref 8.9–10.3)
CALCIUM: 8.2 mg/dL — AB (ref 8.9–10.3)
CHLORIDE: 102 mmol/L (ref 101–111)
CHLORIDE: 112 mmol/L — AB (ref 101–111)
CHLORIDE: 111 mmol/L (ref 101–111)
CHLORIDE: 118 mmol/L — AB (ref 101–111)
CO2: 25 mmol/L (ref 22–32)
CO2: 24 mmol/L (ref 22–32)
CO2: 23 mmol/L (ref 22–32)
CO2: 22 mmol/L (ref 22–32)
CO2: 21 mmol/L — ABNORMAL LOW (ref 22–32)
CO2: 23 mmol/L (ref 22–32)
CO2: 20 mmol/L — AB (ref 22–32)
CO2: 20 mmol/L — AB (ref 22–32)
CO2: 21 mmol/L — AB (ref 22–32)
CREATININE: 0.78 mg/dL (ref 0.44–1.00)
CREATININE: 0.58 mg/dL (ref 0.44–1.00)
CREATININE: 1.33 mg/dL — AB (ref 0.44–1.00)
CREATININE: 1.01 mg/dL — AB (ref 0.44–1.00)
Calcium: 8 mg/dL — ABNORMAL LOW (ref 8.9–10.3)
Calcium: 7.5 mg/dL — ABNORMAL LOW (ref 8.9–10.3)
Calcium: 7.2 mg/dL — ABNORMAL LOW (ref 8.9–10.3)
Calcium: 6.8 mg/dL — ABNORMAL LOW (ref 8.9–10.3)
Calcium: 7.2 mg/dL — ABNORMAL LOW (ref 8.9–10.3)
Calcium: 7.7 mg/dL — ABNORMAL LOW (ref 8.9–10.3)
Chloride: 103 mmol/L (ref 101–111)
Chloride: 103 mmol/L (ref 101–111)
Chloride: 107 mmol/L (ref 101–111)
Chloride: 112 mmol/L — ABNORMAL HIGH (ref 101–111)
Chloride: 118 mmol/L — ABNORMAL HIGH (ref 101–111)
Creatinine, Ser: 0.89 mg/dL (ref 0.44–1.00)
Creatinine, Ser: 0.81 mg/dL (ref 0.44–1.00)
Creatinine, Ser: 0.88 mg/dL (ref 0.44–1.00)
Creatinine, Ser: 0.72 mg/dL (ref 0.44–1.00)
Creatinine, Ser: 0.91 mg/dL (ref 0.44–1.00)
GFR calc Af Amer: >60 mL/min (ref >60)
GFR calc Af Amer: >60 mL/min (ref >60)
GFR calc Af Amer: >60 mL/min (ref >60)
GFR calc Af Amer: >60 mL/min (ref >60)
GFR calc non Af Amer: >60 mL/min (ref >60)
GFR calc non Af Amer: >60 mL/min (ref >60)
GFR calc non Af Amer: >60 mL/min (ref >60)
GFR calc non Af Amer: 59 mL/min — ABNORMAL LOW (ref >60)
GFR calc non Af Amer: >60 mL/min (ref >60)
GFR calc non Af Amer: >60 mL/min (ref >60)
GLUCOSE: 146 mg/dL — AB (ref 65–99)
Glucose, Bld: 75 mg/dL (ref 65–99)
Glucose, Bld: 132 mg/dL — ABNORMAL HIGH (ref 65–99)
Glucose, Bld: 124 mg/dL — ABNORMAL HIGH (ref 65–99)
Glucose, Bld: 106 mg/dL — ABNORMAL HIGH (ref 65–99)
Glucose, Bld: 103 mg/dL — ABNORMAL HIGH (ref 65–99)
Glucose, Bld: 224 mg/dL — ABNORMAL HIGH (ref 65–99)
Glucose, Bld: 155 mg/dL — ABNORMAL HIGH (ref 65–99)
Glucose, Bld: 251 mg/dL — ABNORMAL HIGH (ref 65–99)
POTASSIUM: 3.7 mmol/L (ref 3.5–5.1)
POTASSIUM: 3.2 mmol/L — AB (ref 3.5–5.1)
POTASSIUM: 3 mmol/L — AB (ref 3.5–5.1)
Potassium: 3.4 mmol/L — ABNORMAL LOW (ref 3.5–5.1)
Potassium: 3.3 mmol/L — ABNORMAL LOW (ref 3.5–5.1)
Potassium: 3.8 mmol/L (ref 3.5–5.1)
Potassium: 3.4 mmol/L — ABNORMAL LOW (ref 3.5–5.1)
Potassium: 4.2 mmol/L (ref 3.5–5.1)
Potassium: 3.7 mmol/L (ref 3.5–5.1)
SODIUM: 133 mmol/L — AB (ref 135–145)
SODIUM: 129 mmol/L — AB (ref 135–145)
SODIUM: 136 mmol/L (ref 135–145)
SODIUM: 136 mmol/L (ref 135–145)
SODIUM: 142 mmol/L (ref 135–145)
Sodium: 135 mmol/L (ref 135–145)
Sodium: 134 mmol/L — ABNORMAL LOW (ref 135–145)
Sodium: 137 mmol/L (ref 135–145)
Sodium: 143 mmol/L (ref 135–145)

## 2016-06-18 LAB — COMPREHENSIVE METABOLIC PANEL
ALBUMIN: 1.8 g/dL — AB (ref 3.5–5.0)
ALBUMIN: 2.7 g/dL — AB (ref 3.5–5.0)
ALK PHOS: 103 U/L (ref 38–126)
ALT: 29 U/L (ref 14–54)
ALT: 22 U/L (ref 14–54)
ALT: 18 U/L (ref 14–54)
ANION GAP: 6 (ref 5–15)
ANION GAP: 1 — AB (ref 5–15)
AST: 35 U/L (ref 15–41)
AST: 28 U/L (ref 15–41)
AST: 19 U/L (ref 15–41)
Albumin: 2.8 g/dL — ABNORMAL LOW (ref 3.5–5.0)
Alkaline Phosphatase: 162 U/L — ABNORMAL HIGH (ref 38–126)
Alkaline Phosphatase: 125 U/L (ref 38–126)
Anion gap: 6 (ref 5–15)
BILIRUBIN TOTAL: 1.1 mg/dL (ref 0.3–1.2)
BUN: 22 mg/dL — AB (ref 6–20)
BUN: 21 mg/dL — ABNORMAL HIGH (ref 6–20)
BUN: 13 mg/dL (ref 6–20)
CHLORIDE: 104 mmol/L (ref 101–111)
CHLORIDE: 108 mmol/L (ref 101–111)
CHLORIDE: 118 mmol/L — AB (ref 101–111)
CO2: 22 mmol/L (ref 22–32)
CO2: 20 mmol/L — AB (ref 22–32)
CO2: 21 mmol/L — AB (ref 22–32)
CREATININE: 1.95 mg/dL — AB (ref 0.44–1.00)
CREATININE: 1.59 mg/dL — AB (ref 0.44–1.00)
Calcium: 7.5 mg/dL — ABNORMAL LOW (ref 8.9–10.3)
Calcium: 7.8 mg/dL — ABNORMAL LOW (ref 8.9–10.3)
Calcium: 7.9 mg/dL — ABNORMAL LOW (ref 8.9–10.3)
Creatinine, Ser: 1.12 mg/dL — ABNORMAL HIGH (ref 0.44–1.00)
GFR calc Af Amer: 43 mL/min — ABNORMAL LOW (ref >60)
GFR calc Af Amer: >60 mL/min (ref >60)
GFR calc non Af Amer: 37 mL/min — ABNORMAL LOW (ref >60)
GFR calc non Af Amer: 48 mL/min — ABNORMAL LOW (ref >60)
GFR calc non Af Amer: >60 mL/min (ref >60)
GFR, EST AFRICAN AMERICAN: 55 mL/min — AB (ref >60)
GLUCOSE: 79 mg/dL (ref 65–99)
Glucose, Bld: 139 mg/dL — ABNORMAL HIGH (ref 65–99)
Glucose, Bld: 199 mg/dL — ABNORMAL HIGH (ref 65–99)
POTASSIUM: 3.6 mmol/L (ref 3.5–5.1)
POTASSIUM: 3.8 mmol/L (ref 3.5–5.1)
Potassium: 3.1 mmol/L — ABNORMAL LOW (ref 3.5–5.1)
SODIUM: 134 mmol/L — AB (ref 135–145)
Sodium: 132 mmol/L — ABNORMAL LOW (ref 135–145)
Sodium: 140 mmol/L (ref 135–145)
Total Bilirubin: 1.2 mg/dL (ref 0.3–1.2)
Total Bilirubin: 1.4 mg/dL — ABNORMAL HIGH (ref 0.3–1.2)
Total Protein: 6.4 g/dL — ABNORMAL LOW (ref 6.5–8.1)
Total Protein: 6 g/dL — ABNORMAL LOW (ref 6.5–8.1)
Total Protein: 5.8 g/dL — ABNORMAL LOW (ref 6.5–8.1)

## 2016-06-18 LAB — HEPATIC FUNCTION PANEL
ALT: 18 U/L (ref 14–54)
AST: 21 U/L (ref 15–41)
Albumin: 3 g/dL — ABNORMAL LOW (ref 3.5–5.0)
Alkaline Phosphatase: 96 U/L (ref 38–126)
BILIRUBIN DIRECT: 0.4 mg/dL (ref 0.1–0.5)
BILIRUBIN INDIRECT: 1.2 mg/dL — AB (ref 0.3–0.9)
BILIRUBIN TOTAL: 1.6 mg/dL — AB (ref 0.3–1.2)
Total Protein: 5.7 g/dL — ABNORMAL LOW (ref 6.5–8.1)

## 2016-06-18 LAB — CK
Total CK: 828 U/L — ABNORMAL HIGH (ref 38–234)
Total CK: 347 U/L — ABNORMAL HIGH (ref 38–234)

## 2016-06-18 LAB — HEMOGLOBIN A1C
Hgb A1c MFr Bld: 4.4 % — ABNORMAL LOW (ref 4.8–5.6)
Mean Plasma Glucose: 80 mg/dL

## 2016-06-18 LAB — GLUCOSE, CAPILLARY
GLUCOSE-CAPILLARY: 209 mg/dL — AB (ref 65–99)
Glucose-Capillary: 228 mg/dL — ABNORMAL HIGH (ref 65–99)
Glucose-Capillary: 261 mg/dL — ABNORMAL HIGH (ref 65–99)
Glucose-Capillary: 257 mg/dL — ABNORMAL HIGH (ref 65–99)
Glucose-Capillary: 270 mg/dL — ABNORMAL HIGH (ref 65–99)

## 2016-06-18 LAB — PHOSPHORUS: Phosphorus: 2.5 mg/dL (ref 2.5–4.6)

## 2016-06-18 LAB — HIV ANTIBODY (ROUTINE TESTING W REFLEX): HIV SCREEN 4TH GENERATION: NONREACTIVE

## 2016-06-18 LAB — CULTURE, BLOOD (ROUTINE X 2)
CULTURE: NO GROWTH
CULTURE: NO GROWTH
SPECIAL REQUESTS: ADEQUATE
Special Requests: ADEQUATE

## 2016-06-18 LAB — AMMONIA: Ammonia: 45 umol/L — ABNORMAL HIGH (ref 9–35)

## 2016-06-18 LAB — VITAMIN B1: VITAMIN B1 (THIAMINE): 139.2 nmol/L (ref 66.5–200.0)

## 2016-06-18 LAB — POTASSIUM: Potassium: 2.7 mmol/L — CL (ref 3.5–5.1)

## 2016-06-18 LAB — TRIGLYCERIDES: TRIGLYCERIDES: 363 mg/dL — AB (ref <150)

## 2016-06-18 MED ORDER — SODIUM CHLORIDE 0.9 % IV SOLN
30.0000 meq | Freq: Once | INTRAVENOUS | Status: AC
  Administered 2016-06-18: 30 meq via INTRAVENOUS
  Filled 2016-06-18: qty 15

## 2016-06-18 MED ORDER — ALBUMIN HUMAN 25 % IV SOLN
12.5000 g | Freq: Once | INTRAVENOUS | Status: AC
  Administered 2016-06-18: 12.5 g via INTRAVENOUS
  Filled 2016-06-18: qty 50

## 2016-06-18 MED ORDER — FUROSEMIDE 10 MG/ML IJ SOLN
6.0000 mg/h | INTRAVENOUS | Status: DC
  Administered 2016-06-18: 6 mg/h via INTRAVENOUS
  Filled 2016-06-18: qty 25

## 2016-06-18 MED ORDER — PROPOFOL 1000 MG/100ML IV EMUL
5.0000 ug/kg/min | INTRAVENOUS | Status: DC
  Administered 2016-06-18: 22 ug/kg/min via INTRAVENOUS
  Administered 2016-06-18: 20 ug/kg/min via INTRAVENOUS
  Administered 2016-06-18: 22 ug/kg/min via INTRAVENOUS
  Administered 2016-06-18: 20 ug/kg/min via INTRAVENOUS
  Administered 2016-06-19: 22 ug/kg/min via INTRAVENOUS
  Filled 2016-06-18 (×5): qty 100

## 2016-06-18 MED ORDER — NOREPINEPHRINE 4 MG/250ML-% IV SOLN: 0.0000 ug/min | INTRAVENOUS | Status: DC

## 2016-06-18 MED ORDER — VECURONIUM BROMIDE 10 MG IV SOLR
10.0000 mg | Freq: Once | INTRAVENOUS | Status: AC
  Administered 2016-06-18: 10 mg via INTRAVENOUS

## 2016-06-18 MED ORDER — FENTANYL CITRATE (PF) 100 MCG/2ML IJ SOLN: 100.0000 ug | Freq: Once | INTRAMUSCULAR | Status: DC

## 2016-06-18 MED ORDER — VECURONIUM BROMIDE 10 MG IV SOLR
INTRAVENOUS | Status: AC
Start: 2016-06-18 — End: 2016-06-18
  Administered 2016-06-18: 10 mg via INTRAVENOUS
  Filled 2016-06-18: qty 10

## 2016-06-18 MED ORDER — IPRATROPIUM-ALBUTEROL 0.5-2.5 (3) MG/3ML IN SOLN
3.0000 mL | Freq: Four times a day (QID) | RESPIRATORY_TRACT | Status: DC
  Administered 2016-06-18 – 2016-06-21 (×13): 3 mL via RESPIRATORY_TRACT
  Filled 2016-06-18 (×12): qty 3

## 2016-06-18 MED ORDER — ALBUMIN HUMAN 25 % IV SOLN
12.5000 g | Freq: Three times a day (TID) | INTRAVENOUS | Status: AC
  Administered 2016-06-18 – 2016-06-20 (×6): 12.5 g via INTRAVENOUS
  Filled 2016-06-18 (×6): qty 50

## 2016-06-18 MED ORDER — PROPOFOL 1000 MG/100ML IV EMUL
INTRAVENOUS | Status: AC
  Administered 2016-06-18: 20 ug/kg/min via INTRAVENOUS
  Filled 2016-06-18: qty 100

## 2016-06-18 MED ORDER — DEXTROSE 5 % IV SOLN
0.0000 ug/min | INTRAVENOUS | Status: DC
  Administered 2016-06-18: 10 ug/min via INTRAVENOUS
  Administered 2016-06-18 – 2016-06-19 (×2): 12 ug/min via INTRAVENOUS
  Administered 2016-06-20: 9 ug/min via INTRAVENOUS
  Administered 2016-06-20: 10.987 ug/min via INTRAVENOUS
  Filled 2016-06-18 (×3): qty 16

## 2016-06-18 MED ORDER — VITAL AF 1.2 CAL PO LIQD
1000.0000 mL | ORAL | Status: DC
  Administered 2016-06-18: 1000 mL

## 2016-06-18 MED ORDER — PHENYLEPHRINE HCL 10 MG/ML IJ SOLN
0.0000 ug/min | INTRAMUSCULAR | Status: DC
  Filled 2016-06-18: qty 1

## 2016-06-18 MED ORDER — PRO-STAT SUGAR FREE PO LIQD
30.0000 mL | Freq: Three times a day (TID) | ORAL | Status: DC
  Administered 2016-06-18 – 2016-06-19 (×4): 30 mL via ORAL

## 2016-06-18 MED ORDER — POTASSIUM CHLORIDE 20 MEQ/15ML (10%) PO SOLN
40.0000 meq | Freq: Three times a day (TID) | ORAL | Status: AC
  Administered 2016-06-18 – 2016-06-19 (×3): 40 meq
  Filled 2016-06-18 (×3): qty 30

## 2016-06-18 MED ORDER — HYDROCORTISONE NA SUCCINATE PF 100 MG IJ SOLR
50.0000 mg | Freq: Two times a day (BID) | INTRAMUSCULAR | Status: AC
  Administered 2016-06-18 – 2016-06-19 (×3): 50 mg via INTRAVENOUS
  Filled 2016-06-18 (×3): qty 2

## 2016-06-18 MED ORDER — FUROSEMIDE 10 MG/ML IJ SOLN
40.0000 mg | Freq: Once | INTRAMUSCULAR | Status: AC
  Administered 2016-06-18: 40 mg via INTRAVENOUS

## 2016-06-18 MED ORDER — INSULIN ASPART 100 UNIT/ML ~~LOC~~ SOLN
0.0000 [IU] | SUBCUTANEOUS | Status: DC
  Administered 2016-06-18 – 2016-06-19 (×4): 5 [IU] via SUBCUTANEOUS
  Administered 2016-06-19: 3 [IU] via SUBCUTANEOUS
  Administered 2016-06-19: 5 [IU] via SUBCUTANEOUS
  Administered 2016-06-19: 7 [IU] via SUBCUTANEOUS
  Administered 2016-06-19: 3 [IU] via SUBCUTANEOUS
  Filled 2016-06-18: qty 3
  Filled 2016-06-18: qty 5
  Filled 2016-06-18: qty 3
  Filled 2016-06-18: qty 7
  Filled 2016-06-18 (×3): qty 5

## 2016-06-18 MED ORDER — FENTANYL CITRATE (PF) 100 MCG/2ML IJ SOLN
INTRAMUSCULAR | Status: AC
  Administered 2016-06-18: 08:00:00
  Filled 2016-06-18: qty 2

## 2016-06-18 MED ORDER — STERILE WATER FOR INJECTION IJ SOLN
INTRAMUSCULAR | Status: AC
  Administered 2016-06-18: 10 mL
  Filled 2016-06-18: qty 10

## 2016-06-18 MED ORDER — MIDAZOLAM HCL 2 MG/2ML IJ SOLN
INTRAMUSCULAR | Status: AC
  Administered 2016-06-18: 4 mg
  Filled 2016-06-18: qty 4

## 2016-06-18 MED ORDER — HYDROCORTISONE NA SUCCINATE PF 100 MG IJ SOLR
50.0000 mg | Freq: Every day | INTRAMUSCULAR | Status: AC
  Administered 2016-06-20 – 2016-06-21 (×2): 50 mg via INTRAVENOUS
  Filled 2016-06-18 (×2): qty 2

## 2016-06-18 MED ORDER — FUROSEMIDE 10 MG/ML IJ SOLN
INTRAMUSCULAR | Status: AC
  Administered 2016-06-18: 40 mg via INTRAVENOUS
  Filled 2016-06-18: qty 4

## 2016-06-18 NOTE — Progress Notes (Signed)
Daily Progress Note   Patient Name: [name]       Date: 06/18/2016 DOB: 14-Sep-1960  Age: 56 y.o. MRN#: [id number] Attending Physician: Merwyn Katos, MD Primary Care Physician: Pcp Not In System Admit Date: 07-09-2016  Reason for Consultation/Follow-up: Establishing goals of care  Subjective: Patient intubated and sedated this morning. No signs or symptoms of pain or discomfort.   Spoke with Raynelle Fanning, sister via telephone. Discussed diagnoses, interventions, and need for intubation this morning. Confirms that patient "had wanted everything done." She lives in [place] but will be in town tonight with other siblings. They plan to be at the hospital in early AM to discuss GOC.   Length of Stay: 5  Current Medications: Scheduled Meds:  . calamine  1 application Topical Q8H  . collagenase   Topical Daily  . enoxaparin (LOVENOX) injection  40 mg Subcutaneous Q24H  . feeding supplement (PRO-STAT SUGAR FREE 64)  30 mL Oral TID  . fentaNYL (SUBLIMAZE) injection  100 mcg Intravenous Once  . folic acid  1 mg Per Tube Daily  . hydrocortisone sod succinate (SOLU-CORTEF) inj  50 mg Intravenous Q12H   Followed by  . [START ON 06/20/2016] hydrocortisone sod succinate (SOLU-CORTEF) inj  50 mg Intravenous Daily  . insulin aspart  0-9 Units Subcutaneous Q4H  . ipratropium-albuterol  3 mL Nebulization Q6H  . lactulose  30 g Per Tube BID  . levothyroxine  100 mcg Intravenous Daily  . mouth rinse  15 mL Mouth Rinse BID  . multivitamin  15 mL Per Tube Daily  . pantoprazole (PROTONIX) IV  40 mg Intravenous Q24H  . rifaximin  550 mg Per Tube BID  . sodium chloride flush  10-40 mL Intracatheter Q12H  . Vitamin D (Ergocalciferol)  50,000 Units Per Tube Weekly    Continuous Infusions: . albumin human     . ceFEPIme (MAXIPIME) 2 GM IVP Stopped (06/18/16 0540)  . feeding supplement (VITAL AF 1.2 CAL) 1,000 mL (06/18/16 1154)  . furosemide (LASIX) infusion    . phenylephrine (NEO-SYNEPHRINE) Adult infusion    . propofol (DIPRIVAN) infusion 20 mcg/kg/min (06/18/16 0820)    PRN Meds: HYDROmorphone (DILAUDID) injection, ipratropium-albuterol, [DISCONTINUED] ondansetron **OR** ondansetron (ZOFRAN) IV, sodium chloride flush  Physical Exam  Constitutional: He appears ill. He is sedated and intubated.  HENT:  Head: Normocephalic and atraumatic.  Cardiovascular: Regular rhythm.   Pulmonary/Chest: He is intubated. He has decreased breath sounds. He has rhonchi.  Abdominal: Normal appearance. Bowel sounds are decreased.  Feet:  Right Foot: amputated Skin: Skin is warm and dry. There is pallor.  Psychiatric: Cognition and memory are impaired. He is inattentive.  Nursing note and vitals reviewed.          Vital Signs: BP (!) 86/56   Pulse 74   Temp 98 F (36.7 C) (Axillary)   Resp 15   Ht  (1.702 m) Comment: estimated  Wt 111.4 kg (245 lb 9.5 oz) Comment: with 6 pillows  SpO2 96%   BMI 38.47 kg/m  SpO2: SpO2: 96 % O2 Device: O2 Device: Bi-PAP O2 Flow Rate: O2 Flow Rate (L/min): 12 L/min  Intake/output summary:   Intake/Output Summary (Last 24 hours) at 06/18/16 1318 Last data filed at 06/18/16 0700  Gross per 24 hour  Intake          1639.27 ml  Output             1410 ml  Net           229.27 ml   LBM: Last BM Date: 06/18/16 Baseline Weight: Weight: 105.7 kg (233 lb) Most recent weight: Weight: 111.4 kg (245 lb 9.5 oz) (with 6 pillows)       Palliative Assessment/Data: PPS 20%   Flowsheet Rows     Most Recent Value  Intake Tab  Referral Department  Hospitalist  Unit at Time of Referral  ER  Palliative Care Primary Diagnosis  Other (Comment) [vascular]  Date Notified  06/30/16  Palliative Care Type  New Palliative care  Reason for referral  Clarify Goals of Care   Date of Admission  Jun 30, 2016  Date first seen by Palliative Care  2016/06/30  # of days IP prior to Palliative referral  0  Clinical Assessment  Palliative Performance Scale Score  20%  Psychosocial & Spiritual Assessment  Palliative Care Outcomes  Patient/Family meeting held?  Yes  Who was at the meeting?  patient, sister Raynelle Fanning) via telephone  Palliative Care Outcomes  Provided psychosocial or spiritual support, Clarified goals of care, ACP counseling assistance      Patient Active Problem List   Diagnosis Date Noted  . Acute encephalopathy   . Anasarca 06/30/2016  . Acute hepatic encephalopathy 06-30-2016  . Acute renal failure (ARF) (HCC) 06-30-16  . Severe protein-calorie malnutrition Lily Kocher: less than 60% of standard weight) (HCC) 06-30-2016  . Hyponatremia 06/30/2016  . Leukocytosis Jun 30, 2016  . Ascites 06/30/16  . Hepatic encephalopathy (HCC) 06-30-2016  . Healthcare-associated pneumonia   . Palliative care by specialist   . Goals of care, counseling/discussion   . Protein-calorie malnutrition, severe 05/29/2016  . Pressure injury of skin 05/28/2016  . Rib fractures 05/27/2016  . Chronic edema   . Finger fracture, right   . Benign essential HTN   . Chronic pain syndrome   . Venous stasis ulcer (HCC)   . Cellulitis 04/28/2015  . Anemia 04/28/2015  . Weakness generalized 04/28/2015  . Hypokalemia 04/28/2015  . Pressure ulcer of buttock, unstageable (HCC) 04/28/2015  . Scrotal swelling 04/28/2015  . Fall 04/28/2015  . Protein-calorie malnutrition (HCC) 04/28/2015  . Weakness 04/28/2015  . Cellulitis, scrotum   . Failure to thrive in adult   . Fungal dermatitis   . Generalized weakness   . Muscular deconditioning   . Scrotal edema   . Swelling of  scrotum   . Hypothyroidism 01/17/2015  . Anemia, iron deficiency 01/17/2015  . Vitamin D deficiency 01/17/2015  . S/P bariatric surgery 01/17/2015  . DM type 2 (diabetes mellitus, type 2) (HCC) 01/17/2015  .  Venous stasis ulcer of left lower extremity (HCC) 12/14/2013  . Chronic pain 07/10/2013  . 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 (HCC) 07/09/2013    Palliative Care Assessment & Plan   Patient Profile: 56 y.o. female  with past medical history of venous stasis, right BKA, diabetes, depression, anxiety, anemia, chronic pain, sleep apnea, malnutrition, s/p gastric bypass, combined reduction mammaplasty with abdominoplasty (2009), and uncompleted gender reassignment surgery in [place]    admitted on 06/02/2016 with altered mental status and low blood pressure. Recent hospital admission s/p fall from wheelchair and treated for UTI, weakness, and multiple rib fractures. In ED, patient was lethargic, hypotensive, with ammonia level of 183. Also acute renal failure. Chest xray revealed bilateral pleural effusions, atelectasis vs. infiltrates. CT head negative. US abdomen reveals no ascites. Patient with acute hepatic encephalopathy. Palliative medicine consultation for goals of care.   Assessment: Septic shock with hypotension Healthcare associated pneumonia Hepatic Encephalopathy Hyperammonemia Severe malnutrition with hypoalbuminemia Anasarca secondary to severe malnutrition Acute kidney injury Multiple non-healing decubitus ulcers  Recommendations/Plan:  FULL code. Continue current interventions.   Family will be arriving in town this evening. Plan to meet with them in AM to discuss GOC.  Goals of Care and Additional Recommendations:  Limitations on Scope of Treatment: Full Scope Treatment  Code Status:   FULL   Code Status Orders        Start     Ordered   06/03/2016 0937  Full code  Continuous     06/08/2016 0937    Code Status History    Date Active Date Inactive Code Status Order ID Comments User Context   05/27/2016  8:38 PM 06/02/2016  7:52 PM Full Code [id number]  Enid Baas, MD Inpatient   04/28/2015  4:32 AM 05/02/2015 12:29 AM Full Code [id number]  Rodolph Bong,  MD Inpatient   03/07/2014  9:05 PM 03/27/2014 10:36 PM Full Code [id number]  Barth Kirks Inpatient       Prognosis:   Unable to determine  Discharge Planning:  To Be Determined  Care plan was discussed with patient, RN, Dr. Nicholos Johns, and family  Thank you for allowing the Palliative Medicine Team to assist in the care of this patient.   Time In: 0945, 1300 Time Out: 1005, 1315 Total Time Prolonged Time Billed  no      Greater than 50%  of this time was spent counseling and coordinating care related to the above assessment and plan.  Vennie Homans, FNP-C Palliative Medicine Team  Phone: [phone] Fax: [phone]  Please contact Palliative Medicine Team phone at [phone] for questions and concerns.

## 2016-06-18 NOTE — Progress Notes (Signed)
PT Cancellation Note  Patient Details Name: [name] MRN: [id number] DOB: Jul 01, 1960   Cancelled Treatment:    Reason Eval/Treat Not Completed: Patient not medically ready. Pt with change in status this date. Pt is now intubated secondary to worsening ARF. BP low at 86/56. Due to change in status, will cancel current PT orders at this time. Please re-order when medically appropriate.   Amri Lien 06/18/2016, 1:20 PM  Elizabeth Palau, PT, DPT [phone]

## 2016-06-18 NOTE — Progress Notes (Signed)
1200 B/P noted to remain low consistently. Loma Messing NP. Orders received.

## 2016-06-18 NOTE — Progress Notes (Signed)
Busy day. Started on Levophed and Lasix drips around 1230. Stable now.

## 2016-06-18 NOTE — Progress Notes (Signed)
Pharmacy Antibiotic Note  [name] is a 56 y.o. female admitted on Jun 25, 2016 with pneumonia.  Pharmacy has been consulted for Vancomycin and Cefepime dosing. Vancomycin d/c 4/22. Day 6 Cefepime Plan: Will continue cefepime 2 g iv q 8 hours and f/u planned duration of therapy.   Height:  (170.2 cm) (estimated) Weight: 245 lb 9.5 oz (111.4 kg) (with 6 pillows) IBW/kg (Calculated) : 66.1  Temp (24hrs), Avg:98.2 F (36.8 C), Min:97.6 F (36.4 C), Max:98.9 F (37.2 C)   Recent Labs Lab 06-25-2016 0606 06/14/16 0420 06/15/16 0438 06/16/16 0525 06/17/16 0531 06/18/16 0500  WBC 17.1* 10.6 8.5 8.0 9.3 12.9*  CREATININE 1.95* 1.59* 1.33* 1.12 0.91 1.01  LATICACIDVEN 1.6  --   --   --   --   --     Estimated Creatinine Clearance: 99.6 mL/min (by C-G formula based on SCr of 1.01 mg/dL).    No Known Allergies  Antimicrobials this admission: cefepime  4/20 >>   Vanc 4/20 >>  4/22  Dose adjustments this admission:    Microbiology results: C diff: negative BCx: NG UCx:    Sputum:    MRSA PCR: negative  Thank you for allowing pharmacy to be a part of this patient's care.  Luisa Hart D 06/18/2016 11:31 AM

## 2016-06-18 NOTE — Progress Notes (Signed)
Pharmacy Note  Pharmacy Consult for Electrolyte Monitoring Indication: hypophosphatemia  No Known Allergies  Patient Measurements: Height:  (170.2 cm) (estimated) Weight: 245 lb 9.5 oz (111.4 kg) (with 6 pillows) IBW/kg (Calculated) : 66.1  Vital Signs: Temp: 98 F (36.7 C) (04/25 0200) Temp Source: Axillary (04/25 0200) BP: 86/56 (04/25 1000) Pulse Rate: 74 (04/25 1000) Intake/Output from previous day: 04/24 0701 - 04/25 0700 In: 2953.3 [I.V.:94.1; BM/WU:1324.4; IV Piggyback:400] Out: 1760 [Urine:1475; Stool:285] Intake/Output from this shift: No intake/output data recorded.  Labs:  Recent Labs  06/16/16 0525 06/17/16 0531 06/18/16 0500  WBC 8.0 9.3 12.9*  HGB 7.6* 7.9* 7.9*  HCT 23.1* 23.9* 24.4*  PLT 147* 145* 154  CREATININE 1.12 0.91 1.01  MG 2.0 2.0  --   PHOS 1.6* 2.3* 2.5  ALBUMIN 2.7*  --   --   PROT 5.8*  --   --   AST 19  --   --   ALT 18  --   --   ALKPHOS 103  --   --   BILITOT 1.1  --   --    Estimated Creatinine Clearance: 99.6 mL/min (by C-G formula based on SCr of 1.01 mg/dL).   Potassium (mmol/L)  Date Value  06/18/2016 3.7  04/03/2014 3.7   Calcium (mg/dL)  Date Value  02/26/7251 8.2 (L)   Calcium, Total (mg/dL)  Date Value  66/44/0347 7.7 (L)    Medical History: Past Medical History:  Diagnosis Date  . Anemia    iron deficient  . Anxiety   . Chicken pox   . Chronic pain   . Depression   . DM type 2 (diabetes mellitus, type 2) (HCC) 2006   Essentially resolved following bariatric surgery, no meds  . History of malnutrition   . History of sleep apnea   . History of vitamin A deficiency   . HTN (hypertension)    History of; resolved following bariatric surgery  . Sleep apnea    no CPAP after losing weight  . Venous stasis    Assessment: 56 y/o M with extensive past medical history including bariatric surgery with subsequent malabsorption and severe nutritional defeciency. Patient may be at risk for refeeding  syndrome.   Plan:  Electrolytes remain WNL. Will recheck in AM due to furosemide administration and poor nutritional status.   Luisa Hart D 06/18/2016,11:33 AM

## 2016-06-18 NOTE — Care Management (Signed)
RNCM received call from sister [place] requesting RNCM to meet with family in the morning after 0800 vent wean attempt which will be arranged.

## 2016-06-18 NOTE — Progress Notes (Signed)
Inpatient Diabetes Program Recommendations  AACE/ADA: New Consensus Statement on Inpatient Glycemic Control (2015)  Target Ranges:  Prepandial:   less than 140 mg/dL      Peak postprandial:   less than 180 mg/dL (1-2 hours)      Critically ill patients:  140 - 180 mg/dL  Results for [name], HUNKE (MRN [id number]) as of 06/18/2016 11:03  Ref. Range 06/17/2016 07:29 06/17/2016 11:30 06/17/2016 16:28 06/17/2016 19:45 06/17/2016 23:36 06/18/2016 03:52 06/18/2016 07:56  Glucose-Capillary Latest Ref Range: 65 - 99 mg/dL 409 (H) 811 (H) 914 (H) 204 (H) 228 (H) 228 (H) 209 (H)    Review of Glycemic Control  Diabetes history: DM2 hx (no meds needed after bariatric surgery)  Outpatient Diabetes medications: None Current orders for Inpatient glycemic control: Novolog 0-9 units Q4H (just ordered)  Inpatient Diabetes Program Recommendations: Correction (SSI): Please consider ordering ICU Glycemic control order set as patient is critically ill, ordered steroids, and glucose is elevated.  Thanks, Orlando Penner, RN, MSN, CDE Diabetes Coordinator Inpatient Diabetes Program [phone] (Team Pager from 8am to 5pm)

## 2016-06-18 NOTE — Progress Notes (Signed)
PULMONARY / CRITICAL CARE MEDICINE   Name: Jahnavi Muratore MRN: [id number] DOB: Jul 04, 1960    ADMISSION DATE:  06/19/2016  HISTORY OF PRESENT ILLNESS:   KevinWeltyis a 56 y.o.transgender [facility] female with sepsis and hepatic encephalopathy. Pt required mechanical intubation 04/25 due to worsening acute respiratory failure despite continue Bipap overnight secondary to pulmonary edema and HCAP.    REVIEW OF SYSTEMS:   Unable to assess pt intubated   SUBJECTIVE:  Pt currently intubated  VITAL SIGNS: BP (!) 86/56   Pulse 74   Temp 98 F (36.7 C) (Axillary)   Resp 15   Ht  (1.702 m) Comment: estimated  Wt 111.4 kg (245 lb 9.5 oz) Comment: with 6 pillows  SpO2 97%   BMI 38.47 kg/m   HEMODYNAMICS:    VENTILATOR SETTINGS: Vent Mode: PRVC FiO2 (%):  [50 %] 50 % Set Rate:  [15 bmp] 15 bmp Vt Set:  [500 mL] 500 mL PEEP:  [5 cmH20] 5 cmH20  INTAKE / OUTPUT: I/O last 3 completed shifts: In: 3003.3 [I.V.:94.1; Other:45; WU/JW:1191.4; IV Piggyback:450] Out: 1760 [Urine:1475; Stool:285]  PHYSICAL EXAMINATION: General: acutely ill transgender female to female, mechanically intubated  Neuro: sedated, not following commands, withdraws from painful stimulation, PERRL HEENT: mild kyphosis, supple, no JVD Cardiovascular: nsr, s1s2, rrr, no M/R/G Lungs: rhonchi and diminished throughout, even, non labored mechanically intubated  Abdomen: hypoactive BS x4, mildly distended and taut Musculoskeletal: right BKA, 1+ left lower extremity edema Skin: unstageable pressure ulcer left flank, unstageable pressure ulcer left ischial tuberosity, MASD bilateral buttocks    LABS: Reviewed   Imaging Dg Chest 1 View  Result Date: 06/18/2016 CLINICAL DATA:  56 year old female with a history of respiratory failure. Intubation. EXAM: CHEST 1 VIEW COMPARISON:  06/18/2016 FINDINGS: Same-day plain film demonstrates placement of endotracheal tube, which terminates in the distal trachea approximately 1.5  cm above the carina. Unchanged right upper extremity PICC, appears to terminate in the superior vena cava. Unchanged gastric tube which terminates out of the field of view. Similar appearance of low lung volumes with bilateral interstitial and airspace opacities. Unchanged appearance of the cardiomediastinal silhouette. IMPRESSION: Interval placement of endotracheal tube, which terminates distal trachea approximately 1.5 cm above the carina. These results were called by telephone at the time of interpretation on 06/18/2016 at 8:46 am to the nurse Ms [facility], who verbally acknowledged these results. Unchanged right upper extremity PICC, appearing to terminate superior vena cava. Unchanged gastric tube. Similar appearance of extensive bilateral interstitial and airspace disease. Electronically Signed   By: Gilmer Mor D.O.   On: 06/18/2016 08:49   Dg Chest Port 1 View  Result Date: 06/18/2016 CLINICAL DATA:  Respiratory failure. EXAM: PORTABLE CHEST 1 VIEW COMPARISON:  06/16/2016 FINDINGS: Right PICC line and NG tube in stable position. Heart size stable. Bilateral pulmonary infiltrates/edema again noted. No significant change. Low lung volumes. No prominent pleural effusion. No pneumothorax. IMPRESSION: 1. Lines and tubes in stable position. 2. Persistent bilateral pulmonary infiltrates/ edema without significant change. Low lung volumes. Electronically Signed   By: Maisie Fus  Register   On: 06/18/2016 07:16   STUDIES:  CT Head 04/20>>No acute intracranial abnormalities. The appearance of the brain is normal. US Abdomen Limited 04/20>>No evidence of ascites Echo 04/20>>Ef 60% to 65%  CULTURES: Blood x2 04/20>>negative  Cdiff 04/20>>negative   ANTIBIOTICS: Cefepime 04/20>> Vancomycin 04/20  SIGNIFICANT EVENTS: 04/20-Pt admitted to ICU  04/25-Pt mechanically intubated due to worsening acute respiratory failure despite continuous Bipap   LINES/TUBES: PICC  Triple Lumen 04/20>>  ASSESSMENT /  PLAN:  PULMONARY A: Acute respiratory failure secondary to pulmonary edema and HCAP Mechanical Intubation Hx: OSA P:   Full vent support for now wean as tolerated  Continue bronchodilator therapy  Continue IV steroids wean as tolerated  IV lasix x1 dose  VAP bundle  CXR in am Intermittent ABG's  CARDIOVASCULAR A:  Hypotension secondary to septic shock  Hx: HTN and Venous stasis  P:  Continuous telemetry monitoring  Hold outpatient amlodipine  RENAL A:   Acute renal failure with oliguria  P:   Trend BMP IV albumin x1 dose  Obtain bladder scan Replace electrolytes as indicated Monitor UOP  GASTROINTESTINAL A:   Severe malnutrition with hypoalbuminemia  P:   Continue tube feeds Protonix for PUD prophylaxis   HEMATOLOGIC A:   Anemia without acute blood loss  P:  Trend CBC Lovenox for VTE prophylaxis  Monitor for s/sx of bleeding Transfuse for hgb <7  INFECTIOUS A:   HCAP  Leukocytosis  P:   Trend WBC's and monitor fever curve  Continue abx as listed above  ENDOCRINE A:   Type II DM  P:   SSI  CBG's q4hrs  NEUROLOGIC A:   Hepatic encephalopathy secondary to hyperammonemia  Hx: Depression and anxiety P:   RASS goal: 0 to -1 Propofol gtt to maintain RASS goal  Prn dilaudid for pain management  Continue lactulose and rifaximin  Intermittent ammonia levels WUA daily   FAMILY  - Updates: No family at bedside to update at this time 06/18/2016  - Inter-disciplinary family meet or Palliative Care Consult pending 06/18/2016  Sonda Rumble, Juel Burrow  Pulmonary/Critical Care Pager [phone] (please enter 7 digits) PCCM Consult Pager [phone] (please enter 7 digits)

## 2016-06-18 NOTE — Progress Notes (Addendum)
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 Pt has also reported to writer that his MD has told him he only absorbs 10% of the fat he consumes post surgery. Pt currently with very watery diarrhea (noted pt also on lactulose). Pt is severely malnourished on admission, discussed during ICU rounds that pt may require initiation of TPN. Pt has PICC line. No plans to initiate TPN at this time  NUTRITION DIAGNOSIS:   Malnutrition related to chronic illness as evidenced by severe depletion of body fat, severe depletion of muscle mass, severe fluid accumulation.  Continues but being addressed via TF  GOAL:   Patient will meet greater than or equal to 90% of their needs  Progressing  MONITOR:   PO intake, Supplement acceptance, Labs, Weight trends  REASON FOR ASSESSMENT:   Consult Assessment of nutrition requirement/status  ASSESSMENT:   [name]  is a 56 y.o. female with a known history of Multiple medical problems including diabetes, diabetic neuropathy, iron deficiency anemia, status post gastric bypass, status post right BKA, peripheral vascular disease, recent admission for multiple rib fractures.  Pt with decline in respiratory status requiring intubation this AM Patient is currently intubated on ventilator support MV: 7.2 L/min Temp (24hrs), Avg:98.2 F (36.8 C), Min:97.6 F (36.4 C), Max:98.9 F (37.2 C)  Vital AF 1.2 at rate of 20 ml/hr this AM (previously tolerating at 70 ml/hr, turned down post intubation) Very watery stool via rectal tube, 285 mL documented Diprivan: 13.2 ml/hr (349 kcals) Labs: CBGs 147-228 Meds: lactulose, MVI, xifaxan, folic  acid, vitamin D  Diet Order:   NPO  Skin:  Wound (see comment) (ulceration on leg, stage III back post fall)  Last BM:  06/18/16  Height:   Ht Readings from Last 1 Encounters:  2016/06/30  (1.702 m)    Weight:   Wt Readings from Last 1 Encounters:  06/18/16 245 lb 9.5 oz (111.4 kg)    Ideal Body Weight:  67.2 kg  BMI:  Body mass index is 38.47 kg/m.  Estimated Nutritional Needs:   Kcal:  1225-1560 kcals (11-14 kcals/kg per ASPEN), 1892 kcals Modified [employer]  Protein:  >/= 135 g  Fluid:  >/= 2 L  EDUCATION NEEDS:   No education needs identified at this time  Romelle Starcher MS, RD, LDN [phone] Pager  [phone] Weekend/On-Call Pager

## 2016-06-18 NOTE — Progress Notes (Signed)
[facility] [place] at [facility]   PATIENT NAME: [name]    MR#:  [id number]  DATE OF BIRTH:  March 25, 1960  SUBJECTIVE:  CHIEF COMPLAINT: Patient is hypotensive today and intubated for respiratory distress today on April 25  REVIEW OF SYSTEMS:  CONSTITUTIONAL: No fever, fatigue or weakness.  EYES: No blurred or double vision.  EARS, NOSE, AND THROAT: No tinnitus or ear pain.  RESPIRATORY: No cough, shortness of breath, wheezing or hemoptysis.  CARDIOVASCULAR: No chest pain, orthopnea, edema.  GASTROINTESTINAL: No nausea, vomiting, diarrhea or abdominal pain.  GENITOURINARY: No dysuria, hematuria.  ENDOCRINE: No polyuria, nocturia,  HEMATOLOGY: No anemia, easy bruising or bleeding SKIN: Multiple nonhealing decub  ulcers  MUSCULOSKELETAL: No joint pain or arthritis.   NEUROLOGIC: Intermittent episodes of confusion. No tingling, numbness PSYCHIATRY: No anxiety or depression.   DRUG ALLERGIES:  No Known Allergies  VITALS:  Blood pressure 108/60, pulse 75, temperature 97.7 F (36.5 C), resp. rate (!) 24, height  (1.702 m), weight 111.4 kg (245 lb 9.5 oz), SpO2 92 %.  PHYSICAL EXAMINATION:  GENERAL:  56 y.o.-year-old Caucasian  transgender patient  lying in the bed with no acute distress.  EYES: Pupils equal, round, reactive to light and accommodation. No scleral icterus. Extraocular muscles intact.  HEENT: Head atraumatic, normocephalic. Oropharynx and nasopharynx clear.  NECK:  Supple, no jugular venous distention. No thyroid enlargement, no tenderness.  LUNGS: Diminished  breath sounds bilaterally,  ET tube is intact no wheezing, rales,rhonchi or crepitation. No use of accessory muscles of respiration.  CARDIOVASCULAR: S1, S2 normal. No murmurs, rubs, or gallops.  ABDOMEN: Soft, nontender, distended. Ascites Bowel sounds present. No organomegaly or mass.  EXTREMITIES: Right BKA with 1+ pitting edema the left lower extremity No  cyanosis, or  clubbing.  NEUROLOGIC: Alert but intermittent episodes of lethargy. Sensation intact. Gait not checked.  PSYCHIATRIC: The patient is alert and intermittent episodes of lethargy but following commands.  SKIN: Perineal area is erythematous, pressure ulcers on the back, sacral area, multiple nonhealing decub with his ulcers in several stages   LABORATORY PANEL:   CBC  Recent Labs Lab 06/18/16 0500  WBC 12.9*  HGB 7.9*  HCT 24.4*  PLT 154   ------------------------------------------------------------------------------------------------------------------  Chemistries   Recent Labs Lab 06/16/16 0525 06/17/16 0531 06/18/16 0500  NA 140 143 142  K 3.8 4.2 3.7  CL 118* 118* 118*  CO2 21* 20* 21*  GLUCOSE 199* 155* 251*  BUN CREATININE 1.12* 0.91 1.01*  CALCIUM 7.9* 7.9* 8.2*  MG 2.0 2.0  --   AST 19  --   --   ALT 18  --   --   ALKPHOS 103  --   --   BILITOT 1.1  --   --    ------------------------------------------------------------------------------------------------------------------  Cardiac Enzymes  Recent Labs Lab 06/14/16 0420  TROPONINI <0.03   ------------------------------------------------------------------------------------------------------------------  RADIOLOGY:  Dg Chest 1 View  Result Date: 06/18/2016 CLINICAL DATA:  56 year old female with a history of respiratory failure. Intubation. EXAM: CHEST 1 VIEW COMPARISON:  06/18/2016 FINDINGS: Same-day plain film demonstrates placement of endotracheal tube, which terminates in the distal trachea approximately 1.5 cm above the carina. Unchanged right upper extremity PICC, appears to terminate in the superior vena cava. Unchanged gastric tube which terminates out of the field of view. Similar appearance of low lung volumes with bilateral interstitial and airspace opacities. Unchanged appearance of the cardiomediastinal silhouette. IMPRESSION: Interval placement of endotracheal tube, which  terminates  distal trachea approximately 1.5 cm above the carina. These results were called by telephone at the time of interpretation on 06/18/2016 at 8:46 am to the nurse Ms [facility], who verbally acknowledged these results. Unchanged right upper extremity PICC, appearing to terminate superior vena cava. Unchanged gastric tube. Similar appearance of extensive bilateral interstitial and airspace disease. Electronically Signed   By: Gilmer Mor D.O.   On: 06/18/2016 08:49   Dg Chest Port 1 View  Result Date: 06/18/2016 CLINICAL DATA:  Respiratory failure. EXAM: PORTABLE CHEST 1 VIEW COMPARISON:  06/16/2016 FINDINGS: Right PICC line and NG tube in stable position. Heart size stable. Bilateral pulmonary infiltrates/edema again noted. No significant change. Low lung volumes. No prominent pleural effusion. No pneumothorax. IMPRESSION: 1. Lines and tubes in stable position. 2. Persistent bilateral pulmonary infiltrates/ edema without significant change. Low lung volumes. Electronically Signed   By: Maisie Fus  Register   On: 06/18/2016 07:16   Dg Abd Portable 1v  Result Date: 06/16/2016 CLINICAL DATA:  NG tube placement EXAM: PORTABLE ABDOMEN - 1 VIEW COMPARISON:  Abdominal radiograph 06/19/2016 FINDINGS: The tip and side port of the nasogastric tube overlies the distal stomach. The tip may be in the first portion of the duodenum. Multiple loops of gas distended bowel are present. IMPRESSION: Nasogastric tube tip near the gastroduodenal junction. Electronically Signed   By: Deatra Robinson M.D.   On: 06/16/2016 22:06    EKG:   Orders placed or performed during the hospital encounter of 06/02/2016  . EKG 12-Lead  . EKG 12-Lead    ASSESSMENT AND PLAN:    #Acute hypoxic and hypercapnic respiratory failure secondary to fluid overload, healthcare associated pneumonia and pulmonary edema. Intubated , vent bundle by intensivist  # Sepsis with hypotension secondary to  healthcare associated pneumonia, continue patient on  broad-spectrum antibiotic therapy ; Solu-Cortef and Neo-Synephrine MRSA PCR is negative and blood cultures are negative so far, may need a paracentesis.Marland Kitchen  keep MAP greater than 60All. Follow-up repeat respiratory cultures  # Acute hepatic encephalopathy, Clinically improving continue  lactulose and Xifaxan, NG feeds #. Anasarca secondary to third spacing from severe malnutrition, patient will be benefited with IV albumin  #. Acute renal failure, ATN patient was given IV fluids in the emergency room; creatinine 1.95-1.59-1.Marland Kitchen3 --1.12--0.91 #. Hyponatremia in fluid overloaded patient, clinically resolved sodium at 137--140--143 today # Severe calorie and protein malnutrition, albumin 2.8 today . IV albumin,tube feeds #. Abnormal EKG with prolonged QTC, get magnesium level checked, hold QTC prolonging medications # sacral decubitus ulcer - wound care and reposition pt  Follow-up with palliative care  All the records are reviewed and case discussed with Care Management/Social Workerr. Management plans discussed with the patient, family and they are in agreement.  CODE STATUS: FC   TOTAL TIME TAKING CARE OF THIS PATIENT: 35  minutes.   POSSIBLE D/C IN ? DAYS, DEPENDING ON CLINICAL CONDITION.  Note: This dictation was prepared with Dragon dictation along with smaller phrase technology. Any transcriptional errors that result from this process are unintentional.   Ramonita Lab M.D on 06/18/2016 at 4:44 PM  Between 7am to 6pm - Pager - [phone] After 6pm go to [email or web address] - password [email or web address]  [facility]  Office  [phone]  CC: Primary care physician; Pcp Not In System

## 2016-06-18 NOTE — Progress Notes (Addendum)
0800 Informed per Dr. Richarda Osmond that patient was in need of intubation. All materials gathered and patient educated. RT ,charge nurse, patients nurse,nurse tech. and a nurse were present. After patient sedated rapid sequence intubation  was preformed and ETT placed per Dr. Ardyth Man.  After intubation started on Propofol for sedation. Patient tolerated procedure well See ICU  assessment for more details.

## 2016-06-18 NOTE — Progress Notes (Addendum)
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 Now with possible sepsis and hepatic encephalopathy.  Intubated 4/25  ASSESSMENT/PLAN    PULMONARY A:  --Acute hypoxic and hypercapnic respiratory failure, likely multifactorial from volume overload, pulmonary edema, possible underlying pneumonia.  --Intubated 4/25 --Blood gas 7.21/50/79/20. c/w Acute on chronic hypercapnic respiratory failure. -Chest x-ray personally reviewed by me for/25; diffuse bilateral infiltrates, pulmonary edema. P:   Continue diuresis as tolerated, continue vent support.   VENTILATOR SETTINGS: Vent Mode: PRVC FiO2 (%):  [40 %-50 %] 40 % Set Rate:  [15 bmp] 15 bmp Vt Set:  [500 mL] 500 mL PEEP:  [5 cmH20] 5 cmH20  CARDIOVASCULAR A: Equivocal serum cortisol, possible adrenal insufficiency.  Volume overload with pulm edema, likely due  --Echo 4/20 EF=60% P:  --Continue stress dose steroids.  --May need pressors.   HEMODYNAMICS:    RENAL A:  AKI P:   --Closely monitor UOP.  --Will start lasix infusion, may need pressors.    INTAKE / OUTPUT:  Intake/Output Summary (Last 24 hours) at 06/18/16 1253 Last data filed at 06/18/16 0700  Gross per 24 hour  Intake          1639.27 ml  Output             1410 ml  Net           229.27 ml    GASTROINTESTINAL A:  s/p "duodenal switch" bipap with chronic malabsorption and malnutrition with chronic edema.  P:   --Continue tube feeds.    HEMATOLOGIC A:  Chronic anemia.  P:  Transfuse as needed.   INFECTIOUS A:  Possible pneumonia.  P:   --Continue empiric abx.  --Check sputum culture.   Micro/culture  results: MRSA pcr 4/20; negative.  BCx2 4/20: Negative UC 4/3: negative.  Sputum: 4/25; pending.  Procalcitonin 4/22; negative.   Antibiotics: cefepime  4/20 >>   Vanc 4/20 >>  4/22  ENDOCRINE A:  Possible adrenal insufficiency P:   --Continue empiric steroids.   NEUROLOGIC A:  Acute metabolic/hepatic encephalopathy.  P:   --Continue rifaximin, lactulose.    MAJOR EVENTS/TEST RESULTS: Intubated 4/24.   STUDIES:  CT Head 04/20>>No acute intracranial abnormalities. The appearance of the brain is normal. US Abdomen Limited 04/20>>No evidence of ascites Echo 04/20>>Ef 60% to 65%  Best Practices DVT Prophylaxis: enoxparin GI Prophylaxis: protonix   --------------------------------------- Advance Care planning:  4/25; D/w patient he is lethargic, but was asked in presence of ICU RN about whether he would like to have continued measures to keep him alive, which he affirms, therefore pt was intubated. Case discussed with pt's sister, explained that he has not improved and that he may require transfer to [facility]. Prognosis for recovery seems poor, and would likely take a prolonged amount of time, therefore LTAC would be appropriate. Family agrees that he continues to want everything done which they had confirmed with him on day prior. Case discussed with palliative care.  Time spent 30 min.   ----------------------------------------   Name: [name] MRN: [id number] DOB: 1961-02-02    ADMISSION DATE:  05-Jul-2016    SUBJECTIVE:   Pt currently on the bipap, can not provide history or review of systems.   Review of Systems:  --  VITAL SIGNS: Temp:  [97.6 F (36.4 C)-98.9 F (37.2 C)] 98 F (36.7 C) (04/25 0200) Pulse Rate:  [74-100] 74 (04/25 1000) Resp:  [11-39] 15 (04/25 1000) BP: (77-141)/(50-75) 86/56 (04/25 1000) SpO2:  [87 %-100 %] 96 % (04/25 1135) FiO2 (%):  [40 %-50 %] 40 % (04/25 1135) Weight:  [245 lb 9.5 oz (111.4 kg)] 245 lb 9.5 oz (111.4 kg) (04/25  0500)     PHYSICAL EXAMINATION: Physical Examination:   VS: BP (!) 86/56   Pulse 74   Temp 98 F (36.7 C) (Axillary)   Resp 15   Ht  (1.702 m) Comment: estimated  Wt 245 lb 9.5 oz (111.4 kg) Comment: with 6 pillows  SpO2 96%   BMI 38.47 kg/m   General Appearance: lethargi Neuro:without focal findings, mental status reduced.  HEENT: PERRLA, EOM intact. Pulmonary: Scattered bilateral creps.   CardiovascularNormal S1,S2.  No m/r/g.   Abdomen: Benign, Soft, non-tender. Renal:  No costovertebral tenderness  GU:  Not performed at this time. Endocrine: No evident thyromegaly. Skin:   warm, no rashes, no ecchymosis  Extremities: normal, no cyanosis, clubbing.    LABORATORY PANEL:   CBC  Recent Labs Lab 06/18/16 0500  WBC 12.9*  HGB 7.9*  HCT 24.4*  PLT 154    Chemistries   Recent Labs Lab 06/16/16 0525 06/17/16 0531 06/18/16 0500  NA 140 143 142  K 3.8 4.2 3.7  CL 118* 118* 118*  CO2 21* 20* 21*  GLUCOSE 199* 155* 251*  BUN CREATININE 1.12 0.91 1.01  CALCIUM 7.9* 7.9* 8.2*  MG 2.0 2.0  --   PHOS 1.6* 2.3* 2.5  AST 19  --   --   ALT 18  --   --   ALKPHOS 103  --   --   BILITOT 1.1  --   --      Recent Labs Lab 06/17/16 1628 06/17/16 1945 06/17/16 2336 06/18/16 0352 06/18/16 0756 06/18/16 1146  GLUCAP 190* 204* 228* 228* 209* 261*    Recent Labs Lab 06/18/16 0945  PHART 7.21*  PCO2ART 50*  PO2ART 79*    Recent Labs Lab 06/14/16 0420 06/15/16 0438 06/16/16 0525  AST ALT ALKPHOS 125 96 103  BILITOT 1.4* 1.6* 1.1  ALBUMIN 2.8* 3.0* 2.7*    Cardiac Enzymes  Recent Labs Lab 06/14/16 0420  TROPONINI <0.03    RADIOLOGY:  Dg Chest 1 View  Result Date: 06/18/2016 CLINICAL DATA:  56 year old female with a history of respiratory failure. Intubation. EXAM: CHEST 1 VIEW COMPARISON:  06/18/2016 FINDINGS: Same-day plain film demonstrates placement of endotracheal tube, which terminates in the distal  trachea approximately 1.5 cm above the carina. Unchanged right upper extremity PICC, appears to terminate in the superior vena cava. Unchanged gastric tube which terminates out of the field of view. Similar appearance of low lung volumes with bilateral interstitial and airspace opacities. Unchanged appearance of the cardiomediastinal silhouette. IMPRESSION: Interval placement of endotracheal tube, which terminates distal trachea approximately 1.5 cm above the carina. These results were called by telephone at the time of interpretation on 06/18/2016 at 8:46 am to the nurse Ms [facility], who verbally acknowledged these results. Unchanged right upper extremity PICC, appearing to terminate superior vena cava. Unchanged gastric tube. Similar appearance of extensive bilateral interstitial and airspace disease. Electronically Signed   By: Gilmer Mor D.O.   On: 06/18/2016 08:49   Dg Chest Port 1  View  Result Date: 06/18/2016 CLINICAL DATA:  Respiratory failure. EXAM: PORTABLE CHEST 1 VIEW COMPARISON:  06/16/2016 FINDINGS: Right PICC line and NG tube in stable position. Heart size stable. Bilateral pulmonary infiltrates/edema again noted. No significant change. Low lung volumes. No prominent pleural effusion. No pneumothorax. IMPRESSION: 1. Lines and tubes in stable position. 2. Persistent bilateral pulmonary infiltrates/ edema without significant change. Low lung volumes. Electronically Signed   By: Maisie Fus  Register   On: 06/18/2016 07:16   Dg Abd Portable 1v  Result Date: 06/16/2016 CLINICAL DATA:  NG tube placement EXAM: PORTABLE ABDOMEN - 1 VIEW COMPARISON:  Abdominal radiograph 05/31/2016 FINDINGS: The tip and side port of the nasogastric tube overlies the distal stomach. The tip may be in the first portion of the duodenum. Multiple loops of gas distended bowel are present. IMPRESSION: Nasogastric tube tip near the gastroduodenal junction. Electronically Signed   By: Deatra Robinson M.D.   On: 06/16/2016  22:06        --Wells Guiles, MD.  ICU Pager: [phone] [facility] Office Number: [phone]   06/18/2016   Critical Care Attestation.  I have personally obtained a history, examined the patient, evaluated laboratory and imaging results, formulated the assessment and plan and placed orders. The Patient requires high complexity decision making for assessment and support, frequent evaluation and titration of therapies, application of advanced monitoring technologies and extensive interpretation of multiple databases. The patient has critical illness that could lead imminently to failure of 1 or more organ systems and requires the highest level of physician preparedness to intervene.  Critical Care Time devoted to patient care services described in this note is 45 minutes and is exclusive of time spent in procedures.

## 2016-06-18 NOTE — Care Management (Signed)
RNCM spoke with [name] regarding [employer], advertising and [facility]. She will review both and speak with patient's siblings. She will contact RNCM with decision.

## 2016-06-18 NOTE — Procedures (Signed)
Endotracheal Intubation: Patient required placement of an artificial airway secondary to resp failure. He was found in his room with worsening dyspnea on bipap, he was also significantly lethargic, and only able to say 3 to 4 words at a time due to dyspnea and fatigue. It was discussed with patient regarding whether he continue to want all aggressive measures, he confirmed that he wanted to try to live and continues to want all measures performed.   Consent: Emergent.   Hand washing performed prior to starting the procedure.   Medications administered for sedation prior to procedure: Midazolam 4 mg IV,  Vecuronium 10 mg IV, Fentanyl 50 mcg IV.   Procedure: A time out procedure was called and correct patient, name, & ID confirmed. Needed supplies and equipment were assembled and checked to include ETT, 10 ml syringe, Glidescope, Mac and Miller blades, suction, oxygen and bag mask valve, end tidal CO2 monitor. Patient was positioned to align the mouth and pharynx to facilitate visualization of the glottis.  Heart rate, SpO2 and blood pressure was continuously monitored during the procedure. Pre-oxygenation was conducted prior to intubation and endotracheal tube was placed through the vocal cords into the trachea.     The artificial airway was placed under direct visualization via glidescope route using a ETT on the first attempt.    ETT was secured at 24 cm mark.    Placement was confirmed by auscuitation of lungs with good breath sounds bilaterally and no stomach sounds.  Condensation was noted on endotracheal tube.  Pulse ox %.  CO2 detector in place with appropriate color change.   Complications: None .   Operator: Ashby Dawes  Chest radiograph ordered and pending.     Marda Stalker, M.D.

## 2016-06-19 ENCOUNTER — Inpatient Hospital Stay: Payer: 59

## 2016-06-19 ENCOUNTER — Ambulatory Visit: Payer: Self-pay | Admitting: Surgery

## 2016-06-19 DIAGNOSIS — Z66 Do not resuscitate: Secondary | ICD-10-CM

## 2016-06-19 DIAGNOSIS — D72829 Elevated white blood cell count, unspecified: Secondary | ICD-10-CM

## 2016-06-19 DIAGNOSIS — J9601 Acute respiratory failure with hypoxia: Secondary | ICD-10-CM

## 2016-06-19 LAB — GLUCOSE, CAPILLARY
GLUCOSE-CAPILLARY: 254 mg/dL — AB (ref 65–99)
GLUCOSE-CAPILLARY: 324 mg/dL — AB (ref 65–99)
GLUCOSE-CAPILLARY: 281 mg/dL — AB (ref 65–99)
GLUCOSE-CAPILLARY: 279 mg/dL — AB (ref 65–99)
Glucose-Capillary: 224 mg/dL — ABNORMAL HIGH (ref 65–99)
Glucose-Capillary: 242 mg/dL — ABNORMAL HIGH (ref 65–99)
Glucose-Capillary: 248 mg/dL — ABNORMAL HIGH (ref 65–99)
Glucose-Capillary: 265 mg/dL — ABNORMAL HIGH (ref 65–99)
Glucose-Capillary: 278 mg/dL — ABNORMAL HIGH (ref 65–99)
Glucose-Capillary: 295 mg/dL — ABNORMAL HIGH (ref 65–99)
Glucose-Capillary: 361 mg/dL — ABNORMAL HIGH (ref 65–99)

## 2016-06-19 LAB — POTASSIUM
Potassium: 3.3 mmol/L — ABNORMAL LOW (ref 3.5–5.1)
Potassium: 3.2 mmol/L — ABNORMAL LOW (ref 3.5–5.1)

## 2016-06-19 LAB — BLOOD GAS, ARTERIAL
ACID-BASE DEFICIT: 5.8 mmol/L — AB (ref 0.0–2.0)
BICARBONATE: 20.2 mmol/L (ref 20.0–28.0)
FIO2: 60
O2 Saturation: 93 %
PEEP: 5 cmH2O
PH ART: 7.3 — AB (ref 7.350–7.450)
Patient temperature: 37
RATE: 15 resp/min
VT: 500 mL
pCO2 arterial: 41 mmHg (ref 32.0–48.0)
pO2, Arterial: 74 mmHg — ABNORMAL LOW (ref 83.0–108.0)

## 2016-06-19 LAB — CBC
HCT: 24.1 % — ABNORMAL LOW (ref 35.0–47.0)
Hemoglobin: 7.8 g/dL — ABNORMAL LOW (ref 12.0–16.0)
MCH: 31.6 pg (ref 26.0–34.0)
MCHC: 32.4 g/dL (ref 32.0–36.0)
MCV: 97.5 fL (ref 80.0–100.0)
Platelets: 140 10*3/uL — ABNORMAL LOW (ref 150–440)
RBC: 2.47 MIL/uL — ABNORMAL LOW (ref 3.80–5.20)
RDW: 16 % — AB (ref 11.5–14.5)
WBC: 24.2 10*3/uL — AB (ref 3.6–11.0)

## 2016-06-19 LAB — BASIC METABOLIC PANEL
ANION GAP: 7 (ref 5–15)
BUN: 21 mg/dL — ABNORMAL HIGH (ref 6–20)
CALCIUM: 8.3 mg/dL — AB (ref 8.9–10.3)
CO2: 20 mmol/L — AB (ref 22–32)
Chloride: 116 mmol/L — ABNORMAL HIGH (ref 101–111)
Creatinine, Ser: 1.22 mg/dL — ABNORMAL HIGH (ref 0.44–1.00)
GFR calc Af Amer: 57 mL/min — ABNORMAL LOW (ref >60)
GFR, EST NON AFRICAN AMERICAN: 49 mL/min — AB (ref >60)
GLUCOSE: 282 mg/dL — AB (ref 65–99)
Potassium: 2.8 mmol/L — ABNORMAL LOW (ref 3.5–5.1)
Sodium: 143 mmol/L (ref 135–145)

## 2016-06-19 LAB — PHOSPHORUS
PHOSPHORUS: 1.5 mg/dL — AB (ref 2.5–4.6)
Phosphorus: 2.7 mg/dL (ref 2.5–4.6)

## 2016-06-19 LAB — MAGNESIUM: Magnesium: 2 mg/dL (ref 1.7–2.4)

## 2016-06-19 LAB — C DIFFICILE QUICK SCREEN W PCR REFLEX
C DIFFICILE (CDIFF) INTERP: NOT DETECTED
C DIFFICILE (CDIFF) TOXIN: NEGATIVE
C Diff antigen: NEGATIVE

## 2016-06-19 LAB — AMMONIA: Ammonia: 54 umol/L — ABNORMAL HIGH (ref 9–35)

## 2016-06-19 LAB — PROCALCITONIN: Procalcitonin: 1.47 ng/mL

## 2016-06-19 MED ORDER — LACTULOSE 10 GM/15ML PO SOLN
10.0000 g | Freq: Two times a day (BID) | ORAL | Status: DC
  Administered 2016-06-19: 10 g
  Filled 2016-06-19: qty 30

## 2016-06-19 MED ORDER — MIDAZOLAM HCL 2 MG/2ML IJ SOLN
2.0000 mg | INTRAMUSCULAR | Status: DC | PRN
  Administered 2016-06-20: 2 mg via INTRAVENOUS
  Filled 2016-06-19 (×5): qty 2

## 2016-06-19 MED ORDER — TRACE MINERALS CR-CU-MN-SE-ZN 10-1000-500-60 MCG/ML IV SOLN
INTRAVENOUS | Status: AC
  Administered 2016-06-19: 20:00:00 via INTRAVENOUS
  Filled 2016-06-19: qty 960

## 2016-06-19 MED ORDER — SODIUM CHLORIDE 0.9 % IV SOLN
INTRAVENOUS | Status: DC
  Administered 2016-06-19: 3 [IU]/h via INTRAVENOUS
  Filled 2016-06-19 (×2): qty 2.5

## 2016-06-19 MED ORDER — MIDAZOLAM HCL 2 MG/2ML IJ SOLN
2.0000 mg | INTRAMUSCULAR | Status: AC | PRN
  Administered 2016-06-19 – 2016-06-20 (×3): 2 mg via INTRAVENOUS
  Filled 2016-06-19: qty 2

## 2016-06-19 MED ORDER — POTASSIUM PHOSPHATES 15 MMOLE/5ML IV SOLN
30.0000 mmol | Freq: Once | INTRAVENOUS | Status: AC
  Administered 2016-06-19: 30 mmol via INTRAVENOUS
  Filled 2016-06-19: qty 10

## 2016-06-19 MED ORDER — SODIUM CHLORIDE 0.9 % IV SOLN
30.0000 meq | Freq: Once | INTRAVENOUS | Status: AC
  Administered 2016-06-19: 30 meq via INTRAVENOUS
  Filled 2016-06-19: qty 15

## 2016-06-19 MED ORDER — FENTANYL BOLUS VIA INFUSION
50.0000 ug | INTRAVENOUS | Status: DC | PRN
  Administered 2016-06-19 – 2016-06-20 (×4): 50 ug via INTRAVENOUS
  Filled 2016-06-19: qty 50

## 2016-06-19 MED ORDER — VITAL AF 1.2 CAL PO LIQD
1000.0000 mL | ORAL | Status: DC
  Administered 2016-06-21: 1000 mL

## 2016-06-19 MED ORDER — FENTANYL 2500MCG IN NS 250ML (10MCG/ML) PREMIX INFUSION
25.0000 ug/h | INTRAVENOUS | Status: DC
  Administered 2016-06-19: 300 ug/h via INTRAVENOUS
  Administered 2016-06-20: 125 ug/h via INTRAVENOUS
  Administered 2016-06-20: 300 ug/h via INTRAVENOUS
  Administered 2016-06-21: 200 ug/h via INTRAVENOUS
  Filled 2016-06-19 (×4): qty 250

## 2016-06-19 MED ORDER — SODIUM CHLORIDE 0.9 % IV SOLN
30.0000 meq | Freq: Once | INTRAVENOUS | Status: DC
  Filled 2016-06-19: qty 15

## 2016-06-19 NOTE — Progress Notes (Signed)
Patient ID: [name], female   DOB: 02/07/1961, 56 y.o.   MRN: [id number]  [employer] PROGRESS NOTE  [name] WUJ:[id number] DOB: 07/02/1960 DOA: 06/28/2016 PCP: Pcp Not In System  HPI/Subjective: Patient intubated and sedated  Objective: Vitals:   06/19/16 1000 06/19/16 1100  BP: (!) 94/57 120/70  Pulse: 63 62  Resp: (!) 25 (!) 29  Temp:      Filed Weights   06/16/16 0500 06/18/16 0500 06/19/16 0500  Weight: 112.1 kg (247 lb 2.2 oz) 111.4 kg (245 lb 9.5 oz) 104.7 kg (230 lb 13.2 oz)    ROS: Review of Systems  Unable to perform ROS: Critical illness   Exam: Physical Exam  Constitutional: She appears lethargic. She is intubated.  HENT:  Nose: No mucosal edema.  Eyes: Conjunctivae and lids are normal. Pupils are equal, round, and reactive to light.  Neck: Carotid bruit is not present. No thyromegaly present.  Cardiovascular: Regular rhythm, S1 normal, S2 normal and normal heart sounds.   Pulses:      Dorsalis pedis pulses are 0 on the left side.  Respiratory: She is intubated. She has decreased breath sounds in the right middle field, the right lower field and the left lower field. She has no wheezes. She has rhonchi in the right middle field, the right lower field, the left middle field and the left lower field. She has no rales.  GI: Soft. Bowel sounds are normal. She exhibits distension. There is no tenderness.  Musculoskeletal:       Left ankle: She exhibits swelling.  Lymphadenopathy:    She has cervical adenopathy.  Neurological: She appears lethargic.  Skin: Skin is warm.  Numerous decubitus ulcers  Psychiatric:  Intubated and sedated      Data Reviewed: Basic Metabolic Panel:  Recent Labs Lab 06/14/16 0420 06/15/16 0438 06/16/16 0525 06/17/16 0531 06/18/16 0500 06/18/16 2134 06/19/16 0251 06/19/16 0754  NA 134* 137 140 143 142  --   --  143  K 3.1* 3.4* 3.8 4.2 3.7 2.7* 3.3* 2.8*  CL 108 112* 118* 118* 118*  --   --  116*  CO2 20* 20*  21* 20* 21*  --   --  20*  GLUCOSE 139* 224* 199* 155* 251*  --   --  282*  BUN 21* --   --  21*  CREATININE 1.59* 1.33* 1.12* 0.91 1.01*  --   --  1.22*  CALCIUM 7.8* 7.7* 7.9* 7.9* 8.2*  --   --  8.3*  MG 1.9 1.9 2.0 2.0  --   --   --  2.0  PHOS  --  2.4* 1.6* 2.3* 2.5  --   --  1.5*   Liver Function Tests:  Recent Labs Lab 28-Jun-2016 0606 06/14/16 0420 06/15/16 0438 06/16/16 0525  AST 35 ALT ALKPHOS 162* 125 96 103  BILITOT 1.2 1.4* 1.6* 1.1  PROT 6.4* 6.0* 5.7* 5.8*  ALBUMIN 1.8* 2.8* 3.0* 2.7*    Recent Labs Lab 06/28/2016 0626 06/15/16 0506 06/18/16 0500 06/19/16 0913  AMMONIA 183* 74* 45* 54*   CBC:  Recent Labs Lab 2016/06/28 0606  06/15/16 0438 06/16/16 0525 06/17/16 0531 06/18/16 0500 06/19/16 0615  WBC 17.1*  < > 8.5 8.0 9.3 12.9* 24.2*  NEUTROABS 13.6*  --   --   --   --   --   --   HGB 10.2*  < > 7.3*  7.6* 7.9* 7.9* 7.8*  HCT 31.4*  < > 22.6* 23.1* 23.9* 24.4* 24.1*  MCV 96.5  < > 96.3 96.8 96.7 98.6 97.5  PLT 317  < > 175 147* 145* 154 140*  < > = values in this interval not displayed. Cardiac Enzymes:  Recent Labs Lab 06/02/2016 0607 06/14/16 0420  TROPONINI 0.03* <0.03    CBG:  Recent Labs Lab 06/18/16 1952 06/19/16 0013 06/19/16 0351 06/19/16 0731 06/19/16 1202  GLUCAP 270* 295* 248* 242* 254*    Recent Results (from the past 240 hour(s))  Culture, blood (Routine x 2)     Status: None   Collection Time: 06/12/2016  6:06 AM  Result Value Ref Range Status   Specimen Description BLOOD R FA  Final   Special Requests   Final    BOTTLES DRAWN AEROBIC AND ANAEROBIC Blood Culture adequate volume   Culture NO GROWTH 5 DAYS  Final   Report Status 06/18/2016 FINAL  Final  Culture, blood (Routine x 2)     Status: None   Collection Time: 06/17/2016  6:06 AM  Result Value Ref Range Status   Specimen Description BLOOD L FA  Final   Special Requests   Final    BOTTLES DRAWN AEROBIC AND ANAEROBIC Blood Culture  adequate volume   Culture NO GROWTH 5 DAYS  Final   Report Status 06/18/2016 FINAL  Final  MRSA PCR Screening     Status: None   Collection Time: 05/30/2016 11:45 AM  Result Value Ref Range Status   MRSA by PCR NEGATIVE NEGATIVE Final    Comment:        The GeneXpert MRSA Assay (FDA approved for NASAL specimens only), is one component of a comprehensive MRSA colonization surveillance program. It is not intended to diagnose MRSA infection nor to guide or monitor treatment for MRSA infections.   C difficile quick scan w PCR reflex     Status: None   Collection Time: 06/23/2016  1:45 PM  Result Value Ref Range Status   C Diff antigen NEGATIVE NEGATIVE Final   C Diff toxin NEGATIVE NEGATIVE Final   C Diff interpretation No C. difficile detected.  Final  Culture, respiratory (NON-Expectorated)     Status: None (Preliminary result)   Collection Time: 06/18/16  1:08 PM  Result Value Ref Range Status   Specimen Description TRACHEAL ASPIRATE  Final   Special Requests NONE  Final   Gram Stain   Final    ABUNDANT WBC PRESENT,BOTH PMN AND MONONUCLEAR NO ORGANISMS SEEN    Culture   Final    TOO YOUNG TO READ Performed at [facility], [address] [place], [place] [address]    Report Status PENDING  Incomplete  C difficile quick scan w PCR reflex     Status: None   Collection Time: 06/19/16 10:07 AM  Result Value Ref Range Status   C Diff antigen NEGATIVE NEGATIVE Final   C Diff toxin NEGATIVE NEGATIVE Final   C Diff interpretation No C. difficile detected.  Final     Studies: Dg Chest 1 View  Result Date: 06/18/2016 CLINICAL DATA:  56 year old female with a history of respiratory failure. Intubation. EXAM: CHEST 1 VIEW COMPARISON:  06/18/2016 FINDINGS: Same-day plain film demonstrates placement of endotracheal tube, which terminates in the distal trachea approximately 1.5 cm above the carina. Unchanged right upper extremity PICC, appears to terminate in the superior vena cava.  Unchanged gastric tube which terminates out of the field of  view. Similar appearance of low lung volumes with bilateral interstitial and airspace opacities. Unchanged appearance of the cardiomediastinal silhouette. IMPRESSION: Interval placement of endotracheal tube, which terminates distal trachea approximately 1.5 cm above the carina. These results were called by telephone at the time of interpretation on 06/18/2016 at 8:46 am to the nurse Ms [facility], who verbally acknowledged these results. Unchanged right upper extremity PICC, appearing to terminate superior vena cava. Unchanged gastric tube. Similar appearance of extensive bilateral interstitial and airspace disease. Electronically Signed   By: Gilmer Mor D.O.   On: 06/18/2016 08:49   Ct Head Wo Contrast  Result Date: 06/19/2016 CLINICAL DATA:  Acute hypoxia and hypercapnic respiratory failure secondary to fluid overload. Sepsis. EXAM: CT HEAD WITHOUT CONTRAST TECHNIQUE: Contiguous axial images were obtained from the base of the skull through the vertex without intravenous contrast. COMPARISON:  04-Jul-2016 CT FINDINGS: Brain: Wallace Cullens- white matter distinction is maintained without acute intracranial hemorrhage, midline shift or edema. Vascular: Mild-to-moderate bilateral carotid siphon calcifications. No hyperdense vessels. Skull: Normal. Negative for fracture or focal lesion. Sinuses/Orbits: No acute finding. Other: Partially visualized endotracheal and nasogastric tubes. IMPRESSION: No acute intracranial abnormality. New partially visualized endotracheal and nasogastric tubes are seen since prior exam. Electronically Signed   By: Tollie Eth M.D.   On: 06/19/2016 01:50   Dg Chest Port 1 View  Result Date: 06/19/2016 CLINICAL DATA:  Respiratory failure. EXAM: PORTABLE CHEST 1 VIEW COMPARISON:  06/18/2016. FINDINGS: Endotracheal tube, right PICC line, NG tube in stable position. Heart size normal. Diffuse bilateral pulmonary infiltrates. No pleural  effusion or pneumothorax. IMPRESSION: 1.  Lines and tubes in stable position. 2.  Bilateral airspace disease.  No change from prior exam. Electronically Signed   By: Maisie Fus  Register   On: 06/19/2016 06:41   Dg Chest Port 1 View  Result Date: 06/18/2016 CLINICAL DATA:  Respiratory failure. EXAM: PORTABLE CHEST 1 VIEW COMPARISON:  06/16/2016 FINDINGS: Right PICC line and NG tube in stable position. Heart size stable. Bilateral pulmonary infiltrates/edema again noted. No significant change. Low lung volumes. No prominent pleural effusion. No pneumothorax. IMPRESSION: 1. Lines and tubes in stable position. 2. Persistent bilateral pulmonary infiltrates/ edema without significant change. Low lung volumes. Electronically Signed   By: Maisie Fus  Register   On: 06/18/2016 07:16    Scheduled Meds: . calamine  1 application Topical Q8H  . collagenase   Topical Daily  . enoxaparin (LOVENOX) injection  40 mg Subcutaneous Q24H  . feeding supplement (PRO-STAT SUGAR FREE 64)  30 mL Oral TID  . fentaNYL (SUBLIMAZE) injection  100 mcg Intravenous Once  . folic acid  1 mg Per Tube Daily  . hydrocortisone sod succinate (SOLU-CORTEF) inj  50 mg Intravenous Q12H   Followed by  . [START ON 06/20/2016] hydrocortisone sod succinate (SOLU-CORTEF) inj  50 mg Intravenous Daily  . insulin aspart  0-9 Units Subcutaneous Q4H  . ipratropium-albuterol  3 mL Nebulization Q6H  . lactulose  30 g Per Tube BID  . levothyroxine  100 mcg Intravenous Daily  . mouth rinse  15 mL Mouth Rinse BID  . multivitamin  15 mL Per Tube Daily  . pantoprazole (PROTONIX) IV  40 mg Intravenous Q24H  . potassium chloride  40 mEq Per Tube TID  . rifaximin  550 mg Per Tube BID  . sodium chloride flush  10-40 mL Intracatheter Q12H  . Vitamin D (Ergocalciferol)  50,000 Units Per Tube Weekly   Continuous Infusions: . albumin human Stopped (06/19/16 1610)  .  ceFEPIme (MAXIPIME) 2 GM IVP Stopped (06/19/16 0602)  . feeding supplement (VITAL AF 1.2  CAL) 1,000 mL (06/18/16 2000)  . furosemide (LASIX) infusion 6 mg/hr (06/19/16 0605)  . norepinephrine (LEVOPHED) Adult infusion 12 mcg/min (06/19/16 1014)  . phenylephrine (NEO-SYNEPHRINE) Adult infusion    . potassium phosphate IVPB (mmol) 30 mmol (06/19/16 1013)  . propofol (DIPRIVAN) infusion 22 mcg/kg/min (06/19/16 1015)    Assessment/Plan:  1. Septic shock secondary to healthcare associated pneumonia. Patient on cefepime, Solu-Cortef and pressors to maintain blood pressure. 2. Acute hypoxic respiratory failure. When I saw him today he was on the ventilator at 100% FiO2. 3. Acute diastolic congestive heart failure, anasarca and severe protein calorie malnutrition. Patient is on Lasix drip. Overall prognosis is poor. Appreciate palliative care consultation. 4. Acute hepatic encephalopathy and ascites. On lactulose and Xifaxan. 5. Acute Carson injury watch closely with diuresis 6. Numerous decubitus ulcers. Overall prognosis is poor 7. Hyponatremia. Sodium now in the normal range 8. Hypokalemia. Electrolyte protocol in the ICU 9. Chronic anemia and thrombocytopenia  Code Status:     Code Status Orders        Start     Ordered   06/19/16 0930  Do not attempt resuscitation (DNR)  Continuous    Question Answer Comment  In the event of cardiac or respiratory ARREST Do not call a "code blue"   In the event of cardiac or respiratory ARREST Do not perform Intubation, CPR, defibrillation or ACLS   In the event of cardiac or respiratory ARREST Use medication by any route, position, wound care, and other measures to relive pain and suffering. May use oxygen, suction and manual treatment of airway obstruction as needed for comfort.   Comments no re-intubation when extubated      06/19/16 0931    Code Status History    Date Active Date Inactive Code Status Order ID Comments User Context   06/08/2016  9:37 AM 06/19/2016  9:31 AM Full Code [id number]  Katharina Caper, MD ED   05/27/2016  8:38  PM 06/02/2016  7:52 PM Full Code [id number]  Enid Baas, MD Inpatient   04/28/2015  4:32 AM 05/02/2015 12:29 AM Full Code [id number]  Rodolph Bong, MD Inpatient   03/07/2014  9:05 PM 03/27/2014 10:36 PM Full Code [id number]  Barth Kirks Inpatient     Family Communication: As per critical care specialist Disposition Plan: To be determined  Consultants:  Critical care specialist  Palliative care  Antibiotics:  Cefepime  Time spent: 25 minutes  Alford Highland  [employer]

## 2016-06-19 NOTE — Progress Notes (Signed)
SLP Cancellation Note  Patient Details Name: [name] MRN: [id number] DOB: 1960-05-13   Cancelled treatment:       Reason Eval/Treat Not Completed: Patient declined, no reason specified;Medical issues which prohibited therapy (chart reviewed). Per chart review, pt has had a decline in status requiring oral intubation on 06/18/16. Pt has not weaned from vent at this time. Speech/dysphagia tx is not indicated at this time. ST services will f/u next 1-2 days in regard to pt's status.    Jerilynn Som, MS, CCC-SLP Senica Crall 06/19/2016, 5:44 PM

## 2016-06-19 NOTE — Progress Notes (Signed)
Patient with continued hypoxia.  Peep increased to 10 and fi02 increased to 100%.  Patient with abnormal body habitus and keeps head in awkward foreword position which potentially could be contributing to hypoxia.  Dana at bedside assessing patient. Chest x-ray to be obtained.

## 2016-06-19 NOTE — Progress Notes (Addendum)
Nutrition Follow-up   Late entry  DOCUMENTATION CODES:   Severe malnutrition in context of chronic illness  INTERVENTION:  -Discussed nutritional poc with Nash Mantis, plan to initiate TPN today. Pt with 3L of stool thus far this shift today with hx of BP. Pt has PICC line -Recommend starting 5%AA/15%Dextrose at rate of 40 ml/hr. Further recommendations to follow. Will need to take into account calories from diprivan -Plan to turn TF down to trickle feedings only at present  NUTRITION DIAGNOSIS:   Malnutrition related to chronic illness as evidenced by severe depletion of body fat, severe depletion of muscle mass, severe fluid accumulation.  Being addressed via TPN  GOAL:   Patient will meet greater than or equal to 90% of their needs  MONITOR:   PO intake, Supplement acceptance, Labs, Weight trends  REASON FOR ASSESSMENT:   Consult Assessment of nutrition requirement/status  ASSESSMENT:   Pt remains on full vent stupport Vital AF 1.2 at 55 ml/hr with Prostat  Pt with significant stool output, watery diarrhea, estimated at 3L so far today. Pt with documented 4 L of stool in previous 24 hours (? As to accuracy as report was that pt had 2.3 L of stool during same time frame).  Pt with significant anasarca, lasix drip stopped due to worsening [place] Started on levophed drip over night  Labs: phosphorus 1.5 (supplemented), potassium 2.8 (supplemented), CBGs 200-300s Meds: ss novolog, lactulose   Diet Order:  NPO  Skin:  Wound (see comment) (ulceration on leg, stage III back post fall)  Height:   Ht Readings from Last 1 Encounters:  Jun 27, 2016  (1.702 m)    Weight:   Wt Readings from Last 1 Encounters:  06/19/16 230 lb 13.2 oz (104.7 kg)    Ideal Body Weight:  67.2 kg  BMI:  Body mass index is 36.15 kg/m.  Estimated Nutritional Needs:   Kcal:  1225-1560 kcals (11-14 kcals/kg per ASPEN), 1892 kcals Modified [employer]  Protein:  >/= 135 g  Fluid:   >/= 2 L  EDUCATION NEEDS:   No education needs identified at this time  Romelle Starcher MS, RD, LDN [phone] Pager  [phone] Weekend/On-Call Pager

## 2016-06-19 NOTE — Progress Notes (Signed)
[name] with cardiopulmonary at bedside increasing fi02 from 40% to 60% due to 02 SATs 84-88%.

## 2016-06-19 NOTE — Progress Notes (Signed)
Transported pt to CT without incident, pt remained on the vent throughout.

## 2016-06-19 NOTE — Care Management (Signed)
Patient still requiring vent. RNCM spoke with RN caring for patient; suggested that RNCM wait to speak with family after MD/palliative meet with family.

## 2016-06-19 NOTE — Progress Notes (Signed)
Daily Progress Note   Patient Name: [name]       Date: 06/19/2016 DOB: 07/01/1960  Age: 56 y.o. MRN#: [id number] Attending Physician: Loletha Grayer, MD Primary Care Physician: Pcp Not In System Admit Date: 05/29/2016  Reason for Consultation/Follow-up: Establishing goals of care  Subjective: Danyla is intubated and sedated. Increase FIO2 this am. Not able to wean.   Met with two sisters, brother, and brother-in-law in family waiting area. Addalie does not have a spouse, children, or living parents so his adult siblings are his decision makers. No documented HCPOA or living will.   Gastric bypass 10 years ago. BKA 2 years ago. Mieshia has been living independently in an apartment and was still working as an Chief Financial Officer for [employer] and able to drive prior to fall. Family speaks of his nutritional decline since gastric bypass.   Discussed diagnoses, poor prognosis, interventions, and chronic co-morbidities that are contributing. Recent fall with hospitalization and rib fractures.  Maverick has spoke of "wanting everything done" if he should decline. We discussed big picture of aggressive interventions--including possible trach/feeding tube and not returning home to live independently. Life being prolonged by medical interventions but not a quality of life. Isobella spoke of "wanting to fight to win." Brother-in-law continuously states "this isn't living."   They know he would not want to live in a vegetative state. "Live just to live." Siblings speak of his love for food and do not think he would want a feeding tube if he "couldn't enjoy eating."   They feel he has been thinking about death but has "wanted to remain strong" for the family and his nieces and nephews who he loves very much. He has spoken of  "not being afraid of death because of the other side."   At this point, family would like to see how he responds to treatment in the next 24-48 hours. Hopeful that he could attempt vent wean but also seem very realistic in how critically ill he is.   Briefly discussed transition to comfort measures only with focus on comfort, quality, and dignity. Discussed the process of starting medications for comfort prior to extubation. Sister speaks of her mother dying peacefully with hospice services.   Family agreeable with DNR with no re-intubation if extubated. They do not want him to suffer at the end  of his life and understand he likely would not survive resuscitation if attempted.   Answered questions and concerns. Hard Choices copy given.    Length of Stay: 6  Current Medications: Scheduled Meds:  . calamine  1 application Topical U1L  . collagenase   Topical Daily  . enoxaparin (LOVENOX) injection  40 mg Subcutaneous Q24H  . feeding supplement (PRO-STAT SUGAR FREE 64)  30 mL Oral TID  . fentaNYL (SUBLIMAZE) injection  100 mcg Intravenous Once  . folic acid  1 mg Per Tube Daily  . hydrocortisone sod succinate (SOLU-CORTEF) inj  50 mg Intravenous Q12H   Followed by  . [START ON 06/20/2016] hydrocortisone sod succinate (SOLU-CORTEF) inj  50 mg Intravenous Daily  . insulin aspart  0-9 Units Subcutaneous Q4H  . ipratropium-albuterol  3 mL Nebulization Q6H  . lactulose  30 g Per Tube BID  . levothyroxine  100 mcg Intravenous Daily  . mouth rinse  15 mL Mouth Rinse BID  . multivitamin  15 mL Per Tube Daily  . pantoprazole (PROTONIX) IV  40 mg Intravenous Q24H  . potassium chloride  40 mEq Per Tube TID  . rifaximin  550 mg Per Tube BID  . sodium chloride flush  10-40 mL Intracatheter Q12H  . Vitamin D (Ergocalciferol)  50,000 Units Per Tube Weekly    Continuous Infusions: . albumin human Stopped (06/19/16 2440)  . ceFEPIme (MAXIPIME) 2 GM IVP Stopped (06/19/16 0602)  . feeding supplement  (VITAL AF 1.2 CAL) 1,000 mL (06/18/16 2000)  . furosemide (LASIX) infusion 6 mg/hr (06/19/16 0605)  . norepinephrine (LEVOPHED) Adult infusion 12.053 mcg/min (06/18/16 2000)  . phenylephrine (NEO-SYNEPHRINE) Adult infusion    . potassium phosphate IVPB (mmol)    . propofol (DIPRIVAN) infusion 22 mcg/kg/min (06/19/16 0605)    PRN Meds: HYDROmorphone (DILAUDID) injection, ipratropium-albuterol, [DISCONTINUED] ondansetron **OR** ondansetron (ZOFRAN) IV, sodium chloride flush  Physical Exam  Constitutional: She appears lethargic. She appears ill. She is sedated and intubated.  HENT:  Head: Normocephalic and atraumatic.  Cardiovascular: Regular rhythm.   Pulmonary/Chest: She is intubated. She has decreased breath sounds. She has rhonchi.  Abdominal: Normal appearance. Bowel sounds are decreased.  Musculoskeletal: She exhibits edema (generalized/anasarca).  Feet:  Right Foot: amputated Neurological: She appears lethargic.  Skin: Skin is warm and dry. Abrasion noted. There is pallor.  Psychiatric: Cognition and memory are impaired. She is inattentive.  Nursing note and vitals reviewed.          Vital Signs: BP (!) 100/57   Pulse 71   Temp 98.1 F (36.7 C) (Oral)   Resp (!) 26   Ht '5\' 7"'$  (1.702 m) Comment: estimated  Wt 104.7 kg (230 lb 13.2 oz)   SpO2 93%   BMI 36.15 kg/m  SpO2: SpO2: 93 % O2 Device: O2 Device: Ventilator O2 Flow Rate: O2 Flow Rate (L/min): 12 L/min  Intake/output summary:   Intake/Output Summary (Last 24 hours) at 06/19/16 1000 Last data filed at 06/19/16 0800  Gross per 24 hour  Intake          2614.91 ml  Output             6475 ml  Net         -3860.09 ml   LBM: Last BM Date: 06/19/16 Baseline Weight: Weight: 105.7 kg (233 lb) Most recent weight: Weight: 104.7 kg (230 lb 13.2 oz)       Palliative Assessment/Data: PPS 10%   Flowsheet Rows     Most  Recent Value  Intake Tab  Referral Department  Hospitalist  Unit at Time of Referral  ER    Palliative Care Primary Diagnosis  Other (Comment) [vascular]  Date Notified  06/12/2016  Palliative Care Type  New Palliative care  Reason for referral  Clarify Goals of Care  Date of Admission  06/05/2016  Date first seen by Palliative Care  06/15/2016  # of days IP prior to Palliative referral  0  Clinical Assessment  Palliative Performance Scale Score  10%  Psychosocial & Spiritual Assessment  Palliative Care Outcomes  Patient/Family meeting held?  Yes  Who was at the meeting?  sisters, brother, and brother-in-law  [place] goals of care, Changed CPR status, Provided end of life care assistance, ACP counseling assistance, Provided psychosocial or spiritual support, Counseled regarding hospice      Patient Active Problem List   Diagnosis Date Noted  . Acute encephalopathy   . Hyperammonemia ([place])   . Acute respiratory failure ([place])   . Anasarca 06/18/2016  . Acute hepatic encephalopathy 06/07/2016  . Acute renal failure (ARF) ([place]) 06/02/2016  . Severe protein-calorie malnutrition Altamease Oiler: less than 60% of standard weight) ([place]) 05/31/2016  . Hyponatremia 06/14/2016  . Leukocytosis 06/12/2016  . Ascites 06/01/2016  . Hepatic encephalopathy ([place]) 06/01/2016  . Healthcare-associated pneumonia   . Palliative care by specialist   . Goals of care, counseling/discussion   . Protein-calorie malnutrition, severe 05/29/2016  . Pressure injury of skin 05/28/2016  . Rib fractures 05/27/2016  . Chronic edema   . Finger fracture, right   . Benign essential HTN   . Chronic pain syndrome   . Venous stasis ulcer ([place])   . Cellulitis 04/28/2015  . Anemia 04/28/2015  . Weakness generalized 04/28/2015  . Hypokalemia 04/28/2015  . Pressure ulcer of buttock, unstageable ([place]) 04/28/2015  . Scrotal swelling 04/28/2015  . Fall 04/28/2015  . Protein-calorie malnutrition ([place]) 04/28/2015  . Weakness 04/28/2015  . Cellulitis, scrotum   . Failure to thrive in adult    . Fungal dermatitis   . Generalized weakness   . Muscular deconditioning   . Scrotal edema   . Swelling of scrotum   . Hypothyroidism 01/17/2015  . Anemia, iron deficiency 01/17/2015  . Vitamin D deficiency 01/17/2015  . S/P bariatric surgery 01/17/2015  . DM type 2 (diabetes mellitus, type 2) ([place]) 01/17/2015  . Venous stasis ulcer of left lower extremity ([place]) 12/14/2013  . Chronic pain 07/10/2013  . 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 ([facility]) 07/09/2013    Palliative Care Assessment & Plan   Patient Profile: 56 y.o. female  with past medical history of venous stasis, right BKA, diabetes, depression, anxiety, anemia, chronic pain, sleep apnea, malnutrition, s/p gastric bypass, combined reduction mammaplasty with abdominoplasty (2009), and uncompleted gender reassignment surgery in [place]    admitted on 05/31/2016 with altered mental status and low blood pressure. Recent hospital admission s/p fall from wheelchair and treated for UTI, weakness, and multiple rib fractures. In ED, patient was lethargic, hypotensive, with ammonia level of 183. Also acute renal failure. Chest xray revealed bilateral pleural effusions, atelectasis vs. infiltrates. CT head negative. US abdomen reveals no ascites. Patient with acute hepatic encephalopathy. Palliative medicine consultation for goals of care.   Assessment: Septic shock with hypotension Healthcare associated pneumonia Acute respiratory failure on full support ventilator Hepatic Encephalopathy Hyperammonemia Severe malnutrition with hypoalbuminemia Anasarca secondary to severe malnutrition Acute kidney injury Multiple non-healing decubitus ulcers  Recommendations/Plan:  DNR  now with no re-intubation after extubation.   Watchful waiting for the next 24-48 hours. Family hopeful for vent wean but realistic with poor prognosis.   Likely transition to comfort in the next few days.   PMT will continue to support patient/family through  hospitalization.   Code Status:   DNR   Code Status Orders        Start     Ordered   06/04/2016 0937  Full code  Continuous     06/20/2016 0937    Code Status History    Date Active Date Inactive Code Status Order ID Comments User Context   05/27/2016  8:38 PM 06/02/2016  7:52 PM Full Code [id number]  Gladstone Lighter, MD Inpatient   04/28/2015  4:32 AM 05/02/2015 12:29 AM Full Code [id number]  Eugenie Filler, MD Inpatient   03/07/2014  9:05 PM 03/27/2014 10:36 PM Full Code [id number]  Tedra Coupe Inpatient       Prognosis:   Unable to determine guarded with sepsis and acute respiratory failure on full support vent, pneumonia, pulmonary edema, and severe malnutrition.   Discharge Planning:  To Be Determined  Care plan was discussed with patient, family, RN, Dr. Priscille Heidelberg NP  Thank you for allowing the Palliative Medicine Team to assist in the care of this patient.   Time In: 0815 Time Out: 0930 Total Time 80mn Prolonged Time Billed  yes      Greater than 50%  of this time was spent counseling and coordinating care related to the above assessment and plan.  MIhor Dow FNP-C Palliative Medicine Team  Phone: [phone]: [phone] Please contact Palliative Medicine Team phone at [phone] questions and concerns.

## 2016-06-19 NOTE — Progress Notes (Signed)
Patient 02 SATs decreased to 81-84%.  [name] with cardiopulmonary increased fi02 to 70%.

## 2016-06-19 NOTE — Progress Notes (Addendum)
[facility] Critical Care Medicine Progess Note    SYNOPSIS   KevinWeltyis a 56 y.o.malewith a known history of Multiple medical problems including diabetes mellitus, depression, hypertension, sleep apnea, anemia, right BKA, severe malnutrition due to previous gastric bypass with multiple non-healing decub ulcers, chronically debilitated, going through gender reassignment. Now with possible sepsis, hepatic encephalopathy, and acute respiratory failure secondary to pulmonary edema and ?HCAP mechanically intubated 04/25.   ASSESSMENT/PLAN   PULMONARY A:  Acute hypoxic and hypercapnic respiratory failure, likely multifactorial from volume overload, pulmonary edema, possible underlying pneumonia.  --Intubated 4/25 --Blood gas 04/26: 7.30/41/74/20.2  -Chest x-ray personally reviewed by Dr Nicholos Johns 04/26; continued diffuse bilateral infiltrates, pulmonary edema. --Pt desats when tilting head forward, ordered C-collar to keep neck straight.  P:   Full vent support wean as tolerated  Maintain O2 sats >92% VAP bundle  Lasix drip @ 6 mg/hr will stop at 14:00 today 04/26; had good diuresis, but no improvement in respiratory status.   VENTILATOR SETTINGS: Vent Mode: PRVC FiO2 (%):  [40 %-60 %] 60 % Set Rate:  [15 bmp] 15 bmp Vt Set:  [500 mL] 500 mL PEEP:  [5 cmH20] 5 cmH20 Pressure Support:  [7 cmH20] 7 cmH20  CARDIOVASCULAR A:  Equivocal serum cortisol, possible adrenal insufficiency Volume overload with pulm edema --Echo 4/20 EF=60% P:  Continue stress dose steroids Continue levophed gtt prn to maintain map >65 Continuous telemetry monitoring   HEMODYNAMICS:   RENAL A:   AKI Hypokalemia  P:   Continue lasix gtt  Trend BMP's Replace electrolytes as indicated  Will start scheduled potassium Strict intake and output   INTAKE / OUTPUT:  Intake/Output Summary (Last 24 hours) at 06/19/16 0855 Last data filed at 06/19/16 0800  Gross per 24 hour  Intake           2648.31 ml  Output             6475 ml  Net         -3826.69 ml    GASTROINTESTINAL A:   S/P "duodenal switch" with chronic malabsorption and malnutrition with chronic edema.  P:   Continue tube feeds  Continue protonix for PUD prophylaxis   HEMATOLOGIC A:   Chronic anemia without acute blood loss  Thrombocytopenia  P:  Trend CBC Lovenox for VTE prophylaxis  Monitor for s/sx of bleeding  INFECTIOUS A:   ?HCAP Worsening leukocytosis    P:   Trend WBC's and monitor fever curve Trend PCT's Continue empiric abx Follow cultures   Micro/culture results: MRSA pcr 4/20; negative BCx2 4/20>> negative UC 4/3>>negative  Sputum: 4/25; pending  Cdiff 04/20>>negative  Cdiff 04/26>> Procalcitonin 4/22; negative  Antibiotics: Cefepime  4/20 >>   Vanc 4/20 >>4/22  ENDOCRINE A:   Hyperglycemia likely secondary to steroids  Possible adrenal insufficiency P:   Continue empiric steroids Continue SSI if pt remains hyperglycemic may need to start an insulin gtt  Will start lantus   NEUROLOGIC A:   Acute metabolic/hepatic encephalopathy  P:   Maintain RASS goal 0 to -1 Continue propofol gtt to maintain RASS goal  Continue rifaximin and lactulose WUA daily  Check ammonia level   MAJOR EVENTS/TEST RESULTS: 04/24-Pt mechanically intubated   STUDIES:  CT Head 04/20>>No acute intracranial abnormalities. The appearance of the brain is normal. US Abdomen Limited 04/20>>No evidence of ascites Echo 04/20>>Ef 60% to 65%  Best Practices DVT Prophylaxis: enoxparin GI Prophylaxis: protonix   ---------------------------------------   Name: [name] MRN: [id number] DOB:  [birth year]    ADMISSION DATE:  06/09/2016  SUBJECTIVE:  Pt remains mechanically intubated per RN overnight O2 requirements increased due to hypoxia.  Also, the pts pupils became unequal overnight CT of Head obtained, which was negative.  Review of Systems:  Unable to assess pt intubated    VITAL  SIGNS: Temp:  [97.5 F (36.4 C)-98.1 F (36.7 C)] 98.1 F (36.7 C) (04/26 0800) Pulse Rate:  [56-85] 71 (04/26 0800) Resp:  [14-37] 26 (04/26 0800) BP: (67-127)/(40-80) 100/57 (04/26 0800) SpO2:  [88 %-98 %] 93 % (04/26 0800) FiO2 (%):  [40 %-60 %] 60 % (04/26 0800) Weight:  [104.7 kg (230 lb 13.2 oz)] 104.7 kg (230 lb 13.2 oz) (04/26 0500)  LABS: Reviewed   PHYSICAL EXAMINATION: Physical Examination:   VS: BP (!) 100/57   Pulse 71   Temp 98.1 F (36.7 C) (Oral)   Resp (!) 26   Ht  (1.702 m) Comment: estimated  Wt 104.7 kg (230 lb 13.2 oz)   SpO2 93%   BMI 36.15 kg/m   General Appearance: chronically ill appearing Caucasian transgender female to female, mechanically intubated, NAD  Neuro: sedated not following commands, bilateral pupils unequal right 3 mm and left 2 mm HEENT: supple, no JVD, mild kyphosis  Pulmonary: diminished throughout, even, non labored, mechanically intubated   Cardiovascular: nsr, rrr, no M/R/G Abdomen: Benign, Soft, non-tender Skin:   warm, no rashes, no ecchymosis  Extremities: normal, no cyanosis, clubbing  RADIOLOGY:  Dg Chest 1 View  Result Date: 06/18/2016 CLINICAL DATA:  56 year old female with a history of respiratory failure. Intubation. EXAM: CHEST 1 VIEW COMPARISON:  06/18/2016 FINDINGS: Same-day plain film demonstrates placement of endotracheal tube, which terminates in the distal trachea approximately 1.5 cm above the carina. Unchanged right upper extremity PICC, appears to terminate in the superior vena cava. Unchanged gastric tube which terminates out of the field of view. Similar appearance of low lung volumes with bilateral interstitial and airspace opacities. Unchanged appearance of the cardiomediastinal silhouette. IMPRESSION: Interval placement of endotracheal tube, which terminates distal trachea approximately 1.5 cm above the carina. These results were called by telephone at the time of interpretation on 06/18/2016 at 8:46 am to the  nurse [facility] who verbally acknowledged these results. Unchanged right upper extremity PICC, appearing to terminate superior vena cava. Unchanged gastric tube. Similar appearance of extensive bilateral interstitial and airspace disease. Electronically Signed   By: Gilmer Mor D.O.   On: 06/18/2016 08:49   Ct Head Wo Contrast  Result Date: 06/19/2016 CLINICAL DATA:  Acute hypoxia and hypercapnic respiratory failure secondary to fluid overload. Sepsis. EXAM: CT HEAD WITHOUT CONTRAST TECHNIQUE: Contiguous axial images were obtained from the base of the skull through the vertex without intravenous contrast. COMPARISON:  06/20/2016 CT FINDINGS: Brain: Wallace Cullens- white matter distinction is maintained without acute intracranial hemorrhage, midline shift or edema. Vascular: Mild-to-moderate bilateral carotid siphon calcifications. No hyperdense vessels. Skull: Normal. Negative for fracture or focal lesion. Sinuses/Orbits: No acute finding. Other: Partially visualized endotracheal and nasogastric tubes. IMPRESSION: No acute intracranial abnormality. New partially visualized endotracheal and nasogastric tubes are seen since prior exam. Electronically Signed   By: Tollie Eth M.D.   On: 06/19/2016 01:50   Dg Chest Port 1 View  Result Date: 06/19/2016 CLINICAL DATA:  Respiratory failure. EXAM: PORTABLE CHEST 1 VIEW COMPARISON:  06/18/2016. FINDINGS: Endotracheal tube, right PICC line, NG tube in stable position. Heart size normal. Diffuse bilateral pulmonary infiltrates. No pleural effusion or pneumothorax. IMPRESSION:  1.  Lines and tubes in stable position. 2.  Bilateral airspace disease.  No change from prior exam. Electronically Signed   By: Maisie Fus  Register   On: 06/19/2016 06:41   Dg Chest Port 1 View  Result Date: 06/18/2016 CLINICAL DATA:  Respiratory failure. EXAM: PORTABLE CHEST 1 VIEW COMPARISON:  06/16/2016 FINDINGS: Right PICC line and NG tube in stable position. Heart size stable. Bilateral  pulmonary infiltrates/edema again noted. No significant change. Low lung volumes. No prominent pleural effusion. No pneumothorax. IMPRESSION: 1. Lines and tubes in stable position. 2. Persistent bilateral pulmonary infiltrates/ edema without significant change. Low lung volumes. Electronically Signed   By: Maisie Fus  Register   On: 06/18/2016 07:16   -Update: Pts sisters and brother updated concerning plan of care and questions answered.  They spoke with Palliative Care regarding goals of treatment and code status they have decided to change code status to DO NOT RESUSCITATE.    Sonda Rumble, AGNP  Pulmonary/Critical Care Pager [phone] (please enter 7 digits) PCCM Consult Pager [phone] (please enter 7 digits)  Pt seen and examined with Np, above note reflects my findings, assessment and plan. Pts lungs continue to sound coarse, my review of her CXR images personally, are little changed with aggressive diuresis on lasix drip.  Pt continue to have high O2 and PEEP requirements, he is also tilting his head forward which is kinking the tube and making his sats lower.  Continue empiric abx, vent support. Agree with DNR status, prognosis appears poor at this point.   Wells Guiles, M.D.  06/19/2016

## 2016-06-19 NOTE — Progress Notes (Signed)
Inpatient Diabetes Program Recommendations  AACE/ADA: New Consensus Statement on Inpatient Glycemic Control (2015)  Target Ranges:  Prepandial:   less than 140 mg/dL      Peak postprandial:   less than 180 mg/dL (1-2 hours)      Critically ill patients:  140 - 180 mg/dL   Lab Results  Component Value Date   GLUCAP 254 (H) 06/19/2016   HGBA1C 4.4 (L) 06-Jul-2016    Review of Glycemic Control  Results for MARCENE, LASKOWSKI (MRN [id number]) as of 06/19/2016 14:29  Ref. Range 06/18/2016 19:52 06/19/2016 00:13 06/19/2016 03:51 06/19/2016 07:31 06/19/2016 12:02  Glucose-Capillary Latest Ref Range: 65 - 99 mg/dL 409 (H) 811 (H) 914 (H) 242 (H) 254 (H)    Diabetes history: DM2 hx (no meds needed after bariatric surgery)  Outpatient Diabetes medications: None Current orders for Inpatient glycemic control: Novolog 0-9 units Q4H  Inpatient Diabetes Program Recommendations: Correction (SSI): If appropriate, "family would like to see how he responds to treatment in the next 24-48 hours" , please consider ordering ICU Glycemic control order set phase 2 as patient is critically ill, vented, ordered steroids, and glucose is elevated consistently above 200 mg/dl.   Susette Racer, RN, BA, MHA, CDE Diabetes Coordinator Inpatient Diabetes Program  [phone] (Team Pager) [phone] [facility] Office) 06/19/2016 2:33 PM

## 2016-06-20 ENCOUNTER — Inpatient Hospital Stay: Payer: 59

## 2016-06-20 DIAGNOSIS — J9690 Respiratory failure, unspecified, unspecified whether with hypoxia or hypercapnia: Secondary | ICD-10-CM

## 2016-06-20 LAB — CULTURE, RESPIRATORY: CULTURE: NORMAL

## 2016-06-20 LAB — GLUCOSE, CAPILLARY
GLUCOSE-CAPILLARY: 122 mg/dL — AB (ref 65–99)
GLUCOSE-CAPILLARY: 170 mg/dL — AB (ref 65–99)
GLUCOSE-CAPILLARY: 175 mg/dL — AB (ref 65–99)
GLUCOSE-CAPILLARY: 188 mg/dL — AB (ref 65–99)
GLUCOSE-CAPILLARY: 191 mg/dL — AB (ref 65–99)
GLUCOSE-CAPILLARY: 193 mg/dL — AB (ref 65–99)
GLUCOSE-CAPILLARY: 206 mg/dL — AB (ref 65–99)
GLUCOSE-CAPILLARY: 215 mg/dL — AB (ref 65–99)
Glucose-Capillary: 107 mg/dL — ABNORMAL HIGH (ref 65–99)
Glucose-Capillary: 109 mg/dL — ABNORMAL HIGH (ref 65–99)
Glucose-Capillary: 121 mg/dL — ABNORMAL HIGH (ref 65–99)
Glucose-Capillary: 138 mg/dL — ABNORMAL HIGH (ref 65–99)
Glucose-Capillary: 141 mg/dL — ABNORMAL HIGH (ref 65–99)
Glucose-Capillary: 143 mg/dL — ABNORMAL HIGH (ref 65–99)
Glucose-Capillary: 154 mg/dL — ABNORMAL HIGH (ref 65–99)
Glucose-Capillary: 154 mg/dL — ABNORMAL HIGH (ref 65–99)
Glucose-Capillary: 164 mg/dL — ABNORMAL HIGH (ref 65–99)
Glucose-Capillary: 165 mg/dL — ABNORMAL HIGH (ref 65–99)
Glucose-Capillary: 166 mg/dL — ABNORMAL HIGH (ref 65–99)
Glucose-Capillary: 169 mg/dL — ABNORMAL HIGH (ref 65–99)
Glucose-Capillary: 201 mg/dL — ABNORMAL HIGH (ref 65–99)
Glucose-Capillary: 211 mg/dL — ABNORMAL HIGH (ref 65–99)
Glucose-Capillary: 216 mg/dL — ABNORMAL HIGH (ref 65–99)

## 2016-06-20 LAB — COMPREHENSIVE METABOLIC PANEL
ALBUMIN: 2.7 g/dL — AB (ref 3.5–5.0)
ALK PHOS: 76 U/L (ref 38–126)
ALT: 15 U/L (ref 14–54)
ANION GAP: 2 — AB (ref 5–15)
AST: 22 U/L (ref 15–41)
BUN: 26 mg/dL — ABNORMAL HIGH (ref 6–20)
CALCIUM: 7.4 mg/dL — AB (ref 8.9–10.3)
CHLORIDE: 118 mmol/L — AB (ref 101–111)
CO2: 20 mmol/L — AB (ref 22–32)
Creatinine, Ser: 1.58 mg/dL — ABNORMAL HIGH (ref 0.44–1.00)
GFR calc Af Amer: 42 mL/min — ABNORMAL LOW (ref >60)
GFR calc non Af Amer: 36 mL/min — ABNORMAL LOW (ref >60)
GLUCOSE: 481 mg/dL — AB (ref 65–99)
Potassium: 3.4 mmol/L — ABNORMAL LOW (ref 3.5–5.1)
SODIUM: 140 mmol/L (ref 135–145)
Total Bilirubin: 0.9 mg/dL (ref 0.3–1.2)
Total Protein: 6.1 g/dL — ABNORMAL LOW (ref 6.5–8.1)

## 2016-06-20 LAB — CBC WITH DIFFERENTIAL/PLATELET
BAND NEUTROPHILS: 0 %
BASOS PCT: 0 %
Basophils Absolute: 0 10*3/uL (ref 0–0.1)
Blasts: 0 %
EOS ABS: 0 10*3/uL (ref 0–0.7)
Eosinophils Relative: 0 %
HCT: 25.4 % — ABNORMAL LOW (ref 35.0–47.0)
HEMOGLOBIN: 7.7 g/dL — AB (ref 12.0–16.0)
Lymphocytes Relative: 8 %
Lymphs Abs: 2.3 10*3/uL (ref 1.0–3.6)
MCH: 31.5 pg (ref 26.0–34.0)
MCHC: 30.4 g/dL — ABNORMAL LOW (ref 32.0–36.0)
MCV: 103.6 fL — ABNORMAL HIGH (ref 80.0–100.0)
METAMYELOCYTES PCT: 1 %
MONO ABS: 0.8 10*3/uL (ref 0.2–0.9)
MYELOCYTES: 0 %
Monocytes Relative: 3 %
Neutro Abs: 25.1 10*3/uL — ABNORMAL HIGH (ref 1.4–6.5)
Neutrophils Relative %: 88 %
Other: 0 %
PLATELETS: 68 10*3/uL — AB (ref 150–440)
PROMYELOCYTES ABS: 0 %
RBC: 2.45 MIL/uL — ABNORMAL LOW (ref 3.80–5.20)
RDW: 17.2 % — ABNORMAL HIGH (ref 11.5–14.5)
WBC: 28.2 10*3/uL — ABNORMAL HIGH (ref 3.6–11.0)
nRBC: 0 /100 WBC

## 2016-06-20 LAB — BASIC METABOLIC PANEL
ANION GAP: 1 — AB (ref 5–15)
BUN: 29 mg/dL — ABNORMAL HIGH (ref 6–20)
CO2: 22 mmol/L (ref 22–32)
Calcium: 7.9 mg/dL — ABNORMAL LOW (ref 8.9–10.3)
Chloride: 121 mmol/L — ABNORMAL HIGH (ref 101–111)
Creatinine, Ser: 1.57 mg/dL — ABNORMAL HIGH (ref 0.44–1.00)
GFR calc Af Amer: 42 mL/min — ABNORMAL LOW (ref >60)
GFR, EST NON AFRICAN AMERICAN: 36 mL/min — AB (ref >60)
GLUCOSE: 184 mg/dL — AB (ref 65–99)
POTASSIUM: 4.1 mmol/L (ref 3.5–5.1)
Sodium: 144 mmol/L (ref 135–145)

## 2016-06-20 LAB — PROCALCITONIN: Procalcitonin: 2.15 ng/mL

## 2016-06-20 LAB — MAGNESIUM: Magnesium: 1.7 mg/dL (ref 1.7–2.4)

## 2016-06-20 LAB — BRAIN NATRIURETIC PEPTIDE: B NATRIURETIC PEPTIDE 5: 399 pg/mL — AB (ref 0.0–100.0)

## 2016-06-20 LAB — PHOSPHORUS: Phosphorus: 3.2 mg/dL (ref 2.5–4.6)

## 2016-06-20 MED ORDER — TRACE MINERALS CR-CU-MN-SE-ZN 10-1000-500-60 MCG/ML IV SOLN: INTRAVENOUS | Status: DC

## 2016-06-20 MED ORDER — SODIUM CHLORIDE 0.9 % IV BOLUS (SEPSIS)
500.0000 mL | Freq: Once | INTRAVENOUS | Status: DC
  Administered 2016-06-20: 500 mL via INTRAVENOUS

## 2016-06-20 MED ORDER — FAT EMULSION 20 % IV EMUL
250.0000 mL | INTRAVENOUS | Status: DC
  Administered 2016-06-20: 250 mL via INTRAVENOUS
  Filled 2016-06-20: qty 250

## 2016-06-20 MED ORDER — TRACE MINERALS CR-CU-MN-SE-ZN 10-1000-500-60 MCG/ML IV SOLN
INTRAVENOUS | Status: DC
  Filled 2016-06-20: qty 1992

## 2016-06-20 MED ORDER — SODIUM CHLORIDE 0.9 % IV BOLUS (SEPSIS)
250.0000 mL | Freq: Once | INTRAVENOUS | Status: AC
Start: 2016-06-20 — End: 2016-06-20
  Administered 2016-06-20: 250 mL via INTRAVENOUS

## 2016-06-20 MED ORDER — POTASSIUM CHLORIDE 20 MEQ/15ML (10%) PO SOLN
20.0000 meq | Freq: Three times a day (TID) | ORAL | Status: AC
  Administered 2016-06-20 (×3): 20 meq
  Filled 2016-06-20 (×3): qty 15

## 2016-06-20 MED ORDER — MAGNESIUM SULFATE 2 GM/50ML IV SOLN
2.0000 g | Freq: Once | INTRAVENOUS | Status: AC
  Administered 2016-06-20: 2 g via INTRAVENOUS
  Filled 2016-06-20: qty 50

## 2016-06-20 MED ORDER — LACTULOSE 10 GM/15ML PO SOLN
30.0000 g | Freq: Two times a day (BID) | ORAL | Status: DC
  Administered 2016-06-20 – 2016-06-21 (×3): 30 g
  Filled 2016-06-20 (×3): qty 60

## 2016-06-20 MED ORDER — PIPERACILLIN-TAZOBACTAM 3.375 G IVPB
3.3750 g | Freq: Three times a day (TID) | INTRAVENOUS | Status: DC
  Administered 2016-06-20 – 2016-06-21 (×3): 3.375 g via INTRAVENOUS
  Filled 2016-06-20 (×6): qty 50

## 2016-06-20 NOTE — Progress Notes (Signed)
Pharmacist and dietician were both consulted regarding turning the insulin drip off.  Due to the planned rate increase of TPN tonight, the consensus is to leave the insulin drip going. Will continue to assess.

## 2016-06-20 NOTE — Progress Notes (Signed)
Patient urine output has decreased significantly. Total for shift is 135 ml. Bladder scan completed at 0329 equaled 298 ml in bladder. Bincy NP at bedside for second bladder scan at 0607 which resulted . Orders to administer 250 ml bolus of NS.

## 2016-06-20 NOTE — Progress Notes (Signed)
Daily Progress Note   Patient Name: [name]       Date: 06/20/2016 DOB: 08/16/1960  Age: 56 y.o. MRN#: [id number] Attending Physician: Alford Highland, MD Primary Care Physician: Pcp Not In System Admit Date: 06/05/2016  Reason for Consultation/Follow-up: Establishing goals of care  Subjective: RN currently performing wake-up assessment. Patient is unresponsive. No signs or symptoms of pain or discomfort.  Sister, [name], at bedside. [name] understands [name] is not showing improvement and unresponsive during wake-up assessment. Very tearful. She confirms [name] would not want to live life in a vegetative state. Siblings left today but she plans to communicate with them throughout the day. She states "We have other decisions that need to be made soon." Offered emotional and spiritual support. Answered questions and concerns.   Length of Stay: 7  Current Medications: Scheduled Meds:  . calamine  1 application Topical Q8H  . collagenase   Topical Daily  . enoxaparin (LOVENOX) injection  40 mg Subcutaneous Q24H  . fentaNYL (SUBLIMAZE) injection  100 mcg Intravenous Once  . hydrocortisone sod succinate (SOLU-CORTEF) inj  50 mg Intravenous Daily  . ipratropium-albuterol  3 mL Nebulization Q6H  . lactulose  10 g Per Tube BID  . levothyroxine  100 mcg Intravenous Daily  . mouth rinse  15 mL Mouth Rinse BID  . pantoprazole (PROTONIX) IV  40 mg Intravenous Q24H  . potassium chloride  20 mEq Per Tube TID  . rifaximin  550 mg Per Tube BID  . sodium chloride flush  10-40 mL Intracatheter Q12H  . Vitamin D (Ergocalciferol)  50,000 Units Per Tube Weekly    Continuous Infusions: . Marland KitchenTPN (CLINIMIX-E) Adult 40 mL/hr at 06/20/16 0800  . albumin human 0 g (06/19/16 9147)  . ceFEPIme (MAXIPIME) 2 GM  IVP Stopped (06/20/16 8295)  . feeding supplement (VITAL AF 1.2 CAL) 1,000 mL (06/20/16 0800)  . fentaNYL infusion INTRAVENOUS 125 mcg/hr (06/20/16 0854)  . insulin (NOVOLIN-R) infusion 0.8 Units/hr (06/20/16 0905)  . magnesium sulfate 1 - 4 g bolus IVPB    . norepinephrine (LEVOPHED) Adult infusion 8 mcg/min (06/20/16 0800)    PRN Meds: fentaNYL, ipratropium-albuterol, midazolam, midazolam, [DISCONTINUED] ondansetron **OR** ondansetron (ZOFRAN) IV, sodium chloride flush  Physical Exam  Constitutional: She appears ill. She is sedated and intubated.  HENT:  Head: Normocephalic and atraumatic.  Cardiovascular: Regular rhythm.   Pulmonary/Chest: She is intubated. She has decreased breath sounds. She has rhonchi.  Abdominal: Normal appearance. Bowel sounds are decreased.  Musculoskeletal: She exhibits edema (generalized/anasarca).  Feet:  Right Foot: amputated Neurological: She is unresponsive.  Skin: Skin is warm and dry. Abrasion noted. There is pallor.  Nursing note and vitals reviewed.          Vital Signs: BP (!) 91/57 (BP Location: Left Arm)   Pulse 92   Temp 97.8 F (36.6 C) (Oral)   Resp 15   Ht  (1.702 m) Comment: estimated  Wt 109.6 kg (241 lb 10 oz)   SpO2 96%   BMI 37.84 kg/m  SpO2: SpO2: 96 % O2 Device: O2 Device: Ventilator O2 Flow Rate: O2 Flow Rate (L/min): 12 L/min  Intake/output summary:   Intake/Output Summary (Last 24 hours) at 06/20/16 0933 Last data filed at 06/20/16 0800  Gross per 24 hour  Intake          3843.39 ml  Output             2585 ml  Net          1258.39 ml   LBM: Last BM Date: 06/20/16 Baseline Weight: Weight: 105.7 kg (233 lb) Most recent weight: Weight: 109.6 kg (241 lb 10 oz)    Palliative Assessment/Data: PPS 10%   Flowsheet Rows     Most Recent Value  Intake Tab  Referral Department  Hospitalist  Unit at Time of Referral  ER  Palliative Care Primary Diagnosis  Other (Comment) [vascular]  Date Notified  Jun 25, 2016    Palliative Care Type  New Palliative care  Reason for referral  Clarify Goals of Care  Date of Admission  Jun 25, 2016  Date first seen by Palliative Care  2016-06-25  # of days IP prior to Palliative referral  0  Clinical Assessment  Palliative Performance Scale Score  10%  Psychosocial & Spiritual Assessment  Palliative Care Outcomes  Patient/Family meeting held?  Yes  Who was at the meeting?  sisters, brother, and brother-in-law  Palliative Care Outcomes  Clarified goals of care, Changed CPR status, Provided end of life care assistance, ACP counseling assistance, Provided psychosocial or spiritual support, Counseled regarding hospice      Patient Active Problem List   Diagnosis Date Noted  . DNR (do not resuscitate)   . Acute encephalopathy   . Hyperammonemia (HCC)   . Acute respiratory failure (HCC)   . Anasarca Jun 25, 2016  . Acute hepatic encephalopathy Jun 25, 2016  . Acute renal failure (ARF) (HCC) 06/25/16  . Severe protein-calorie malnutrition Lily Kocher: less than 60% of standard weight) (HCC) 2016/06/25  . Hyponatremia 25-Jun-2016  . Leukocytosis 2016/06/25  . Ascites Jun 25, 2016  . Hepatic encephalopathy (HCC) 06-25-2016  . Healthcare-associated pneumonia   . Palliative care by specialist   . Goals of care, counseling/discussion   . Protein-calorie malnutrition, severe 05/29/2016  . Pressure injury of skin 05/28/2016  . Rib fractures 05/27/2016  . Chronic edema   . Finger fracture, right   . Benign essential HTN   . Chronic pain syndrome   . Venous stasis ulcer (HCC)   . Cellulitis 04/28/2015  . Anemia 04/28/2015  . Weakness generalized 04/28/2015  . Hypokalemia 04/28/2015  . Pressure ulcer of buttock, unstageable (HCC) 04/28/2015  . Scrotal swelling 04/28/2015  . Fall 04/28/2015  . Protein-calorie malnutrition (HCC) 04/28/2015  . Weakness 04/28/2015  . Cellulitis, scrotum   . Failure to thrive in adult   .  Fungal dermatitis   . Generalized weakness   . Muscular  deconditioning   . Scrotal edema   . Swelling of scrotum   . Hypothyroidism 01/17/2015  . Anemia, iron deficiency 01/17/2015  . Vitamin D deficiency 01/17/2015  . S/P bariatric surgery 01/17/2015  . DM type 2 (diabetes mellitus, type 2) (HCC) 01/17/2015  . Venous stasis ulcer of left lower extremity (HCC) 12/14/2013  . Chronic pain 07/10/2013  . 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 (HCC) 07/09/2013    Palliative Care Assessment & Plan   Patient Profile: 56 y.o. female  with past medical history of venous stasis, right BKA, diabetes, depression, anxiety, anemia, chronic pain, sleep apnea, malnutrition, s/p gastric bypass, combined reduction mammaplasty with abdominoplasty (2009), and uncompleted gender reassignment surgery in [place] admitted on Jul 11, 2016 with altered mental status and low blood pressure. Recent hospital admission s/p fall from wheelchair and treated for UTI, weakness, and multiple rib fractures. In ED, patient was lethargic, hypotensive, with ammonia level of 183. Also acute renal failure. Chest xray revealed bilateral pleural effusions, atelectasis vs. infiltrates. CT head negative. US abdomen reveals no ascites. Patient with acute hepatic encephalopathy. Worsening respiratory and mental status and intubated 06/18/16. Patient with pulmonary edema and pneumonia.   Assessment: Septic shock with hypotension Healthcare associated pneumonia Acute respiratory failure on full support ventilator Hepatic Encephalopathy Hyperammonemia Severe malnutrition with hypoalbuminemia Anasarca secondary to severe malnutrition Acute kidney injury Multiple non-healing decubitus ulcers  Recommendations/Plan:  DNR now with no re-intubation after extubation.   Watchful waiting for the next 24-48 hours. Family hopeful for vent wean but realistic with poor prognosis. Patient not responding during wake up assessment.  Sister confirms he would NOT want to live in a vegetative state.    Likely transition to comfort in the next few days.   PMT not at [facility] over the weekend but will f/u Monday to support patient/family.   Code Status:   DNR   Code Status Orders        Start     Ordered   2016-07-11 0937  Full code  Continuous     2016-07-11 0937    Code Status History    Date Active Date Inactive Code Status Order ID Comments User Context   05/27/2016  8:38 PM 06/02/2016  7:52 PM Full Code [id number]  Enid Baas, MD Inpatient   04/28/2015  4:32 AM 05/02/2015 12:29 AM Full Code [id number]  Rodolph Bong, MD Inpatient   03/07/2014  9:05 PM 03/27/2014 10:36 PM Full Code [id number]  Barth Kirks Inpatient       Prognosis:   Unable to determine guarded with sepsis and acute respiratory failure on full support vent, pneumonia, pulmonary edema, and severe malnutrition.   Discharge Planning:  To Be Determined  Care plan was discussed with patient, family, RN, Annabelle Harman NP  Thank you for allowing the Palliative Medicine Team to assist in the care of this patient.   Time In: 0900 Time Out: 0935 Total Time 35 Prolonged Time Billed no      Greater than 50%  of this time was spent counseling and coordinating care related to the above assessment and plan.  Vennie Homans, FNP-C Palliative Medicine Team  Phone: [phone] Fax: [phone]  Please contact Palliative Medicine Team phone at [phone] for questions and concerns.

## 2016-06-20 NOTE — Progress Notes (Addendum)
[facility] Medicine Progess Note    SYNOPSIS   KevinWeltyis a 56 y.o.malewith a known history of Multiple medical problems including diabetes mellitus, depression, hypertension, sleep apnea, anemia, right BKA, severe malnutrition due to previous gastric bypass with multiple non-healing decub ulcers, chronically debilitated, going through gender reassignment. Now with possible sepsis, hepatic encephalopathy, and acute respiratory failure secondary to pulmonary edema and ?HCAP mechanically intubated 04/25.   ASSESSMENT/PLAN   PULMONARY A:  Acute hypoxic and hypercapnic respiratory failure, likely multifactorial from volume overload, pulmonary edema, possible underlying pneumonia.  --Intubated 4/25  P:   Full vent support wean as tolerated . On 75%. PEEP 10 Maintain O2 sats >90%. VAP bundle  Will switch to zosyn.  VENTILATOR SETTINGS: Vent Mode: PRVC FiO2 (%):  [75 %-100 %] 75 % Set Rate:  [15 bmp] 15 bmp Vt Set:  [500 mL] 500 mL PEEP:  [10 cmH20] 10 cmH20  CARDIOVASCULAR A:  Equivocal serum cortisol, possible adrenal insufficiency Volume overload with pulm edema --Echo 4/20 EF=60% P:  Continue stress dose steroids Continuous telemetry monitoring   HEMODYNAMICS:   RENAL A:   AKI P:   Trend BMP's Replace electrolytes as indicated   Strict intake and output   INTAKE / OUTPUT:  Intake/Output Summary (Last 24 hours) at 06/20/16 1701 Last data filed at 06/20/16 1500  Gross per 24 hour  Intake          3232.65 ml  Output             2585 ml  Net           647.65 ml    GASTROINTESTINAL A:   S/P "duodenal switch" with chronic malabsorption and malnutrition with chronic edema.  P:   Continue tube feeds  Continue protonix for PUD prophylaxis   HEMATOLOGIC A:   Chronic anemia without acute blood loss  Thrombocytopenia  P:  Trend CBC Lovenox for VTE prophylaxis  Monitor for s/sx of bleeding  INFECTIOUS A:   ?HCAP Worsening  leukocytosis    P:   Trend WBC's and monitor fever curve Trend PCT's Continue empiric abx Follow cultures   Micro/culture results: MRSA pcr 4/20; negative BCx2 4/20>> negative UC 4/3>>negative  Sputum: 4/25; pending  Cdiff 04/20>>negative  Cdiff 04/26>> Procalcitonin 4/22; negative  Antibiotics: Zosyn 4/27>> Cefepime  4/20 >> 4/27  Vanc 4/20 >>4/22  ENDOCRINE A:   Hyperglycemia likely secondary to steroids  Possible adrenal insufficiency P:   Continue empiric steroids Continue SSI if pt remains hyperglycemic may need to start an insulin gtt    NEUROLOGIC A:   Acute metabolic/hepatic encephalopathy  P:   Maintain RASS goal 0 to -1 Continue propofol gtt to maintain RASS goal  Continue rifaximin and lactulose WUA daily    MAJOR EVENTS/TEST RESULTS: 04/24-Pt mechanically intubated   STUDIES:  CT Head 04/20>>No acute intracranial abnormalities. The appearance of the brain is normal. US Abdomen Limited 04/20>>No evidence of ascites Echo 04/20>>Ef 60% to 65%  Best Practices DVT Prophylaxis: enoxparin GI Prophylaxis: protonix   ---------------------------------------   Name: Shanece Carson MRN: [id number] DOB: 10/26/1960    ADMISSION DATE:  06-16-2016  SUBJECTIVE:  Noted events overnight. On 10 PEEP and 75% FiO2. Sister at bedside updated.   Review of Systems:  Unable to assess pt intubated    VITAL SIGNS: Temp:  [97.4 F (36.3 C)-98.1 F (36.7 C)] 97.7 F (36.5 C) (04/27 1600) Pulse Rate:  [79-104] 82 (04/27 1630) Resp:  [10-29] 15 (04/27 1630) BP: (79-108)/(37-75)  106/54 (04/27 1630) SpO2:  [89 %-99 %] 98 % (04/27 1630) FiO2 (%):  [75 %-100 %] 75 % (04/27 1200) Weight:  [241 lb 10 oz (109.6 kg)] 241 lb 10 oz (109.6 kg) (04/27 0451)  LABS: Reviewed   PHYSICAL EXAMINATION: Physical Examination:   VS: BP (!) 106/54   Pulse 82   Temp 97.7 F (36.5 C) (Oral)   Resp 15   Ht  (1.702 m) Comment: estimated  Wt 241 lb 10 oz (109.6 kg)    SpO2 98%   BMI 37.84 kg/m   General Appearance: chronically ill appearing Caucasian transgender female to female, mechanically intubated, NAD  Neuro: sedated not following commands, bilateral pupils unequal right 3 mm and left 2 mm HEENT: supple, no JVD, mild kyphosis  Pulmonary: diminished throughout, even, non labored, mechanically intubated   Cardiovascular: nsr, rrr, no M/R/G Abdomen: Benign, Soft, non-tender Skin:   warm, no rashes, no ecchymosis  Extremities: normal, no cyanosis, clubbing  RADIOLOGY:  Dg Abd 1 View  Result Date: 06/20/2016 CLINICAL DATA:  Abdominal distention EXAM: ABDOMEN - 1 VIEW COMPARISON:  June 16, 2016 FINDINGS: Nasogastric tube tip and side port are in the stomach. There are several loops of borderline dilated colon. There is no appreciable small bowel dilatation. No air-fluid levels or free air. There are surgical clips in the right abdomen. IMPRESSION: Suspect a degree of colonic ileus. No bowel obstruction evident. No free air. Nasogastric tube tip and side port in stomach. Electronically Signed   By: Bretta Bang III M.D.   On: 06/20/2016 10:20   Ct Head Wo Contrast  Result Date: 06/19/2016 CLINICAL DATA:  Acute hypoxia and hypercapnic respiratory failure secondary to fluid overload. Sepsis. EXAM: CT HEAD WITHOUT CONTRAST TECHNIQUE: Contiguous axial images were obtained from the base of the skull through the vertex without intravenous contrast. COMPARISON:  05/27/2016 CT FINDINGS: Brain: Wallace Cullens- white matter distinction is maintained without acute intracranial hemorrhage, midline shift or edema. Vascular: Mild-to-moderate bilateral carotid siphon calcifications. No hyperdense vessels. Skull: Normal. Negative for fracture or focal lesion. Sinuses/Orbits: No acute finding. Other: Partially visualized endotracheal and nasogastric tubes. IMPRESSION: No acute intracranial abnormality. New partially visualized endotracheal and nasogastric tubes are seen since prior  exam. Electronically Signed   By: Tollie Eth M.D.   On: 06/19/2016 01:50   Dg Chest Port 1 View  Result Date: 06/20/2016 CLINICAL DATA:  Respiratory failure . EXAM: PORTABLE CHEST 1 VIEW COMPARISON:  06/19/2016, 06/18/2016, 06/16/2016. FINDINGS: Endotracheal tube, NG tube, right PICC line stable position. Stable heart size. Persistent unchanged bilateral airspace disease. No pleural effusion or pneumothorax. Stable left lower rib fractures. IMPRESSION: 1. Lines and tubes in stable position. 2.  Persistent unchanged bilateral airspace disease. Electronically Signed   By: Maisie Fus  Register   On: 06/20/2016 07:01   Dg Chest Port 1 View  Result Date: 06/19/2016 CLINICAL DATA:  Respiratory failure. EXAM: PORTABLE CHEST 1 VIEW COMPARISON:  06/18/2016. FINDINGS: Endotracheal tube, right PICC line, NG tube in stable position. Heart size normal. Diffuse bilateral pulmonary infiltrates. No pleural effusion or pneumothorax. IMPRESSION: 1.  Lines and tubes in stable position. 2.  Bilateral airspace disease.  No change from prior exam. Electronically Signed   By: Maisie Fus  Register   On: 06/19/2016 06:41     Cc: 32 minutes  Dorothyann Gibbs MD PCCM  06/20/2016

## 2016-06-20 NOTE — Progress Notes (Signed)
Patient ID: [name], female   DOB: 11-13-1960, 56 y.o.   MRN: [id number]   [employer] PROGRESS NOTE  [name] DOB: [birth year] DOA: 2016-06-18 PCP: Pcp Not In System  HPI/Subjective: Patient intubated and off sedation but has not woken up yet. No response to sternal rub.  Objective: Vitals:   06/20/16 1200 06/20/16 1230  BP: (!) 81/49   Pulse: 79 81  Resp: 13 10  Temp: 97.4 F (36.3 C)     Filed Weights   06/18/16 0500 06/19/16 0500 06/20/16 0451  Weight: 111.4 kg (245 lb 9.5 oz) 104.7 kg (230 lb 13.2 oz) 109.6 kg (241 lb 10 oz)    ROS: Review of Systems  Unable to perform ROS: Critical illness   Exam: Physical Exam  Constitutional: She is intubated.  HENT:  Nose: No mucosal edema.  Eyes: Conjunctivae and lids are normal. Pupils are equal, round, and reactive to light.  Neck: Carotid bruit is not present. No thyromegaly present.  Cardiovascular: Regular rhythm, S1 normal, S2 normal and normal heart sounds.   Pulses:      Dorsalis pedis pulses are 0 on the left side.  Respiratory: She is intubated. She has decreased breath sounds in the right lower field and the left lower field. She has no wheezes. She has rhonchi in the right lower field and the left lower field. She has no rales.  GI: Bowel sounds are normal. She exhibits distension. There is no tenderness.  Musculoskeletal:       Left ankle: She exhibits swelling.  Lymphadenopathy:    She has cervical adenopathy.  Neurological:  Unresponsive to painful stimuli  Skin: Skin is warm.  Numerous decubitus ulcers  Psychiatric:  Intubated and sedated      Data Reviewed: Basic Metabolic Panel:  Recent Labs Lab 06/15/16 0438 06/16/16 0525 06/17/16 0531 06/18/16 0500  06/19/16 0251 06/19/16 0754 06/19/16 1644 06/20/16 0456 06/20/16 0935  NA 137 140 143 142  --   --  143  --  140 144  K 3.4* 3.8 4.2 3.7  < > 3.3* 2.8* 3.2* 3.4* 4.1  CL 112* 118* 118* 118*  --   --  116*  --  118*  121*  CO2 20* 21* 20* 21*  --   --  20*  --  20* 22  GLUCOSE 224* 199* 155* 251*  --   --  282*  --  481* 184*  BUN --   --  21*  --  26* 29*  CREATININE 1.33* 1.12* 0.91 1.01*  --   --  1.22*  --  1.58* 1.57*  CALCIUM 7.7* 7.9* 7.9* 8.2*  --   --  8.3*  --  7.4* 7.9*  MG 1.9 2.0 2.0  --   --   --  2.0  --  1.7  --   PHOS 2.4* 1.6* 2.3* 2.5  --   --  1.5* 2.7 3.2  --   < > = values in this interval not displayed. Liver Function Tests:  Recent Labs Lab 06/14/16 0420 06/15/16 0438 06/16/16 0525 06/20/16 0456  AST ALT ALKPHOS 125 96 103 76  BILITOT 1.4* 1.6* 1.1 0.9  PROT 6.0* 5.7* 5.8* 6.1*  ALBUMIN 2.8* 3.0* 2.7* 2.7*    Recent Labs Lab 06/15/16 0506 06/18/16 0500 06/19/16 0913  AMMONIA 74* 45* 54*   CBC:  Recent Labs Lab 06/16/16 0525 06/17/16 0531  06/18/16 0500 06/19/16 0615 06/20/16 0456  WBC 8.0 9.3 12.9* 24.2* 28.2*  NEUTROABS  --   --   --   --  25.1*  HGB 7.6* 7.9* 7.9* 7.8* 7.7*  HCT 23.1* 23.9* 24.4* 24.1* 25.4*  MCV 96.8 96.7 98.6 97.5 103.6*  PLT 147* 145* 154 140* 68*   Cardiac Enzymes:  Recent Labs Lab 06/14/16 0420  TROPONINI <0.03    CBG:  Recent Labs Lab 06/20/16 0756 06/20/16 0904 06/20/16 1004 06/20/16 1123 06/20/16 1226  GLUCAP 107* 143* 138* 154* 175*    Recent Results (from the past 240 hour(s))  Culture, blood (Routine x 2)     Status: None   Collection Time: 06/14/16  6:06 AM  Result Value Ref Range Status   Specimen Description BLOOD R FA  Final   Special Requests   Final    BOTTLES DRAWN AEROBIC AND ANAEROBIC Blood Culture adequate volume   Culture NO GROWTH 5 DAYS  Final   Report Status 06/18/2016 FINAL  Final  Culture, blood (Routine x 2)     Status: None   Collection Time: 2016-06-14  6:06 AM  Result Value Ref Range Status   Specimen Description BLOOD L FA  Final   Special Requests   Final    BOTTLES DRAWN AEROBIC AND ANAEROBIC Blood Culture adequate volume   Culture NO  GROWTH 5 DAYS  Final   Report Status 06/18/2016 FINAL  Final  MRSA PCR Screening     Status: None   Collection Time: Jun 14, 2016 11:45 AM  Result Value Ref Range Status   MRSA by PCR NEGATIVE NEGATIVE Final    Comment:        The GeneXpert MRSA Assay (FDA approved for NASAL specimens only), is one component of a comprehensive MRSA colonization surveillance program. It is not intended to diagnose MRSA infection nor to guide or monitor treatment for MRSA infections.   C difficile quick scan w PCR reflex     Status: None   Collection Time: 14-Jun-2016  1:45 PM  Result Value Ref Range Status   C Diff antigen NEGATIVE NEGATIVE Final   C Diff toxin NEGATIVE NEGATIVE Final   C Diff interpretation No C. difficile detected.  Final  Culture, respiratory (NON-Expectorated)     Status: None   Collection Time: 06/18/16  1:08 PM  Result Value Ref Range Status   Specimen Description TRACHEAL ASPIRATE  Final   Special Requests NONE  Final   Gram Stain   Final    ABUNDANT WBC PRESENT,BOTH PMN AND MONONUCLEAR NO ORGANISMS SEEN    Culture   Final    Consistent with normal respiratory flora. Performed at [facility], [address], [place], [place] [address]    Report Status 06/20/2016 FINAL  Final  C difficile quick scan w PCR reflex     Status: None   Collection Time: 06/19/16 10:07 AM  Result Value Ref Range Status   C Diff antigen NEGATIVE NEGATIVE Final   C Diff toxin NEGATIVE NEGATIVE Final   C Diff interpretation No C. difficile detected.  Final     Studies: Dg Abd 1 View  Result Date: 06/20/2016 CLINICAL DATA:  Abdominal distention EXAM: ABDOMEN - 1 VIEW COMPARISON:  June 16, 2016 FINDINGS: Nasogastric tube tip and side port are in the stomach. There are several loops of borderline dilated colon. There is no appreciable small bowel dilatation. No air-fluid levels or free air. There are surgical clips in the right abdomen. IMPRESSION:  Suspect a degree of colonic ileus. No bowel  obstruction evident. No free air. Nasogastric tube tip and side port in stomach. Electronically Signed   By: Bretta Bang III M.D.   On: 06/20/2016 10:20   Ct Head Wo Contrast  Result Date: 06/19/2016 CLINICAL DATA:  Acute hypoxia and hypercapnic respiratory failure secondary to fluid overload. Sepsis. EXAM: CT HEAD WITHOUT CONTRAST TECHNIQUE: Contiguous axial images were obtained from the base of the skull through the vertex without intravenous contrast. COMPARISON:  07-03-16 CT FINDINGS: Brain: Wallace Cullens- white matter distinction is maintained without acute intracranial hemorrhage, midline shift or edema. Vascular: Mild-to-moderate bilateral carotid siphon calcifications. No hyperdense vessels. Skull: Normal. Negative for fracture or focal lesion. Sinuses/Orbits: No acute finding. Other: Partially visualized endotracheal and nasogastric tubes. IMPRESSION: No acute intracranial abnormality. New partially visualized endotracheal and nasogastric tubes are seen since prior exam. Electronically Signed   By: Tollie Eth M.D.   On: 06/19/2016 01:50   Dg Chest Port 1 View  Result Date: 06/20/2016 CLINICAL DATA:  Respiratory failure . EXAM: PORTABLE CHEST 1 VIEW COMPARISON:  06/19/2016, 06/18/2016, 06/16/2016. FINDINGS: Endotracheal tube, NG tube, right PICC line stable position. Stable heart size. Persistent unchanged bilateral airspace disease. No pleural effusion or pneumothorax. Stable left lower rib fractures. IMPRESSION: 1. Lines and tubes in stable position. 2.  Persistent unchanged bilateral airspace disease. Electronically Signed   By: Maisie Fus  Register   On: 06/20/2016 07:01   Dg Chest Port 1 View  Result Date: 06/19/2016 CLINICAL DATA:  Respiratory failure. EXAM: PORTABLE CHEST 1 VIEW COMPARISON:  06/18/2016. FINDINGS: Endotracheal tube, right PICC line, NG tube in stable position. Heart size normal. Diffuse bilateral pulmonary infiltrates. No pleural effusion or pneumothorax. IMPRESSION: 1.  Lines  and tubes in stable position. 2.  Bilateral airspace disease.  No change from prior exam. Electronically Signed   By: Maisie Fus  Register   On: 06/19/2016 06:41    Scheduled Meds: . calamine  1 application Topical Q8H  . collagenase   Topical Daily  . enoxaparin (LOVENOX) injection  40 mg Subcutaneous Q24H  . fentaNYL (SUBLIMAZE) injection  100 mcg Intravenous Once  . hydrocortisone sod succinate (SOLU-CORTEF) inj  50 mg Intravenous Daily  . ipratropium-albuterol  3 mL Nebulization Q6H  . lactulose  30 g Per Tube BID  . levothyroxine  100 mcg Intravenous Daily  . mouth rinse  15 mL Mouth Rinse BID  . pantoprazole (PROTONIX) IV  40 mg Intravenous Q24H  . potassium chloride  20 mEq Per Tube TID  . rifaximin  550 mg Per Tube BID  . sodium chloride flush  10-40 mL Intracatheter Q12H  . Vitamin D (Ergocalciferol)  50,000 Units Per Tube Weekly   Continuous Infusions: . Marland KitchenTPN (CLINIMIX-E) Adult 40 mL/hr at 06/20/16 0800  . feeding supplement (VITAL AF 1.2 CAL) 1,000 mL (06/20/16 0800)  . fentaNYL infusion INTRAVENOUS Stopped (06/20/16 0930)  . insulin (NOVOLIN-R) infusion Stopped (06/20/16 1012)  . norepinephrine (LEVOPHED) Adult infusion 8 mcg/min (06/20/16 0800)  . piperacillin-tazobactam (ZOSYN)  IV      Assessment/Plan:  1. Septic shock secondary to healthcare associated pneumonia. Patient on Zosyn, Solu-Cortef and Levophed to maintain blood pressure. 2. Acute hypoxic respiratory failure. When I saw him today he was on the ventilator at 75% FiO2. 3. Acute diastolic congestive heart failure, anasarca and severe protein calorie malnutrition. Overall prognosis is poor. Appreciate palliative care consultation. 4. Acute hepatic encephalopathy and ascites. On lactulose and Xifaxan. Patient has not woken up once the  fentanyl stopped. 5. Acute kidney injury watch closely with diuresis 6. Numerous decubitus ulcers. Overall prognosis is poor 7. Hyponatremia. Sodium now in the normal  range 8. Hypokalemia. Electrolyte protocol in the ICU 9. Chronic anemia and thrombocytopenia 10. Colonic ileus and abdominal distention. Likely third spacing of fluids with the ascites.  Code Status:     Code Status Orders        Start     Ordered   06/19/16 0930  Do not attempt resuscitation (DNR)  Continuous    Question Answer Comment  In the event of cardiac or respiratory ARREST Do not call a "code blue"   In the event of cardiac or respiratory ARREST Do not perform Intubation, CPR, defibrillation or ACLS   In the event of cardiac or respiratory ARREST Use medication by any route, position, wound care, and other measures to relive pain and suffering. May use oxygen, suction and manual treatment of airway obstruction as needed for comfort.   Comments no re-intubation when extubated      06/19/16 0931    Code Status History    Date Active Date Inactive Code Status Order ID Comments User Context   05/26/2016  9:37 AM 06/19/2016  9:31 AM Full Code [id number]  Katharina Caper, MD ED   05/27/2016  8:38 PM 06/02/2016  7:52 PM Full Code [id number]  Enid Baas, MD Inpatient   04/28/2015  4:32 AM 05/02/2015 12:29 AM Full Code [id number]  Rodolph Bong, MD Inpatient   03/07/2014  9:05 PM 03/27/2014 10:36 PM Full Code [id number]  Barth Kirks Inpatient     Family Communication: Sister at the bedside when I rounded. She would like to see how things go over the next 24-48 hours and then decide with the next plan of action would be. Everything Levora Angel on the mental status because he would like a good quality of life. Disposition Plan: To be determined  Consultants:  Critical care specialist  Palliative care  Antibiotics:  Zosyn  Time spent: 25 minutes  Alford Highland  [employer]

## 2016-06-20 NOTE — Progress Notes (Signed)
Inpatient Diabetes Program Recommendations  AACE/ADA: New Consensus Statement on Inpatient Glycemic Control (2015)  Target Ranges:  Prepandial:   less than 140 mg/dL      Peak postprandial:   less than 180 mg/dL (1-2 hours)      Critically ill patients:  140 - 180 mg/dL   Lab Results  Component Value Date   GLUCAP 107 (H) 06/20/2016   HGBA1C 4.4 (L) 05/28/2016    Review of Glycemic Control  Results for [name], [name] (MRN [id number]) as of 06/20/2016 08:12  Ref. Range 06/20/2016 03:39 06/20/2016 04:48 06/20/2016 05:44 06/20/2016 06:52 06/20/2016 07:56  Glucose-Capillary Latest Ref Range: 65 - 99 mg/dL 409 (H) 811 (H) 914 (H) 109 (H) 107 (H)    Diabetes history:DM2 hx (no meds needed after bariatric surgery)  Outpatient Diabetes medications: None Current orders for Inpatient glycemic control: ICU Glycemic control order set phase 2  Inpatient Diabetes Program Recommendations: Patient can transition to ICU Glycemic control order set  phase 3 since patient meets criteria;  infusion rate is less than 4 units/hour, tube feeds are at goal and stable and 6 subsequent CBG readings are 180mg /dl.  Susette Racer, RN, BA, MHA, CDE Diabetes Coordinator Inpatient Diabetes Program  [phone] (Team Pager) [phone] [facility] Office) 06/20/2016 8:16 AM

## 2016-06-20 NOTE — Progress Notes (Signed)
Nutrition Follow-up  DOCUMENTATION CODES:   Severe malnutrition in context of chronic illness  INTERVENTION:   TPN: recommend increasing TPN to rate of 83 ml/hr with 20% Lipids for 20 ml/hr for 12 hours; provides 1874 kcals, 100 g of protein and 2232 mL of fluid. Meets 100% calorie needs, 74% protein needs. Pharmacy managing electrolytes, insulin drip for glucose management at present   NUTRITION DIAGNOSIS:   Malnutrition related to chronic illness as evidenced by severe depletion of body fat, severe depletion of muscle mass, severe fluid accumulation.  Continues but being addressed via nutrition support  GOAL:   Patient will meet greater than or equal to 90% of their needs  Progressing  MONITOR:   PO intake, Supplement acceptance, Labs, Weight trends  REASON FOR ASSESSMENT:   Consult New TPN/TNA  ASSESSMENT:   [name]  is a 56 y.o. female with a known history of Multiple medical problems including diabetes, diabetic neuropathy, iron deficiency anemia, status post gastric bypass, status post right BKA, peripheral vascular disease, recent admission for multiple rib fractures.  Pt remains on vent, levophed at 8 mcg/min, off diprivan. Fentanyl turned off this AM but pt remains unreposnive 5%AA/15%Dextrose infusing at rate of 40 ml/hr via PICC line Vital AF 1.2 infusing at rate of 15 ml/hr via NG tube UOP 1985 mL , stool output reduced via rectal tube with reduction in TF rate KUB with colonic ileus  Labs: reviewed Meds: insulin drip  Diet Order:  .TPN (CLINIMIX-E) Adult .TPN (CLINIMIX-E) Adult  Skin:  Wound (see comment) (ulceration on leg, stage III back post fall)  Last BM:  06/20/16  Height:   Ht Readings from Last 1 Encounters:  06/22/2016  (1.702 m)    Weight:   Wt Readings from Last 1 Encounters:  06/20/16 241 lb 10 oz (109.6 kg)    Ideal Body Weight:  67.2 kg  BMI:  Body mass index is 37.84 kg/m.  Estimated Nutritional Needs:   Kcal:   1225-1560 kcals (11-14 kcals/kg per ASPEN), 1892 kcals Modified [employer]  Protein:  >/= 135 g  Fluid:  >/= 2 L  EDUCATION NEEDS:   No education needs identified at this time  Allison Starcher MS, RD, LDN [phone] Pager  [phone] Weekend/On-Call Pager

## 2016-06-20 NOTE — Progress Notes (Signed)
Pharmacy Antibiotic Note  [name] is a 56 y.o. female admitted on 07-Jul-2016 with pneumonia.  Pharmacy has been consulted for Zosyn dosing. Vancomycin d/c 4/22.  Plan: Patient's coverage broadened to Zosyn for possible HCAP. Will continue Zosyn EI 3.375g IV Q8hr.   Height:  (170.2 cm) (estimated) Weight: 241 lb 10 oz (109.6 kg) IBW/kg (Calculated) : 66.1  Temp (24hrs), Avg:97.7 F (36.5 C), Min:97.4 F (36.3 C), Max:98.1 F (36.7 C)   Recent Labs Lab 06/16/16 0525 06/17/16 0531 06/18/16 0500 06/19/16 0615 06/19/16 0754 06/20/16 0456 06/20/16 0935  WBC 8.0 9.3 12.9* 24.2*  --  28.2*  --   CREATININE 1.12* 0.91 1.01*  --  1.22* 1.58* 1.57*    Estimated Creatinine Clearance: 52.3 mL/min (A) (by C-G formula based on SCr of 1.57 mg/dL (H)).    No Known Allergies  Antimicrobials this admission: cefepime  4/20 >>  4/26 Vancomycin 4/20 >>  4/22 Zosyn 4/27 >>  Dose adjustments this admission:    Microbiology results: 4/20 C diff: negative 4/20 BCx: no growth x 5 days  MRSA PCR: negative 4/25 Sputum: normal flora  4/26 Cdiff PCR: negative   Pharmacy will continue to monitor and adjust pre consult.   [name] L 06/20/2016 7:04 PM

## 2016-06-20 NOTE — Progress Notes (Signed)
MEDICATION RELATED CONSULT NOTE  Pharmacy Consult for TPN management   Pharmacy consulted for TPN management for 56 yo female requiring TPN due to tube feeding intolerance. Patient is has extensive medical history including gastric bypass, right BKA, and PVD.    Plan:   1. Will advance TPN to goal rate of Clinimix E 5/15 at 38mL/hr. Patient initiated on 20% lipids today.   2. Patient's potassium replaced VT TID x 3 doses. Will access electrolytes with am labs.   3. Per discussion with team, will continue insulin drip through the night due to TPN being advanced to goal rate. Will attempt to add insulin to TPN on 4/28.    No Known Allergies  Patient Measurements: Height:  (170.2 cm) (estimated) Weight: 241 lb 10 oz (109.6 kg) IBW/kg (Calculated) : 66.1  Vital Signs: Temp: 97.7 F (36.5 C) (04/27 1600) Temp Source: Oral (04/27 1600) BP: 80/49 (04/27 1800) Pulse Rate: 81 (04/27 1800) Intake/Output from previous day: 04/26 0701 - 04/27 0700 In: 3957.6 [I.V.:1191.5; NG/GT:1176.1; IV Piggyback:1590] Out: 2585 [Urine:1985; Stool:600] Intake/Output from this shift: No intake/output data recorded.  Labs:  Recent Labs  06/18/16 0500 06/19/16 0615 06/19/16 0754 06/19/16 1644 06/20/16 0456 06/20/16 0935  WBC 12.9* 24.2*  --   --  28.2*  --   HGB 7.9* 7.8*  --   --  7.7*  --   HCT 24.4* 24.1*  --   --  25.4*  --   PLT 154 140*  --   --  68*  --   CREATININE 1.01*  --  1.22*  --  1.58* 1.57*  MG  --   --  2.0  --  1.7  --   PHOS 2.5  --  1.5* 2.7 3.2  --   ALBUMIN  --   --   --   --  2.7*  --   PROT  --   --   --   --  6.1*  --   AST  --   --   --   --  22  --   ALT  --   --   --   --  15  --   ALKPHOS  --   --   --   --  76  --   BILITOT  --   --   --   --  0.9  --    Estimated Creatinine Clearance: 52.3 mL/min (A) (by C-G formula based on SCr of 1.57 mg/dL (H)).   Medical History: Past Medical History:  Diagnosis Date  . Anemia    iron deficient  .  Anxiety   . Chicken pox   . Chronic pain   . Depression   . DM type 2 (diabetes mellitus, type 2) (HCC) 2006   Essentially resolved following bariatric surgery, no meds  . History of malnutrition   . History of sleep apnea   . History of vitamin A deficiency   . HTN (hypertension)    History of; resolved following bariatric surgery  . Sleep apnea    no CPAP after losing weight  . Venous stasis    Pharmacy will continue to monitor and adjust per consult.   Enrigue Hashimi L 06/20/2016,7:09 PM

## 2016-06-21 LAB — GLUCOSE, CAPILLARY
GLUCOSE-CAPILLARY: 119 mg/dL — AB (ref 65–99)
GLUCOSE-CAPILLARY: 128 mg/dL — AB (ref 65–99)
GLUCOSE-CAPILLARY: 156 mg/dL — AB (ref 65–99)
GLUCOSE-CAPILLARY: 156 mg/dL — AB (ref 65–99)
GLUCOSE-CAPILLARY: 162 mg/dL — AB (ref 65–99)
Glucose-Capillary: 166 mg/dL — ABNORMAL HIGH (ref 65–99)
Glucose-Capillary: 147 mg/dL — ABNORMAL HIGH (ref 65–99)
Glucose-Capillary: 142 mg/dL — ABNORMAL HIGH (ref 65–99)
Glucose-Capillary: 128 mg/dL — ABNORMAL HIGH (ref 65–99)
Glucose-Capillary: 137 mg/dL — ABNORMAL HIGH (ref 65–99)
Glucose-Capillary: 128 mg/dL — ABNORMAL HIGH (ref 65–99)
Glucose-Capillary: 150 mg/dL — ABNORMAL HIGH (ref 65–99)

## 2016-06-21 LAB — PHOSPHORUS: PHOSPHORUS: 3.6 mg/dL (ref 2.5–4.6)

## 2016-06-21 LAB — BASIC METABOLIC PANEL
Anion gap: 2 — ABNORMAL LOW (ref 5–15)
BUN: 37 mg/dL — AB (ref 6–20)
CO2: 21 mmol/L — ABNORMAL LOW (ref 22–32)
Calcium: 8.5 mg/dL — ABNORMAL LOW (ref 8.9–10.3)
Chloride: 120 mmol/L — ABNORMAL HIGH (ref 101–111)
Creatinine, Ser: 2.19 mg/dL — ABNORMAL HIGH (ref 0.44–1.00)
GFR, EST AFRICAN AMERICAN: 28 mL/min — AB (ref >60)
GFR, EST NON AFRICAN AMERICAN: 24 mL/min — AB (ref >60)
Glucose, Bld: 135 mg/dL — ABNORMAL HIGH (ref 65–99)
POTASSIUM: 4.4 mmol/L (ref 3.5–5.1)
SODIUM: 143 mmol/L (ref 135–145)

## 2016-06-21 LAB — CBC
HEMATOCRIT: 25.1 % — AB (ref 35.0–47.0)
HEMOGLOBIN: 7.8 g/dL — AB (ref 12.0–16.0)
MCH: 32.2 pg (ref 26.0–34.0)
MCHC: 30.9 g/dL — AB (ref 32.0–36.0)
MCV: 104.1 fL — ABNORMAL HIGH (ref 80.0–100.0)
Platelets: 47 10*3/uL — ABNORMAL LOW (ref 150–440)
RBC: 2.41 MIL/uL — ABNORMAL LOW (ref 3.80–5.20)
RDW: 17.1 % — ABNORMAL HIGH (ref 11.5–14.5)
WBC: 26.3 10*3/uL — ABNORMAL HIGH (ref 3.6–11.0)

## 2016-06-21 LAB — MAGNESIUM: MAGNESIUM: 2.7 mg/dL — AB (ref 1.7–2.4)

## 2016-06-21 LAB — PROCALCITONIN: PROCALCITONIN: 2.22 ng/mL

## 2016-06-21 LAB — HEPARIN INDUCED PLATELET AB (HIT ANTIBODY): HEPARIN INDUCED PLT AB: 0.476 {OD_unit} — AB (ref 0.000–0.400)

## 2016-06-21 LAB — AMMONIA: Ammonia: 88 umol/L — ABNORMAL HIGH (ref 9–35)

## 2016-06-21 LAB — TRIGLYCERIDES: TRIGLYCERIDES: 61 mg/dL (ref <150)

## 2016-06-21 MED ORDER — ONDANSETRON HCL 4 MG/2ML IJ SOLN: 4.0000 mg | Freq: Four times a day (QID) | INTRAMUSCULAR | Status: DC | PRN

## 2016-06-21 MED ORDER — GLYCOPYRROLATE 1 MG PO TABS: 1.0000 mg | ORAL_TABLET | ORAL | Status: DC | PRN

## 2016-06-21 MED ORDER — MORPHINE BOLUS VIA INFUSION
1.0000 mg | INTRAVENOUS | Status: DC | PRN
  Filled 2016-06-21: qty 1

## 2016-06-21 MED ORDER — GLYCOPYRROLATE 0.2 MG/ML IJ SOLN: 0.2000 mg | INTRAMUSCULAR | Status: DC | PRN

## 2016-06-21 MED ORDER — POLYVINYL ALCOHOL 1.4 % OP SOLN
1.0000 [drp] | Freq: Four times a day (QID) | OPHTHALMIC | Status: DC | PRN
  Filled 2016-06-21: qty 15

## 2016-06-21 MED ORDER — ACETAMINOPHEN 325 MG PO TABS: 650.0000 mg | ORAL_TABLET | Freq: Four times a day (QID) | ORAL | Status: DC | PRN

## 2016-06-21 MED ORDER — BIOTENE DRY MOUTH MT LIQD: 15.0000 mL | OROMUCOSAL | Status: DC | PRN

## 2016-06-21 MED ORDER — ACETAMINOPHEN 650 MG RE SUPP: 650.0000 mg | Freq: Four times a day (QID) | RECTAL | Status: DC | PRN

## 2016-06-21 MED ORDER — ORAL CARE MOUTH RINSE
15.0000 mL | OROMUCOSAL | Status: DC
  Administered 2016-06-21 (×3): 15 mL via OROMUCOSAL

## 2016-06-21 MED ORDER — LORAZEPAM 2 MG/ML IJ SOLN: 1.0000 mg | INTRAMUSCULAR | Status: DC | PRN

## 2016-06-21 MED ORDER — SODIUM CHLORIDE 0.9 % IV SOLN
1.0000 mg/h | INTRAVENOUS | Status: DC
  Administered 2016-06-21: 1 mg/h via INTRAVENOUS
  Filled 2016-06-21: qty 10

## 2016-06-21 MED ORDER — PIPERACILLIN-TAZOBACTAM 3.375 G IVPB
3.3750 g | Freq: Two times a day (BID) | INTRAVENOUS | Status: DC
  Filled 2016-06-21 (×2): qty 50

## 2016-06-21 MED ORDER — SODIUM CHLORIDE 0.9% FLUSH: 3.0000 mL | INTRAVENOUS | Status: DC | PRN

## 2016-06-21 MED ORDER — MORPHINE SULFATE (CONCENTRATE) 10 MG/0.5ML PO SOLN: 5.0000 mg | ORAL | Status: DC | PRN

## 2016-06-21 MED ORDER — SODIUM CHLORIDE 0.9 % IV SOLN: 250.0000 mL | INTRAVENOUS | Status: DC | PRN

## 2016-06-21 MED ORDER — CHLORHEXIDINE GLUCONATE 0.12% ORAL RINSE (MEDLINE KIT)
15.0000 mL | Freq: Two times a day (BID) | OROMUCOSAL | Status: DC
  Administered 2016-06-21: 15 mL via OROMUCOSAL

## 2016-06-21 MED ORDER — DIPHENHYDRAMINE HCL 50 MG/ML IJ SOLN: 12.5000 mg | INTRAMUSCULAR | Status: DC | PRN

## 2016-06-21 MED ORDER — SODIUM CHLORIDE 0.9% FLUSH: 3.0000 mL | Freq: Two times a day (BID) | INTRAVENOUS | Status: DC

## 2016-06-21 MED ORDER — HALOPERIDOL LACTATE 2 MG/ML PO CONC
0.5000 mg | ORAL | Status: DC | PRN
  Filled 2016-06-21: qty 0.3

## 2016-06-21 MED ORDER — SODIUM CHLORIDE 0.9 % IV SOLN
12.5000 mg | Freq: Four times a day (QID) | INTRAVENOUS | Status: DC | PRN
  Filled 2016-06-21: qty 0.5

## 2016-06-21 MED ORDER — HALOPERIDOL 0.5 MG PO TABS
0.5000 mg | ORAL_TABLET | ORAL | Status: DC | PRN
  Filled 2016-06-21: qty 1

## 2016-06-21 MED ORDER — LORAZEPAM 1 MG PO TABS: 1.0000 mg | ORAL_TABLET | ORAL | Status: DC | PRN

## 2016-06-21 MED ORDER — LORAZEPAM 2 MG/ML PO CONC: 1.0000 mg | ORAL | Status: DC | PRN

## 2016-06-21 MED ORDER — ONDANSETRON 4 MG PO TBDP
4.0000 mg | ORAL_TABLET | Freq: Four times a day (QID) | ORAL | Status: DC | PRN
  Filled 2016-06-21: qty 1

## 2016-06-21 MED ORDER — HALOPERIDOL LACTATE 5 MG/ML IJ SOLN: 0.5000 mg | INTRAMUSCULAR | Status: DC | PRN

## 2016-06-23 ENCOUNTER — Telehealth: Payer: Self-pay | Admitting: Pulmonary Disease

## 2016-06-23 NOTE — Telephone Encounter (Signed)
Received Death Certificate From [employer] placed in nurse box.

## 2016-06-23 NOTE — Telephone Encounter (Signed)
[name] and [name] and informed death cert is ready for pick up. Placed up front. Nothing further needed.

## 2016-06-23 NOTE — Telephone Encounter (Signed)
Received death certificate and gave to DK to sign.

## 2016-06-24 NOTE — Progress Notes (Signed)
[facility] Critical Care Medicine Progess Note    SYNOPSIS   KevinWeltyis a 56 y.o.malewith a known history of Multiple medical problems including diabetes mellitus, depression, hypertension, sleep apnea, anemia, right BKA, severe malnutrition due to previous gastric bypass with multiple non-healing decub ulcers, chronically debilitated, going through gender reassignment. Now with possible sepsis, hepatic encephalopathy, and acute respiratory failure secondary to pulmonary edema and ?HCAP mechanically intubated 04/25.   ASSESSMENT/PLAN  ACUTE HYPOXIC HYPERCAPNIC RESPIRATORY FAILURE SUSPECTED PNEUMONIA SHOCK ? ETIOLOGY CIRCI ACUTE RENAL FAILURE ANEMIA/THROMBOCYTOPENIA TOXIC METABOLIC ENCEPHALOPATHY DNR  PLAN: Continue vent support at this time. Wean PEEP/FiO2 for sats >89%. On zosyn.  Appreciate nephrology input. Will discuss with sister about HD. On stress dose steroids. Wean vasopressors for MAP>65. No indication for transfusion at this time. DVT/GI/VAP prophylaxis. Awaiting family decision about goals of care.  Dorothyann Gibbs MD   SUBJECTIVE:  Noted events overnight. Decreased urine output overnight.   Review of Systems:  Unable to assess pt intubated    VITAL SIGNS: Temp:  [97.4 F (36.3 C)-97.9 F (36.6 C)] 97.8 F (36.6 C) (04/28 0800) Pulse Rate:  [79-91] 87 (04/28 0900) Resp:  [10-18] 13 (04/28 0900) BP: (80-106)/(37-75) 97/53 (04/28 0900) SpO2:  [92 %-99 %] 95 % (04/28 1113) FiO2 (%):  [70 %-75 %] 70 % (04/28 1113) Weight:  [248 lb 0.3 oz (112.5 kg)] 248 lb 0.3 oz (112.5 kg) (04/28 0500)  LABS: Reviewed   PHYSICAL EXAMINATION: Physical Examination:   VS: BP (!) 97/53   Pulse 87   Temp 97.8 F (36.6 C) (Axillary)   Resp 13   Ht  (1.702 m) Comment: estimated  Wt 248 lb 0.3 oz (112.5 kg)   SpO2 95%   BMI 38.85 kg/m    INTAKE / OUTPUT:  Intake/Output Summary (Last 24 hours) at 05/25/2016 1149 Last data filed at 06/05/2016 0900  Gross per  24 hour  Intake          2619.65 ml  Output               57 ml  Net          2562.65 ml   Vent Mode: PRVC FiO2 (%):  [70 %-75 %] 70 % Set Rate:  [15 bmp] 15 bmp Vt Set:  [500 mL] 500 mL PEEP:  [10 cmH20] 10 cmH20   General Appearance: chronically ill appearing Caucasian transgender female to female, mechanically intubated, NAD  Neuro: sedated not following commands, bilateral pupils unequal right 3 mm and left 2 mm HEENT: supple, no JVD, mild kyphosis  Pulmonary: diminished throughout, even, non labored, mechanically intubated   Cardiovascular: nsr, rrr, no M/R/G Abdomen: Benign, Soft, non-tender Skin:   warm, no rashes, no ecchymosis  Extremities: normal, no cyanosis, clubbing  RADIOLOGY:  Dg Abd 1 View  Result Date: 06/20/2016 CLINICAL DATA:  Abdominal distention EXAM: ABDOMEN - 1 VIEW COMPARISON:  June 16, 2016 FINDINGS: Nasogastric tube tip and side port are in the stomach. There are several loops of borderline dilated colon. There is no appreciable small bowel dilatation. No air-fluid levels or free air. There are surgical clips in the right abdomen. IMPRESSION: Suspect a degree of colonic ileus. No bowel obstruction evident. No free air. Nasogastric tube tip and side port in stomach. Electronically Signed   By: Bretta Bang III M.D.   On: 06/20/2016 10:20   Dg Chest Port 1 View  Result Date: 06/20/2016 CLINICAL DATA:  Respiratory failure . EXAM: PORTABLE CHEST 1 VIEW COMPARISON:  06/19/2016, 06/18/2016, 06/16/2016. FINDINGS: Endotracheal tube, NG tube, right PICC line stable position. Stable heart size. Persistent unchanged bilateral airspace disease. No pleural effusion or pneumothorax. Stable left lower rib fractures. IMPRESSION: 1. Lines and tubes in stable position. 2.  Persistent unchanged bilateral airspace disease. Electronically Signed   By: Maisie Fus  Register   On: 06/20/2016 07:01    Micro/culture results: MRSA pcr 4/20; negative BCx2 4/20>> negative UC  4/3>>negative  Sputum: 4/25; pending  Cdiff 04/20>>negative  Cdiff 04/26>> Procalcitonin 4/22; negative  Antibiotics: Zosyn 4/27>> Cefepime  4/20 >> 4/27  Vanc 4/20 >>4/22  Cc: 35 minutes  Dorothyann Gibbs MD PCCM  2016-06-28

## 2016-06-24 NOTE — Progress Notes (Signed)
Patient's family decided to make him comfort care at 1415 hours. Comfort measures protocol started and orders placed by Dr. Chales Abrahams. Morphine gtt was started prior to stopping Fentanyl gtt and extubation. Patient was apneic and pulseless at 1432 hours. Confirmed and pronounced by this Clinical research associate and Vassie Moselle, RN. Family was at the bedside. CN, Dr. Chales Abrahams, Dr. Hilton Sinclair, and CCMD notified.

## 2016-06-24 NOTE — Progress Notes (Signed)
Comfort care,extubated to 2lnc without complications

## 2016-06-24 NOTE — Progress Notes (Signed)
Discussed patient's condition and poor prognosis with family. They have decided to de-escalate care and keep the patient comfortable. Will initiate comfort measures.  Will notify primary team. Dorothyann Gibbs MD

## 2016-06-24 NOTE — Plan of Care (Signed)
Problem: Tissue Perfusion: Goal: Risk factors for ineffective tissue perfusion will decrease Outcome: Not Progressing Remains on Levephed drip to maintain MAP >65 and SPB >90.    Problem: Fluid Volume: Goal: Ability to maintain a balanced intake and output will improve Outcome: Not Progressing Urinary output poor.  Nephrology consult pending  Problem: Nutrition: Goal: Adequate nutrition will be maintained Outcome: Not Progressing Tube feeding remains at 15ml per hour.    Problem: Bowel/Gastric: Goal: Will not experience complications related to bowel motility Outcome: Not Progressing Flexiseal remains intact.  Low amount of stool noted.  Patient is on Lactulose ( for high ammonia levels)

## 2016-06-24 NOTE — Progress Notes (Signed)
Patient made comfort care as per family wishes. Asystole noted at 1632. ACLS protocol not followed 2 to DNR status. On exam pupils fixed and dilated. No dolls eye movements. No heart sounds heard on auscultation. Patient pronounced dead at 04-Jul-1630. Family at bedside.  Dorothyann Gibbs MD PCCM

## 2016-06-24 NOTE — Progress Notes (Signed)
Patient with blood sugars times 6 below 180.  Informed M. Luci Bank, NP.  Will continue insulin until drip is at 4 units per hour.

## 2016-06-24 NOTE — Progress Notes (Signed)
Patient ID: [name], female   DOB: February 25, 1960, 56 y.o.   MRN: [id number]    [facility] PROGRESS NOTE  [name] TOI:[id number] DOB: 15-Apr-1960 DOA: 06/15/2016 PCP: Pcp Not In System  HPI/Subjective: As per nursing staff no purposeful movement at this point. Patient intubated and looks like back on sedation.  Objective: Vitals:   07-07-16 1100 2016-07-07 1200  BP: (!) 89/51 (!) 96/52  Pulse: 83 83  Resp: 15 13  Temp:      Filed Weights   06/19/16 0500 06/20/16 0451 07-Jul-2016 0500  Weight: 104.7 kg (230 lb 13.2 oz) 109.6 kg (241 lb 10 oz) 112.5 kg (248 lb 0.3 oz)    ROS: Review of Systems  Unable to perform ROS: Critical illness   Exam: Physical Exam  Constitutional:  Patient intubated  HENT:  Nose: No mucosal edema.  Eyes: Conjunctivae and lids are normal. Pupils are equal, round, and reactive to light.  Neck: Carotid bruit is not present. No thyromegaly present.  Cardiovascular: Regular rhythm, S1 normal, S2 normal and normal heart sounds.   Pulses:      Dorsalis pedis pulses are 0 on the left side.  Respiratory: She has no wheezes. She has no rales.  Decreased breath sounds and rhonchi throughout lower lung fields bilaterally  GI: Bowel sounds are normal. There is no tenderness.  Abdominal distention and tightness  Musculoskeletal:  Left ankle swelling  Neurological:  Unresponsive to painful stimuli  Skin: Skin is warm.  Numerous decubitus ulcers  Psychiatric:  Intubated and sedated      Data Reviewed: Basic Metabolic Panel:  Recent Labs Lab 06/16/16 0525 06/17/16 0531 06/18/16 0500  06/19/16 0754 06/19/16 1644 06/20/16 0456 06/20/16 0935 Jul 07, 2016 0522  NA 140 143 142  --  143  --  140 144 143  K 3.8 4.2 3.7  < > 2.8* 3.2* 3.4* 4.1 4.4  CL 118* 118* 118*  --  116*  --  118* 121* 120*  CO2 21* 20* 21*  --  20*  --  20* 22 21*  GLUCOSE 199* 155* 251*  --  282*  --  481* 184* 135*  BUN 13 12 14   --  21*  --  26* 29* 37*  CREATININE 1.12*  0.91 1.01*  --  1.22*  --  1.58* 1.57* 2.19*  CALCIUM 7.9* 7.9* 8.2*  --  8.3*  --  7.4* 7.9* 8.5*  MG 2.0 2.0  --   --  2.0  --  1.7  --  2.7*  PHOS 1.6* 2.3* 2.5  --  1.5* 2.7 3.2  --  3.6  < > = values in this interval not displayed. Liver Function Tests:  Recent Labs Lab 06/15/16 0438 06/16/16 0525 06/20/16 0456  AST 21 19 22   ALT 18 18 15   ALKPHOS 96 103 76  BILITOT 1.6* 1.1 0.9  PROT 5.7* 5.8* 6.1*  ALBUMIN 3.0* 2.7* 2.7*    Recent Labs Lab 06/15/16 0506 06/18/16 0500 06/19/16 0913 07-07-16 0522  AMMONIA 74* 45* 54* 88*   CBC:  Recent Labs Lab 06/17/16 0531 06/18/16 0500 06/19/16 0615 06/20/16 0456 07-07-2016 0522  WBC 9.3 12.9* 24.2* 28.2* 26.3*  NEUTROABS  --   --   --  25.1*  --   HGB 7.9* 7.9* 7.8* 7.7* 7.8*  HCT 23.9* 24.4* 24.1* 25.4* 25.1*  MCV 96.7 98.6 97.5 103.6* 104.1*  PLT 145* 154 140* 68* 47*   CBG:  Recent Labs Lab 07/07/2016 0816 07-Jul-2016 0933 07-07-16  1031 2016/07/06 1148 07/06/2016 1256  GLUCAP 137* 128* 128* 156* 150*    Recent Results (from the past 240 hour(s))  Culture, blood (Routine x 2)     Status: None   Collection Time: 06/19/2016  6:06 AM  Result Value Ref Range Status   Specimen Description BLOOD R FA  Final   Special Requests   Final    BOTTLES DRAWN AEROBIC AND ANAEROBIC Blood Culture adequate volume   Culture NO GROWTH 5 DAYS  Final   Report Status 06/18/2016 FINAL  Final  Culture, blood (Routine x 2)     Status: None   Collection Time: 05/29/2016  6:06 AM  Result Value Ref Range Status   Specimen Description BLOOD L FA  Final   Special Requests   Final    BOTTLES DRAWN AEROBIC AND ANAEROBIC Blood Culture adequate volume   Culture NO GROWTH 5 DAYS  Final   Report Status 06/18/2016 FINAL  Final  MRSA PCR Screening     Status: None   Collection Time: 06/22/2016 11:45 AM  Result Value Ref Range Status   MRSA by PCR NEGATIVE NEGATIVE Final    Comment:        The GeneXpert MRSA Assay (FDA approved for NASAL  specimens only), is one component of a comprehensive MRSA colonization surveillance program. It is not intended to diagnose MRSA infection nor to guide or monitor treatment for MRSA infections.   C difficile quick scan w PCR reflex     Status: None   Collection Time: 06/17/2016  1:45 PM  Result Value Ref Range Status   C Diff antigen NEGATIVE NEGATIVE Final   C Diff toxin NEGATIVE NEGATIVE Final   C Diff interpretation No C. difficile detected.  Final  Culture, respiratory (NON-Expectorated)     Status: None   Collection Time: 06/18/16  1:08 PM  Result Value Ref Range Status   Specimen Description TRACHEAL ASPIRATE  Final   Special Requests NONE  Final   Gram Stain   Final    ABUNDANT WBC PRESENT,BOTH PMN AND MONONUCLEAR NO ORGANISMS SEEN    Culture   Final    Consistent with normal respiratory flora. Performed at [facility], [place] [address], [place], [place] [address]    Report Status 06/20/2016 FINAL  Final  C difficile quick scan w PCR reflex     Status: None   Collection Time: 06/19/16 10:07 AM  Result Value Ref Range Status   C Diff antigen NEGATIVE NEGATIVE Final   C Diff toxin NEGATIVE NEGATIVE Final   C Diff interpretation No C. difficile detected.  Final     Studies: Dg Abd 1 View  Result Date: 06/20/2016 CLINICAL DATA:  Abdominal distention EXAM: ABDOMEN - 1 VIEW COMPARISON:  June 16, 2016 FINDINGS: Nasogastric tube tip and side port are in the stomach. There are several loops of borderline dilated colon. There is no appreciable small bowel dilatation. No air-fluid levels or free air. There are surgical clips in the right abdomen. IMPRESSION: Suspect a degree of colonic ileus. No bowel obstruction evident. No free air. Nasogastric tube tip and side port in stomach. Electronically Signed   By: Lowella Grip III M.D.   On: 06/20/2016 10:20   Dg Chest Port 1 View  Result Date: 06/20/2016 CLINICAL DATA:  Respiratory failure . EXAM: PORTABLE CHEST 1 VIEW  COMPARISON:  06/19/2016, 06/18/2016, 06/16/2016. FINDINGS: Endotracheal tube, NG tube, right PICC line stable position. Stable heart size. Persistent unchanged bilateral airspace disease.  No pleural effusion or pneumothorax. Stable left lower rib fractures. IMPRESSION: 1. Lines and tubes in stable position. 2.  Persistent unchanged bilateral airspace disease. Electronically Signed   By: Marcello Moores  Register   On: 06/20/2016 07:01    Scheduled Meds: . calamine  1 application Topical T5V  . chlorhexidine gluconate (MEDLINE KIT)  15 mL Mouth Rinse BID  . collagenase   Topical Daily  . enoxaparin (LOVENOX) injection  40 mg Subcutaneous Q24H  . fentaNYL (SUBLIMAZE) injection  100 mcg Intravenous Once  . ipratropium-albuterol  3 mL Nebulization Q6H  . lactulose  30 g Per Tube BID  . levothyroxine  100 mcg Intravenous Daily  . mouth rinse  15 mL Mouth Rinse 10 times per day  . pantoprazole (PROTONIX) IV  40 mg Intravenous Q24H  . rifaximin  550 mg Per Tube BID  . sodium chloride flush  10-40 mL Intracatheter Q12H  . Vitamin D (Ergocalciferol)  50,000 Units Per Tube Weekly   Continuous Infusions: . Marland KitchenTPN (CLINIMIX-E) Adult 83 mL/hr at Jul 15, 2016 0900  . feeding supplement (VITAL AF 1.2 CAL) 1,000 mL (15-Jul-2016 0900)  . fentaNYL infusion INTRAVENOUS 200 mcg/hr (15-Jul-2016 1015)  . insulin (NOVOLIN-R) infusion 1.9 Units/hr (Jul 15, 2016 1258)  . norepinephrine (LEVOPHED) Adult infusion 12 mcg/min (2016-07-15 1111)  . piperacillin-tazobactam (ZOSYN)  IV      Assessment/Plan:  1. Septic shock With multiorgan failure secondary to healthcare associated pneumonia. Patient on Zosyn, Solu-Cortef and Levophed to maintain blood pressure. Overall prognosis is poor. Patient is a DO NOT RESUSCITATE. 2. Acute hypoxic respiratory failure. Patient on the ventilator at 70% FiO2. 3. Acute diastolic congestive heart failure, anasarca and severe protein calorie malnutrition. Overall prognosis is poor. Appreciate palliative care  consultation. 4. Acute hepatic encephalopathy and ascites. On lactulose and Xifaxan. Patient has not woken up once the fentanyl stopped. 5. Acute kidney injury. Worsening kidney function. Nephrology saw patient and would consider CRRT if aggressive management wanted by the family. 6. Numerous decubitus ulcers. Overall prognosis is poor 7. Hyponatremia. Sodium now in the normal range 8. Hypokalemia. Electrolyte protocol in the ICU 9. Chronic anemia and thrombocytopenia 10. Colonic ileus and abdominal distention. Likely third spacing of fluids with the ascites.  With my conversation yesterday with the sister family considering comfort care measures if they see no improvement. Nursing staff was waiting for family to come in today.  Code Status:     Code Status Orders        Start     Ordered   06/19/16 0930  Do not attempt resuscitation (DNR)  Continuous    Question Answer Comment  In the event of cardiac or respiratory ARREST Do not call a "code blue"   In the event of cardiac or respiratory ARREST Do not perform Intubation, CPR, defibrillation or ACLS   In the event of cardiac or respiratory ARREST Use medication by any route, position, wound care, and other measures to relive pain and suffering. May use oxygen, suction and manual treatment of airway obstruction as needed for comfort.   Comments no re-intubation when extubated      06/19/16 0931    Code Status History    Date Active Date Inactive Code Status Order ID Comments User Context   06/20/2016  9:37 AM 06/19/2016  9:31 AM Full Code [id number]  Theodoro Grist, MD ED   05/27/2016  8:38 PM 06/02/2016  7:52 PM Full Code [id number]  Gladstone Lighter, MD Inpatient   04/28/2015  4:32 AM 05/02/2015 12:29 AM  Full Code [id number]  Eugenie Filler, MD Inpatient   03/07/2014  9:05 PM 03/27/2014 10:36 PM Full Code [id number]  Tedra Coupe Inpatient     Family Communication: As per critical care specialist Disposition Plan: To be  determined  Consultants:  Critical care specialist  Palliative care  Antibiotics:  Zosyn  Time spent: 24 minutes  [place], [place]

## 2016-06-24 NOTE — Progress Notes (Signed)
Pharmacy Antibiotic Note  [name] is a 56 y.o. female admitted on 21-Jun-2016 with pneumonia.  Pharmacy has been consulted for Zosyn dosing. Vancomycin d/c 4/22.  Plan: Current orders for Zosyn 3.375gm IV Q8H  Patient with AKI - nephrology notes very little urine output in the past 24 hours and creatinine rising. Considering dialysis/CRRT pending goals of care determination.  Will dose for CrCl < 80ml/min due to AKI with Zosyn 3.375gm IV Q12H. Will continue to follow for renal replacement plans and adjust as indicated.   Height:  (170.2 cm) (estimated) Weight: 248 lb 0.3 oz (112.5 kg) IBW/kg (Calculated) : 66.1  Temp (24hrs), Avg:97.7 F (36.5 C), Min:97.4 F (36.3 C), Max:97.9 F (36.6 C)   Recent Labs Lab 06/17/16 0531 06/18/16 0500 06/19/16 0615 06/19/16 0754 06/20/16 0456 06/20/16 0935 06/06/2016 0522  WBC 9.3 12.9* 24.2*  --  28.2*  --  26.3*  CREATININE 0.91 1.01*  --  1.22* 1.58* 1.57* 2.19*    Estimated Creatinine Clearance: 38 mL/min (A) (by C-G formula based on SCr of 2.19 mg/dL (H)).    No Known Allergies  Antimicrobials this admission: cefepime  4/20 >>  4/26 Vancomycin 4/20 >>  4/22 Zosyn 4/27 >>  Dose adjustments this admission:    Microbiology results: 4/20 C diff: negative 4/20 BCx: no growth x 5 days  MRSA PCR: negative 4/25 Sputum: normal flora  4/26 Cdiff PCR: negative   Pharmacy will continue to monitor and adjust pre consult.   Sinan Tuch C 06/16/2016 9:55 AM

## 2016-06-24 NOTE — Consult Note (Signed)
[facility] CONSULT NOTE    Date: 2016/07/08                  Patient Name:  [name]  MRN: [id number]  DOB: [birth year]  Age / Sex: 56 y.o., female         PCP: Pcp Not In System                 Service Requesting Consult: Pulmonary critical care                 Reason for Consult: Acute renal failure            History of Present Illness: Patient is a 56 y.o. female with a PMHx of Obesity status post gastric bypass, history of sleep apnea, hypertension, venous stasis, malnutrition, gender change surgery, who was admitted to [facility] on 05/26/2016 for evaluation of worsening encephalopathy. Patient had recent admission for multiple rib fractures, urinary tract infection, and weakness. This admission the patient was significantly hypotensive with systolic blood pressure in the 80s. Patient is now developing worsening acute renal failure. Patient is on the ventilator and is on pressor therapy. Rate little urine output was noted over the preceding 24 hours. Patient is currently DO NOT RESUSCITATE status.  Hemoglobin is also quite low at 7.8 and patient has also developed thrombocytopenia.   Medications: Outpatient medications: Prescriptions Prior to Admission  Medication Sig Dispense Refill Last Dose  . acetaminophen (TYLENOL) 325 MG tablet Take 2 tablets (650 mg total) by mouth every 6 (six) hours as needed for mild pain (or Fever >/= 101).   PRN at PRN  . amLODipine (NORVASC) 2.5 MG tablet Take 2.5 mg by mouth daily.   06/12/2016 at 0900  . amoxicillin-clavulanate (AUGMENTIN) 875-125 MG tablet Take 1 tablet by mouth every 12 (twelve) hours. For cellulitis.   Unknown at Unknown  . aspirin EC 81 MG tablet Take 81 mg by mouth daily.   06/12/2016 at 0900  . busPIRone (BUSPAR) 15 MG tablet Take 15 mg by mouth 2 (two) times daily.   06/12/2016 at 2100  . calcium carbonate (OSCAL) 1500 (600 Ca) MG TABS tablet Take 600 mg of elemental calcium by mouth 2 (two) times daily with  a meal.   06/12/2016 at 2100  . citalopram (CELEXA) 10 MG tablet Take 10 mg by mouth daily. For depression   06/12/2016 at 0800  . collagenase (SANTYL) ointment Apply topically daily. 15 g 0 06/12/2016 at 2100  . donepezil (ARICEPT) 10 MG tablet Take 10 mg by mouth at bedtime.   06/12/2016 at 2100  . DULoxetine (CYMBALTA) 20 MG capsule Take 20 mg by mouth daily.    06/12/2016 at 0900  . ergocalciferol (VITAMIN D2) 50000 units capsule Take 50,000 Units by mouth once a week.   06/08/2016 at 0900  . ferrous sulfate 325 (65 FE) MG tablet Take 325 mg by mouth 2 (two) times daily.    06/12/2016 at 2100  . folic acid (FOLVITE) 1 MG tablet Take 1 mg by mouth daily.  05/25/2016 06/12/2016 at 0900  . levothyroxine (SYNTHROID, LEVOTHROID) 200 MCG tablet TAKE ONE TABLET BY MOUTH DAILY 90 tablet 1 06/12/2016 at 0600  . mirtazapine (REMERON) 7.5 MG tablet Take 7.5 mg by mouth at bedtime.    06/12/2016 at 2100  . morphine (MS CONTIN) 15 MG 12 hr tablet Take 1 tablet (15 mg total) by mouth every 12 (twelve) hours. For pain 60 tablet 0 06/12/2016 at UNKNOWN  .  Multiple Vitamins-Minerals (DECUBI-VITE PO) Take 1 tablet by mouth daily.   06/12/2016 at 0900  . omeprazole (PRILOSEC) 20 MG capsule Take 20 mg by mouth daily.   06/12/2016 at 0900  . [EXPIRED] oseltamivir (TAMIFLU) 75 MG capsule Take 75 mg by mouth daily.   06/12/2016 at 0900  . pantoprazole (PROTONIX) 40 MG tablet Take 40 mg by mouth daily. For GERD   06/12/2016 at 0600  . polyethylene glycol (MIRALAX / GLYCOLAX) packet Take 17 g by mouth daily as needed for mild constipation. 14 each 0 PRN at PRN  . potassium chloride SA (K-DUR,KLOR-CON) 20 MEQ tablet Take 20 mEq by mouth daily.   06/12/2016 at 0900  . simethicone (MYLICON) 80 MG chewable tablet Chew 1 tablet (80 mg total) by mouth every 6 (six) hours as needed for flatulence. 30 tablet 0 PRN at PRN  . TORSEMIDE PO Take 30 mg by mouth 2 (two) times daily. Hold for sbp < 110   06/12/2016 at 2100  . vitamin C (ASCORBIC ACID)  500 MG tablet Take 500 mg by mouth daily. FOR 30 DAYS   STOP DATE 06/04/15   06/12/2016 at 0900  . Amino Acids-Protein Hydrolys (FEEDING SUPPLEMENT, PRO-STAT SUGAR FREE 64,) LIQD Take 30 mLs by mouth 2 (two) times daily with a meal. Stop date 06/04/15   Taking  . ipratropium-albuterol (DUONEB) 0.5-2.5 (3) MG/3ML SOLN Take 3 mLs by nebulization every 6 (six) hours as needed. (Patient not taking: Reported on 06/03/2016) 360 mL 0 Not Taking at PRN  . oxyCODONE-acetaminophen (PERCOCET/ROXICET) 5-325 MG tablet Take 1 tablet by mouth every 4 (four) hours as needed for moderate pain or severe pain (Take 2 tablets every 4 hours as needed for severe pain). (Patient not taking: Reported on 05/27/2016) 30 tablet 0 Completed Course at Unknown time  . Pramoxine-Calamine (AVEENO ANTI-ITCH) 1-3 % CREA Apply 1 Bottle topically every 8 (eight) hours. (Patient not taking: Reported on 05/27/2016) 1 Tube 0 Not Taking at prn  . protein supplement shake (PREMIER PROTEIN) LIQD Take 325 mLs (11 oz total) by mouth 4 (four) times daily.  0     Current medications: Current Facility-Administered Medications  Medication Dose Route Frequency Provider Last Rate Last Dose  . Marland KitchenTPN (CLINIMIX-E) Adult   Intravenous Continuous TPN Loletha Grayer, MD 83 mL/hr at 06/20/16 1930    . calamine lotion 1 application  1 application Topical V7C Theodoro Grist, MD   1 application at 58/85/02 1400  . chlorhexidine gluconate (MEDLINE KIT) (PERIDEX) 0.12 % solution 15 mL  15 mL Mouth Rinse BID Mikael Spray, NP      . collagenase (SANTYL) ointment   Topical Daily Theodoro Grist, MD      . enoxaparin (LOVENOX) injection 40 mg  40 mg Subcutaneous Q24H Theodoro Grist, MD   40 mg at 06/20/16 2156  . feeding supplement (VITAL AF 1.2 CAL) liquid 1,000 mL  1,000 mL Per Tube Continuous Awilda Bill, NP 15 mL/hr at 2016/06/26 0240 1,000 mL at 2016-06-26 0240  . fentaNYL (SUBLIMAZE) bolus via infusion 50 mcg  50 mcg Intravenous Q1H PRN Awilda Bill, NP   50  mcg at 06/20/16 2220  . fentaNYL (SUBLIMAZE) injection 100 mcg  100 mcg Intravenous Once Laverle Hobby, MD      . fentaNYL 2536mg in NS 2557m(1069mml) infusion-PREMIX  25-400 mcg/hr Intravenous Continuous DanAwilda BillP 20 mL/hr at 06/20/16 2227 200 mcg/hr at 06/20/16 2227  . hydrocortisone sodium succinate (SOLU-CORTEF) 100 MG injection 50  mg  50 mg Intravenous Daily Laverle Hobby, MD   50 mg at 06/20/16 1042  . insulin regular (NOVOLIN R,HUMULIN R) 250 Units in sodium chloride 0.9 % 250 mL (1 Units/mL) infusion   Intravenous Continuous Awilda Bill, NP 3.8 mL/hr at 2016-07-19 0621 3.8 Units/hr at 2016/07/19 0621  . ipratropium-albuterol (DUONEB) 0.5-2.5 (3) MG/3ML nebulizer solution 3 mL  3 mL Nebulization Q4H PRN Wilhelmina Mcardle, MD   3 mL at 06/17/16 2353  . ipratropium-albuterol (DUONEB) 0.5-2.5 (3) MG/3ML nebulizer solution 3 mL  3 mL Nebulization Q6H Awilda Bill, NP   3 mL at Jul 19, 2016 0141  . lactulose (CHRONULAC) 10 GM/15ML solution 30 g  30 g Per Tube BID Dimas Chyle, MD   30 g at 06/20/16 2156  . levothyroxine (SYNTHROID, LEVOTHROID) injection 100 mcg  100 mcg Intravenous Daily Wilhelmina Mcardle, MD   100 mcg at 06/20/16 1058  . MEDLINE mouth rinse  15 mL Mouth Rinse 10 times per day Mikael Spray, NP      . midazolam (VERSED) injection 2 mg  2 mg Intravenous Q2H PRN Awilda Bill, NP   2 mg at 06/20/16 2049  . norepinephrine (LEVOPHED) 16 mg in dextrose 5 % 250 mL (0.064 mg/mL) infusion  0-40 mcg/min Intravenous Titrated Awilda Bill, NP 10.3 mL/hr at 06/20/16 1948 10.987 mcg/min at 06/20/16 1948  . ondansetron (ZOFRAN) injection 4 mg  4 mg Intravenous Q6H PRN Theodoro Grist, MD   4 mg at 06/16/16 2037  . pantoprazole (PROTONIX) injection 40 mg  40 mg Intravenous Q24H Wilhelmina Mcardle, MD   40 mg at 06/20/16 1414  . piperacillin-tazobactam (ZOSYN) IVPB 3.375 g  3.375 g Intravenous Q8H Loletha Grayer, MD 12.5 mL/hr at 2016/07/19 0524 3.375 g at 2016/07/19 0524  .  rifaximin (XIFAXAN) tablet 550 mg  550 mg Per Tube BID Mikael Spray, NP   550 mg at 06/20/16 2156  . sodium chloride flush (NS) 0.9 % injection 10-40 mL  10-40 mL Intracatheter Q12H Theodoro Grist, MD   10 mL at 06/20/16 2156  . sodium chloride flush (NS) 0.9 % injection 10-40 mL  10-40 mL Intracatheter PRN Theodoro Grist, MD   10 mL at 06/17/16 0926  . Vitamin D (Ergocalciferol) (DRISDOL) capsule 50,000 Units  50,000 Units Per Tube Weekly Wilhelmina Mcardle, MD          Allergies: No Known Allergies    Past Medical History: Past Medical History:  Diagnosis Date  . Anemia    iron deficient  . Anxiety   . Chicken pox   . Chronic pain   . Depression   . DM type 2 (diabetes mellitus, type 2) ([place]) 2006   Essentially resolved following bariatric surgery, no meds  . History of malnutrition   . History of sleep apnea   . History of vitamin A deficiency   . HTN (hypertension)    History of; resolved following bariatric surgery  . Sleep apnea    no CPAP after losing weight  . Venous stasis      Past Surgical History: Past Surgical History:  Procedure Laterality Date  . APPENDECTOMY  1991  . BELOW KNEE LEG AMPUTATION Right 2014  . CHOLECYSTECTOMY  1991  . Gastric sleeve, biliopancreatic diversion, duodenal switch  2006  . HERNIA REPAIR  2009  . severe malnutrition  2014  . TONSILLECTOMY AND ADENOIDECTOMY  1967     Family History: Family History  Problem Relation Age of  Onset  . Arthritis Mother   . Diabetes Mother      Social History: Social History   Social History  . Marital status: Single    Spouse name: N/A  . Number of children: N/A  . Years of education: N/A   Occupational History  . Not on file.   Social History Main Topics  . Smoking status: Never Smoker  . Smokeless tobacco: Never Used  . Alcohol use No  . Drug use: No  . Sexual activity: Not on file   Other Topics Concern  . Not on file   Social History Narrative   Lives by himself.    Uses a power wheel chair     Review of Systems: Patient cannot provide ROS due to being on the ventilator  Vital Signs: Blood pressure (!) 80/49, pulse 81, temperature 97.6 F (36.4 C), temperature source Oral, resp. rate 11, height _0  (1.702 m), weight 112.5 kg (248 lb 0.3 oz), SpO2 95 %.  Weight trends: Filed Weights   06/19/16 0500 06/20/16 0451 07-19-16 0500  Weight: 104.7 kg (230 lb 13.2 oz) 109.6 kg (241 lb 10 oz) 112.5 kg (248 lb 0.3 oz)    Physical Exam: General: Critically ill appearing  Head: Normocephalic, atraumatic.  Eyes: Periorbital edema noted  Nose: Mucous membranes moist, not inflammed, nonerythematous.  Throat: Endotracheal tube noted to be in place  Neck: Supple, trachea midline.  Lungs:  Coarse rhonchi bilateral, vent assisted  Heart: S1S2 no rubs  Abdomen:  Distended, firm, scant BS  Extremities: Edema in all 4 extremeties, 3+  Neurologic: Obtunded, not following commands  Skin: No visible rashes, scars.    Lab results: Basic Metabolic Panel:  Recent Labs Lab 06/16/16 0525 06/17/16 0531  06/19/16 0754 06/19/16 1644 06/20/16 0456 06/20/16 0935 07-19-16 0522  NA 140 143  < > 143  --  140 144 143  K 3.8 4.2  < > 2.8* 3.2* 3.4* 4.1 4.4  CL 118* 118*  < > 116*  --  118* 121* 120*  CO2 21* 20*  < > 20*  --  20* 22 21*  GLUCOSE 199* 155*  < > 282*  --  481* 184* 135*  BUN 13 12  < > 21*  --  26* 29* 37*  CREATININE 1.12* 0.91  < > 1.22*  --  1.58* 1.57* 2.19*  CALCIUM 7.9* 7.9*  < > 8.3*  --  7.4* 7.9* 8.5*  MG 2.0 2.0  --  2.0  --  1.7  --  2.7*  PHOS 1.6* 2.3*  < > 1.5* 2.7 3.2  --  3.6  < > = values in this interval not displayed.  Liver Function Tests:  Recent Labs Lab 06/15/16 0438 06/16/16 0525 06/20/16 0456  AST _1 ALT _2 ALKPHOS 96 103 76  BILITOT 1.6* 1.1 0.9  PROT 5.7* 5.8* 6.1*  ALBUMIN 3.0* 2.7* 2.7*   No results for input(s): LIPASE, AMYLASE in the last 168 hours.  Recent Labs Lab 06/18/16 0500  06/19/16 0913 2016/07/19 0522  AMMONIA 45* 54* 88*    CBC:  Recent Labs Lab 06/17/16 0531 06/18/16 0500 06/19/16 0615 06/20/16 0456 2016/07/19 0522  WBC 9.3 12.9* 24.2* 28.2* 26.3*  NEUTROABS  --   --   --  25.1*  --   HGB 7.9* 7.9* 7.8* 7.7* 7.8*  HCT 23.9* 24.4* 24.1* 25.4* 25.1*  MCV 96.7 98.6 97.5 103.6* 104.1*  PLT 145* 154 140* 68*  47*    Cardiac Enzymes: No results for input(s): CKTOTAL, CKMB, CKMBINDEX, TROPONINI in the last 168 hours.  BNP: Invalid input(s): POCBNP  CBG:  Recent Labs Lab Jul 03, 2016 0205 07/03/2016 0312 03-Jul-2016 0412 2016/07/03 0517 03-Jul-2016 0619  GLUCAP 156* 166* 147* 142* 119*    Microbiology: Results for orders placed or performed during the hospital encounter of 06/12/2016  Culture, blood (Routine x 2)     Status: None   Collection Time: 06/05/2016  6:06 AM  Result Value Ref Range Status   Specimen Description BLOOD R FA  Final   Special Requests   Final    BOTTLES DRAWN AEROBIC AND ANAEROBIC Blood Culture adequate volume   Culture NO GROWTH 5 DAYS  Final   Report Status 06/18/2016 FINAL  Final  Culture, blood (Routine x 2)     Status: None   Collection Time: 06/20/2016  6:06 AM  Result Value Ref Range Status   Specimen Description BLOOD L FA  Final   Special Requests   Final    BOTTLES DRAWN AEROBIC AND ANAEROBIC Blood Culture adequate volume   Culture NO GROWTH 5 DAYS  Final   Report Status 06/18/2016 FINAL  Final  MRSA PCR Screening     Status: None   Collection Time: 06/18/2016 11:45 AM  Result Value Ref Range Status   MRSA by PCR NEGATIVE NEGATIVE Final    Comment:        The GeneXpert MRSA Assay (FDA approved for NASAL specimens only), is one component of a comprehensive MRSA colonization surveillance program. It is not intended to diagnose MRSA infection nor to guide or monitor treatment for MRSA infections.   C difficile quick scan w PCR reflex     Status: None   Collection Time: 06/18/2016  1:45 PM  Result Value Ref Range  Status   C Diff antigen NEGATIVE NEGATIVE Final   C Diff toxin NEGATIVE NEGATIVE Final   C Diff interpretation No C. difficile detected.  Final  Culture, respiratory (NON-Expectorated)     Status: None   Collection Time: 06/18/16  1:08 PM  Result Value Ref Range Status   Specimen Description TRACHEAL ASPIRATE  Final   Special Requests NONE  Final   Gram Stain   Final    ABUNDANT WBC PRESENT,BOTH PMN AND MONONUCLEAR NO ORGANISMS SEEN    Culture   Final    Consistent with normal respiratory flora. Performed at [facility], [place] [address], [place], [place] [address]    Report Status 06/20/2016 FINAL  Final  C difficile quick scan w PCR reflex     Status: None   Collection Time: 06/19/16 10:07 AM  Result Value Ref Range Status   C Diff antigen NEGATIVE NEGATIVE Final   C Diff toxin NEGATIVE NEGATIVE Final   C Diff interpretation No C. difficile detected.  Final    Coagulation Studies: No results for input(s): LABPROT, INR in the last 72 hours.  Urinalysis: No results for input(s): COLORURINE, LABSPEC, PHURINE, GLUCOSEU, HGBUR, BILIRUBINUR, KETONESUR, PROTEINUR, UROBILINOGEN, NITRITE, LEUKOCYTESUR in the last 72 hours.  Invalid input(s): APPERANCEUR    Imaging: Dg Abd 1 View  Result Date: 06/20/2016 CLINICAL DATA:  Abdominal distention EXAM: ABDOMEN - 1 VIEW COMPARISON:  June 16, 2016 FINDINGS: Nasogastric tube tip and side port are in the stomach. There are several loops of borderline dilated colon. There is no appreciable small bowel dilatation. No air-fluid levels or free air. There are surgical clips in the right abdomen. IMPRESSION:  Suspect a degree of colonic ileus. No bowel obstruction evident. No free air. Nasogastric tube tip and side port in stomach. Electronically Signed   By: Lowella Grip III M.D.   On: 06/20/2016 10:20   Dg Chest Port 1 View  Result Date: 06/20/2016 CLINICAL DATA:  Respiratory failure . EXAM: PORTABLE CHEST 1 VIEW COMPARISON:   06/19/2016, 06/18/2016, 06/16/2016. FINDINGS: Endotracheal tube, NG tube, right PICC line stable position. Stable heart size. Persistent unchanged bilateral airspace disease. No pleural effusion or pneumothorax. Stable left lower rib fractures. IMPRESSION: 1. Lines and tubes in stable position. 2.  Persistent unchanged bilateral airspace disease. Electronically Signed   By: Marcello Moores  Register   On: 06/20/2016 07:01      Assessment & Plan: Pt is a 56 y.o. female (originally born a female) with a PMHx of Obesity status post gastric bypass, history of sleep apnea, hypertension, venous stasis, malnutrition, gender change surgery, who was admitted to [facility] on 06/12/2016 for evaluation of worsening encephalopathy.   1.  Acute renal failure due to concurrent illness, hypotension, sepsis. 2.  Hypotension, on pressors. 3.  Acute respiratory failure. 4.  Generalized edema due to 3rd spacing of fluid, low albumin 5.  Anemia unspecified.  Plan:  At this point in time patient appears to be severely ill. Patient has evidence of multiorgan failure. Patient is now also experienced oliguric acute renal failure. Patient is noted to be DO NOT RESUSCITATE status. We have been asked to see the patient by pulmonary/critical care. Dialysis certainly could be offered in this situation however we would recommend clarifying goals of care with the family before proceeding with this.  I discussed the case with pulmonary/critical care NP, Harless Nakayama.  The day CCU team will clarify with family whether they want to proceed renal replacement therapy.  If family does request this then we would recommend temporary dialysis catheter placement and we can proceed with CRRT.  Will await further input from CCU team.  Thanks for consult.

## 2016-06-24 DEATH — deceased

## 2016-07-08 ENCOUNTER — Encounter: Payer: 59 | Admitting: Vascular Surgery

## 2016-07-22 ENCOUNTER — Telehealth: Payer: Self-pay | Admitting: Internal Medicine

## 2016-07-22 NOTE — Telephone Encounter (Signed)
Returned call to patient's sister for more information since patient has not been seen in clinic. She states insurance forms were sent last week. I told her we have not received forms and asked if she could re-fax. She says she would and fax number was verified. Receipt of forms still pending 30 mins later.

## 2016-07-22 NOTE — Telephone Encounter (Signed)
Pt sisiter called regarding paperwork that was faxed on 5/22. Please call.

## 2016-07-22 NOTE — Telephone Encounter (Signed)
Called [name] and let her know as of 4:39 we have still not received forms. She will attempt to resend if all else fails she will hand deliver tomorrow.

## 2016-07-23 NOTE — Telephone Encounter (Signed)
Sister [name] dropped off forms from [employer]  .  Signed ROI Check for $25 fee and proof of Legal representative.    Please send forms back to sister so she may complete request and send to [employer] with additional forms.  If unable to complete in office please send to [employer].  If md completing here please shred check and touch base with sister

## 2016-07-23 NOTE — Telephone Encounter (Signed)
Forms have been placed in provider's folder for direction of whether he completed or [employer].

## 2016-07-25 NOTE — Telephone Encounter (Signed)
Forms completed and faxed back per instruction. Nothing further needed

## 2016-08-24 NOTE — Discharge Summary (Signed)
Admission Diagnosis Acute Hepatic encephalopathy Anasarca Acute renal failure Hyponatremia   Discharge Diagnosis ACUTE HYPOXIC HYPERCAPNIC RESPIRATORY FAILURE SUSPECTED PNEUMONIA SHOCK ? ETIOLOGY CIRCI ACUTE RENAL FAILURE ANEMIA/THROMBOCYTOPENIA TOXIC METABOLIC ENCEPHALOPATHY DNR  Hospital Course 56 y.o.malewith multiple medical problems including diabetes mellitus, depression, hypertension, sleep apnea, anemia, right BKA, severe malnutrition due to previous gastric bypass with multiple non-healing decub ulcers, chronically debilitated, going through gender reassignment. Admitted to the hospital with altered mental status ans intubated for health care associated pneumonia. No significant improvement with worsening urine output and renal failure. Family decided to make patient comfortable and de-escalate care. Patient extubated as per family wishes.Patient passed away at 1632. Family at bedside.  Disposition : Morgue

## 2017-12-28 IMAGING — CR DG CHEST 2V
2 series · 2 of 2 positions shown · non-contrast
Comparison: 05/31/2016

CLINICAL DATA: Altered mental status. Repetitive speech.
Hypotension. Coming in and out of consciousness. History of
diabetes, hypertension, and sleep apnea.

EXAM:
CHEST  2 VIEW

[chest lat]
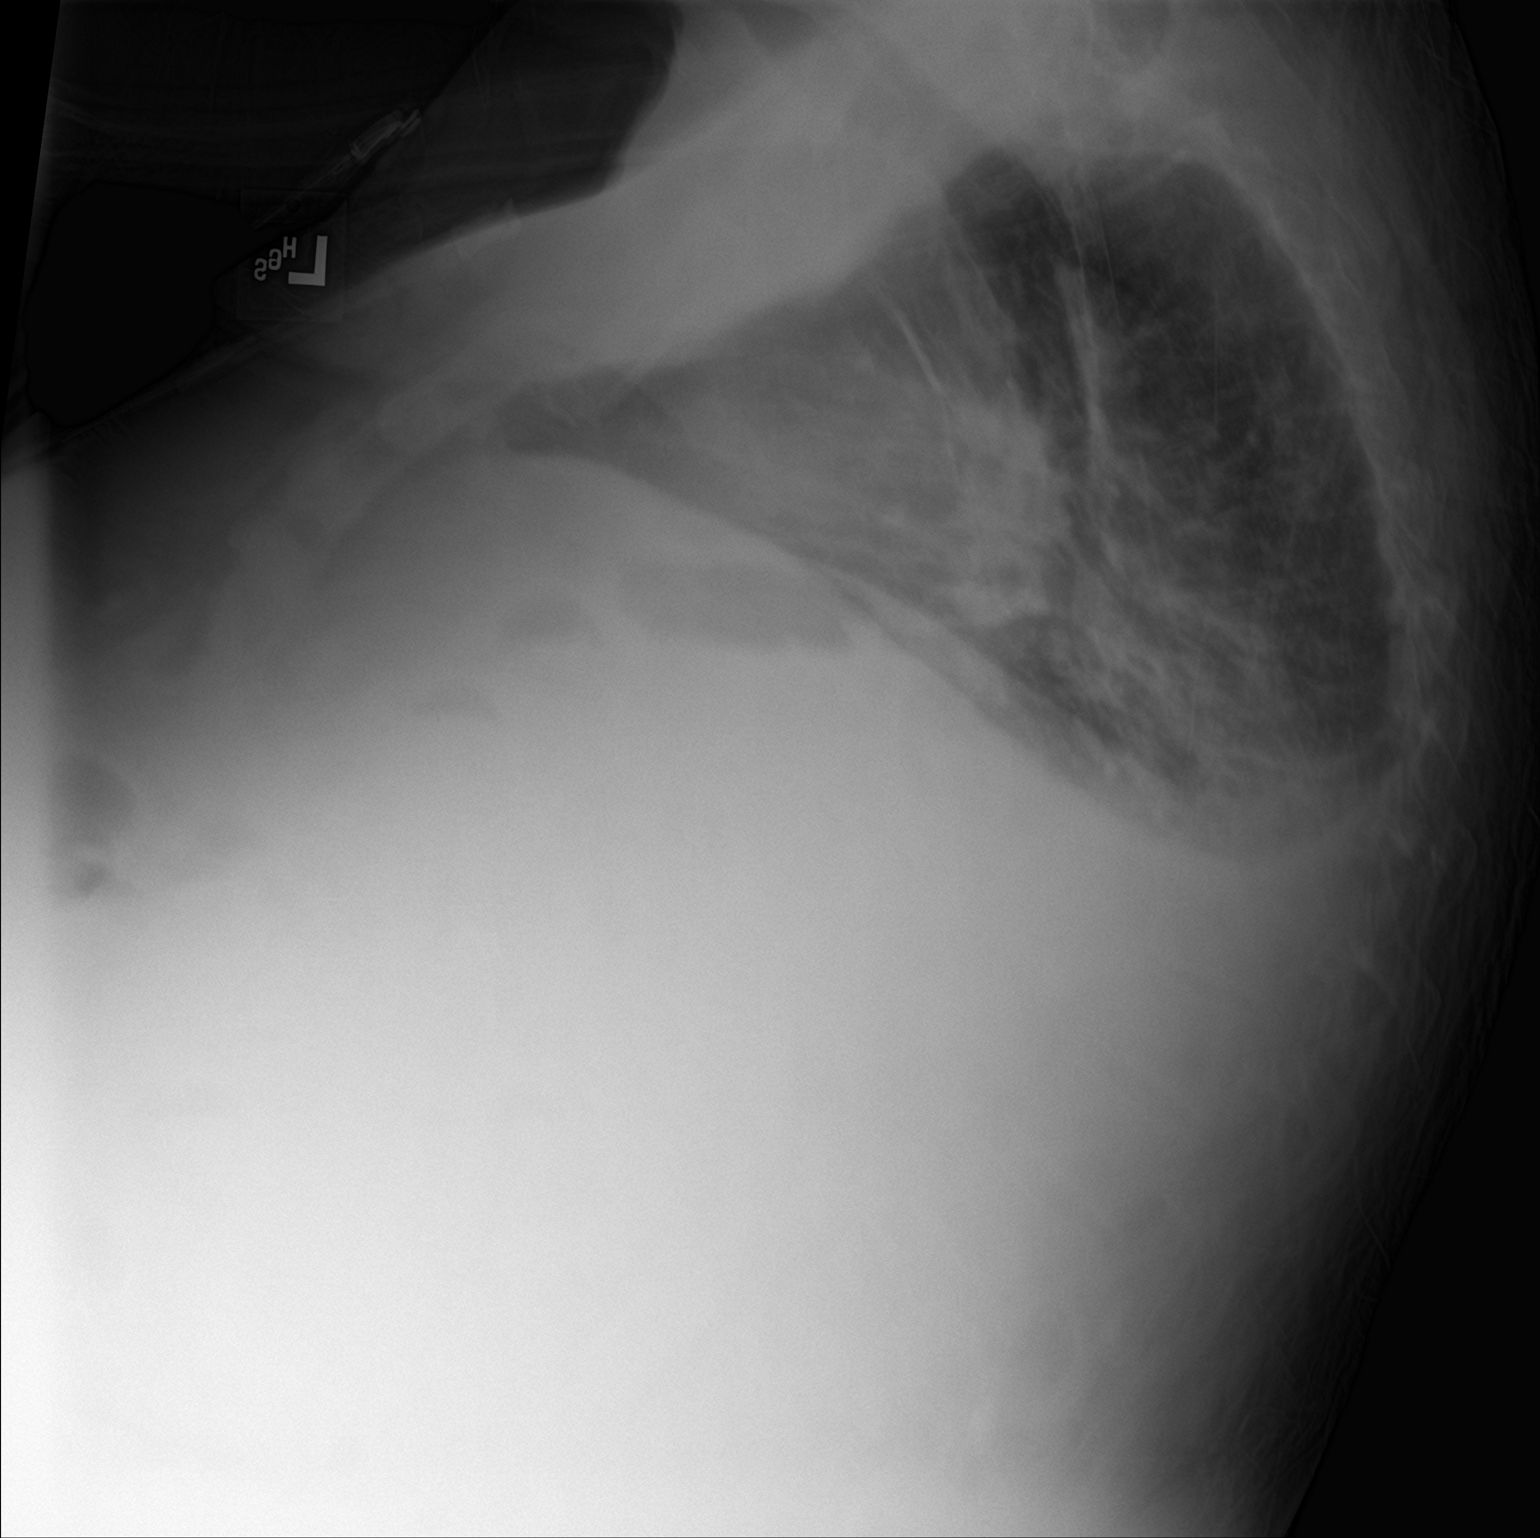

[chest ap]
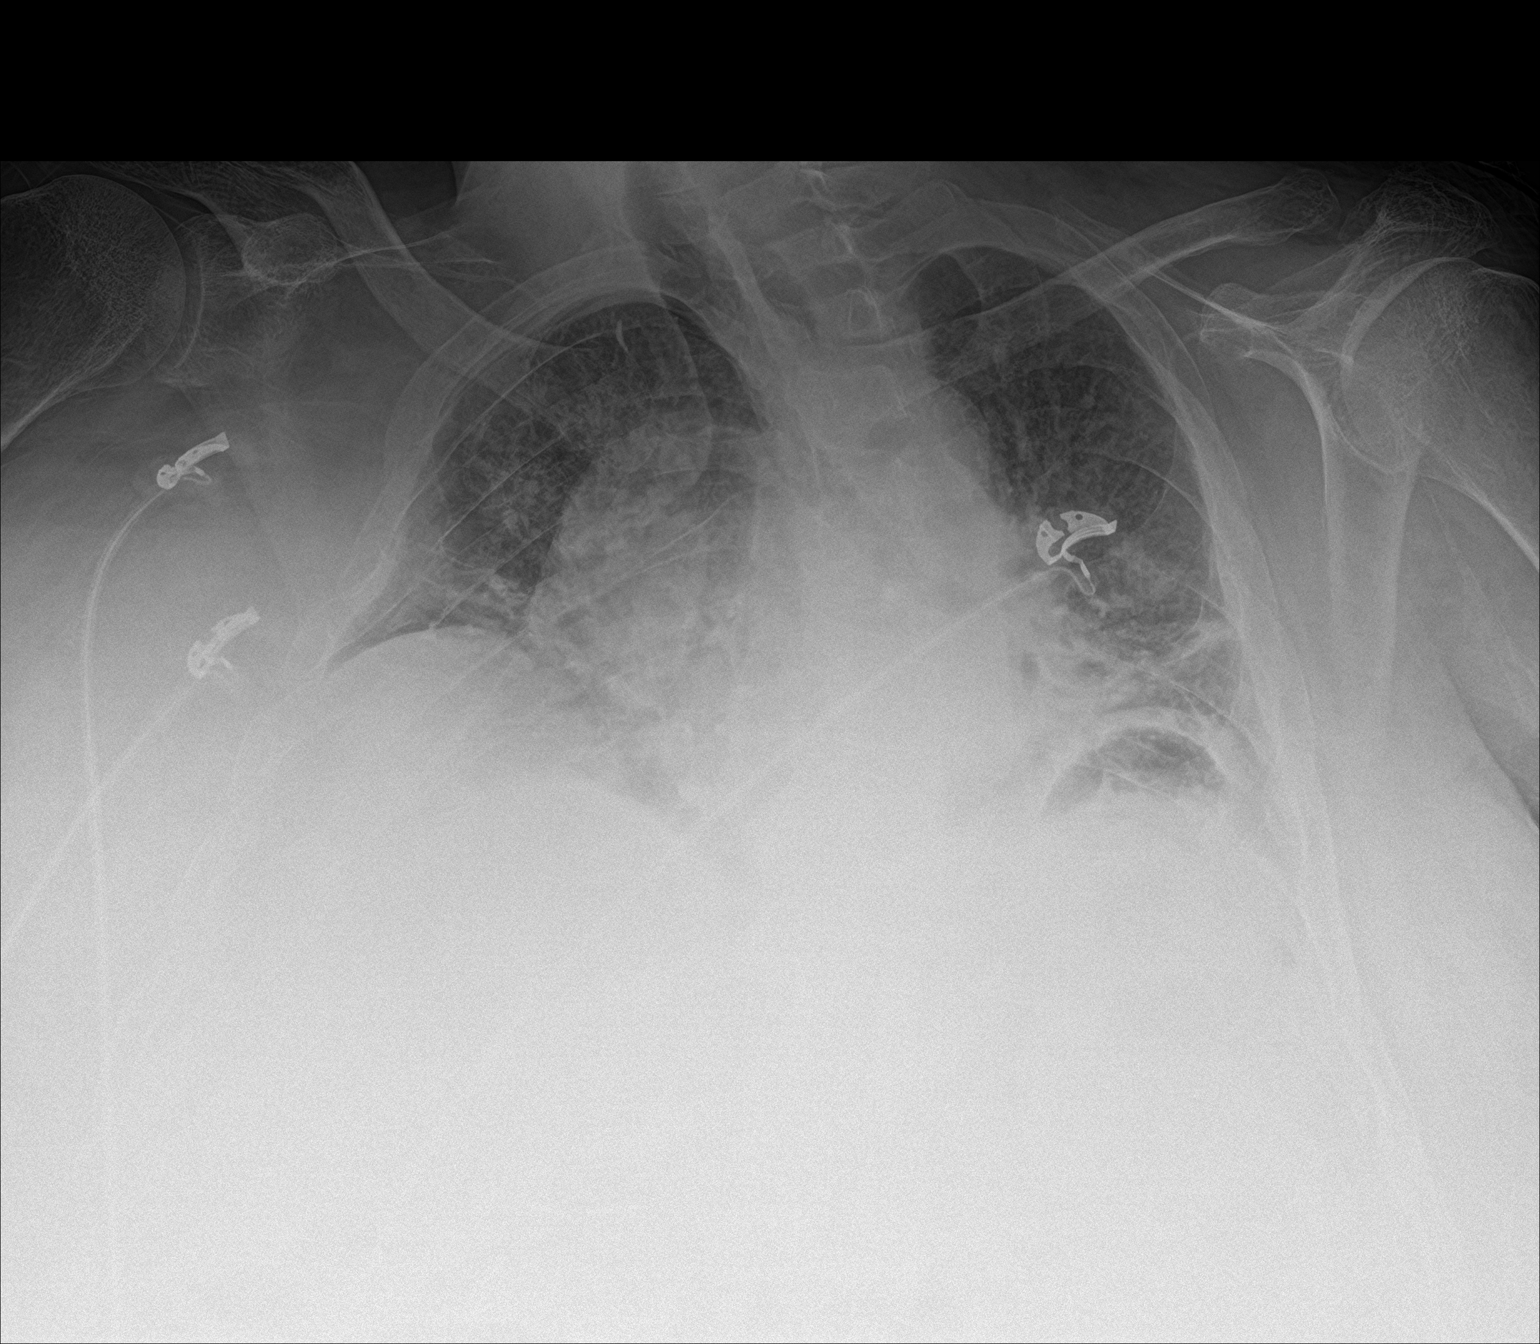

[2 of 2 positions shown; findings below may reference images not displayed]

FINDINGS: Shallow inspiration with linear atelectasis or infiltration in the
lung bases similar prior study. Small bilateral pleural effusions.
Mild cardiac enlargement. Pulmonary vascularity is normal for
technique. Old bilateral rib fractures.
IMPRESSION: Shallow inspiration with atelectasis or infiltration in the lung
bases and small pleural effusions bilaterally.

## 2017-12-31 IMAGING — DX DG CHEST 1V PORT
1 series · 2 of 2 positions shown · non-contrast
Comparison: 06/14/2016

CLINICAL DATA: Respiratory failure

EXAM:
PORTABLE CHEST 1 VIEW

[Series 1: chest ap · 0.14mm/px · 2 of 2 slices shown]
[im 1/2]
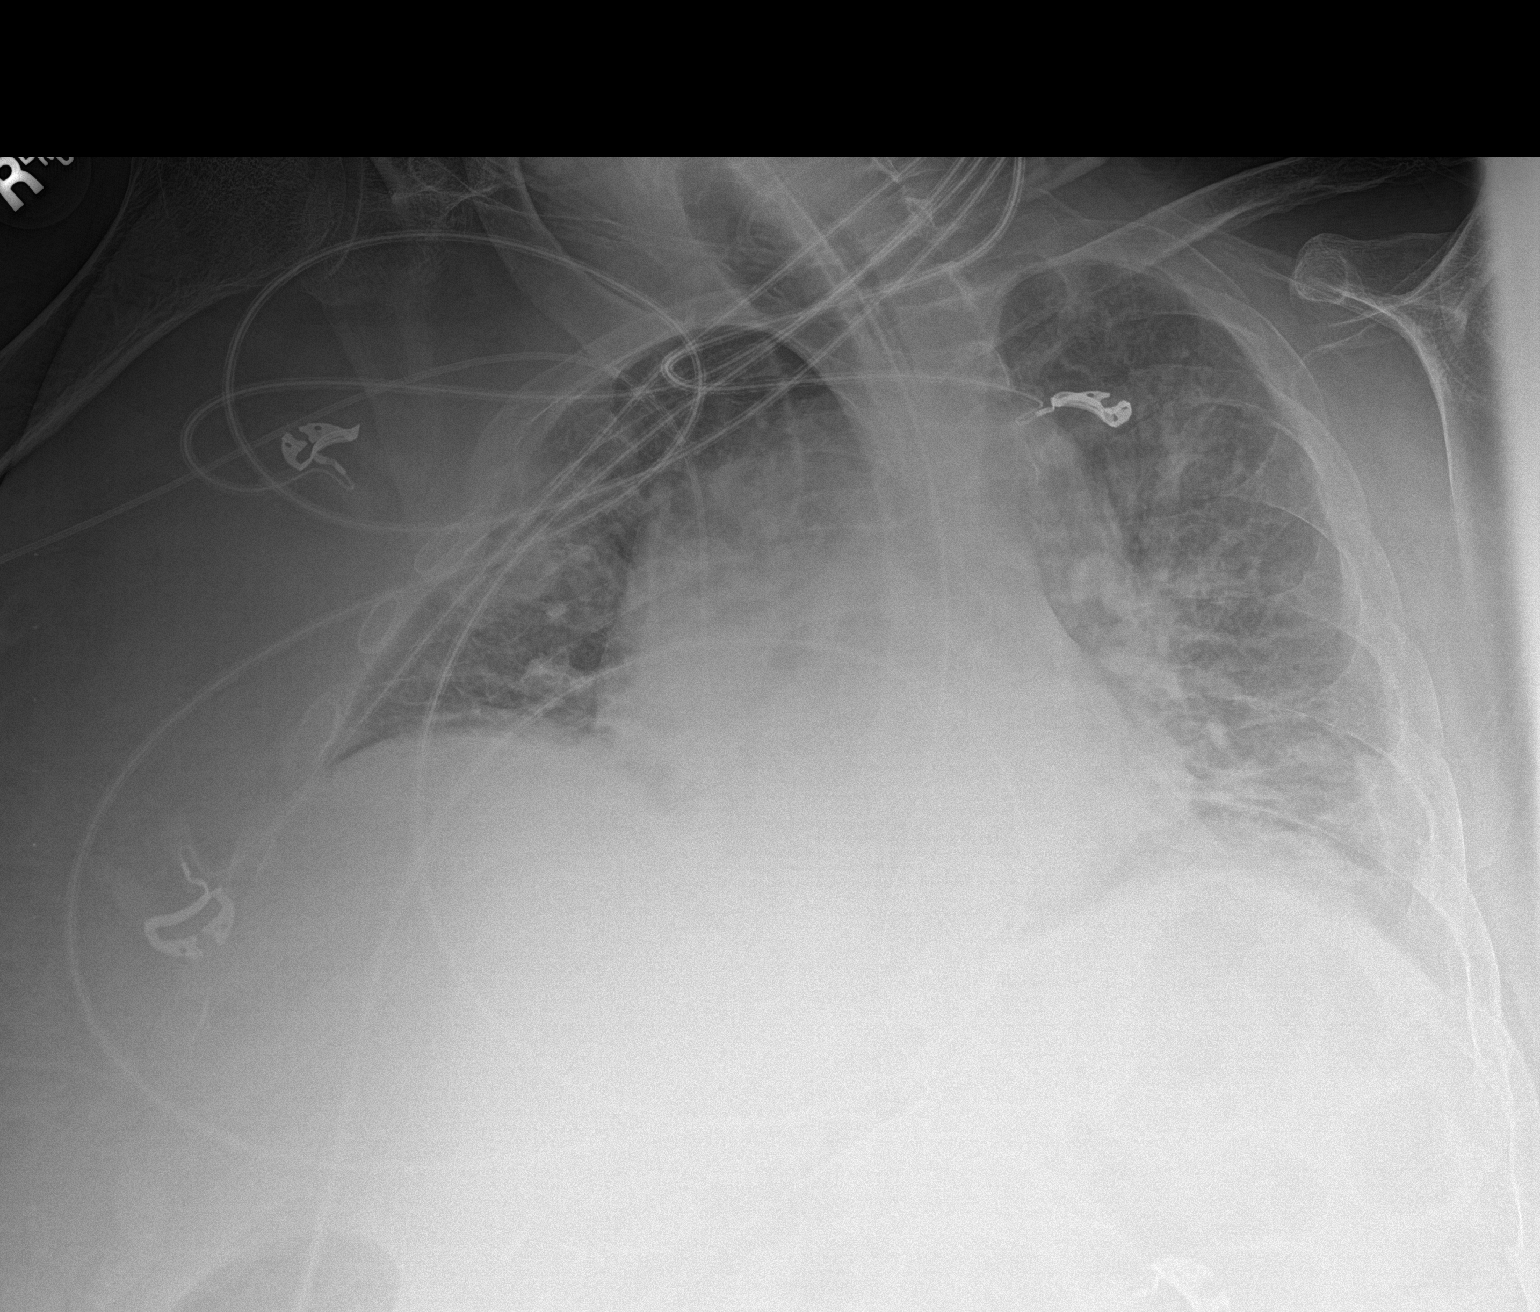
[im 2/2]
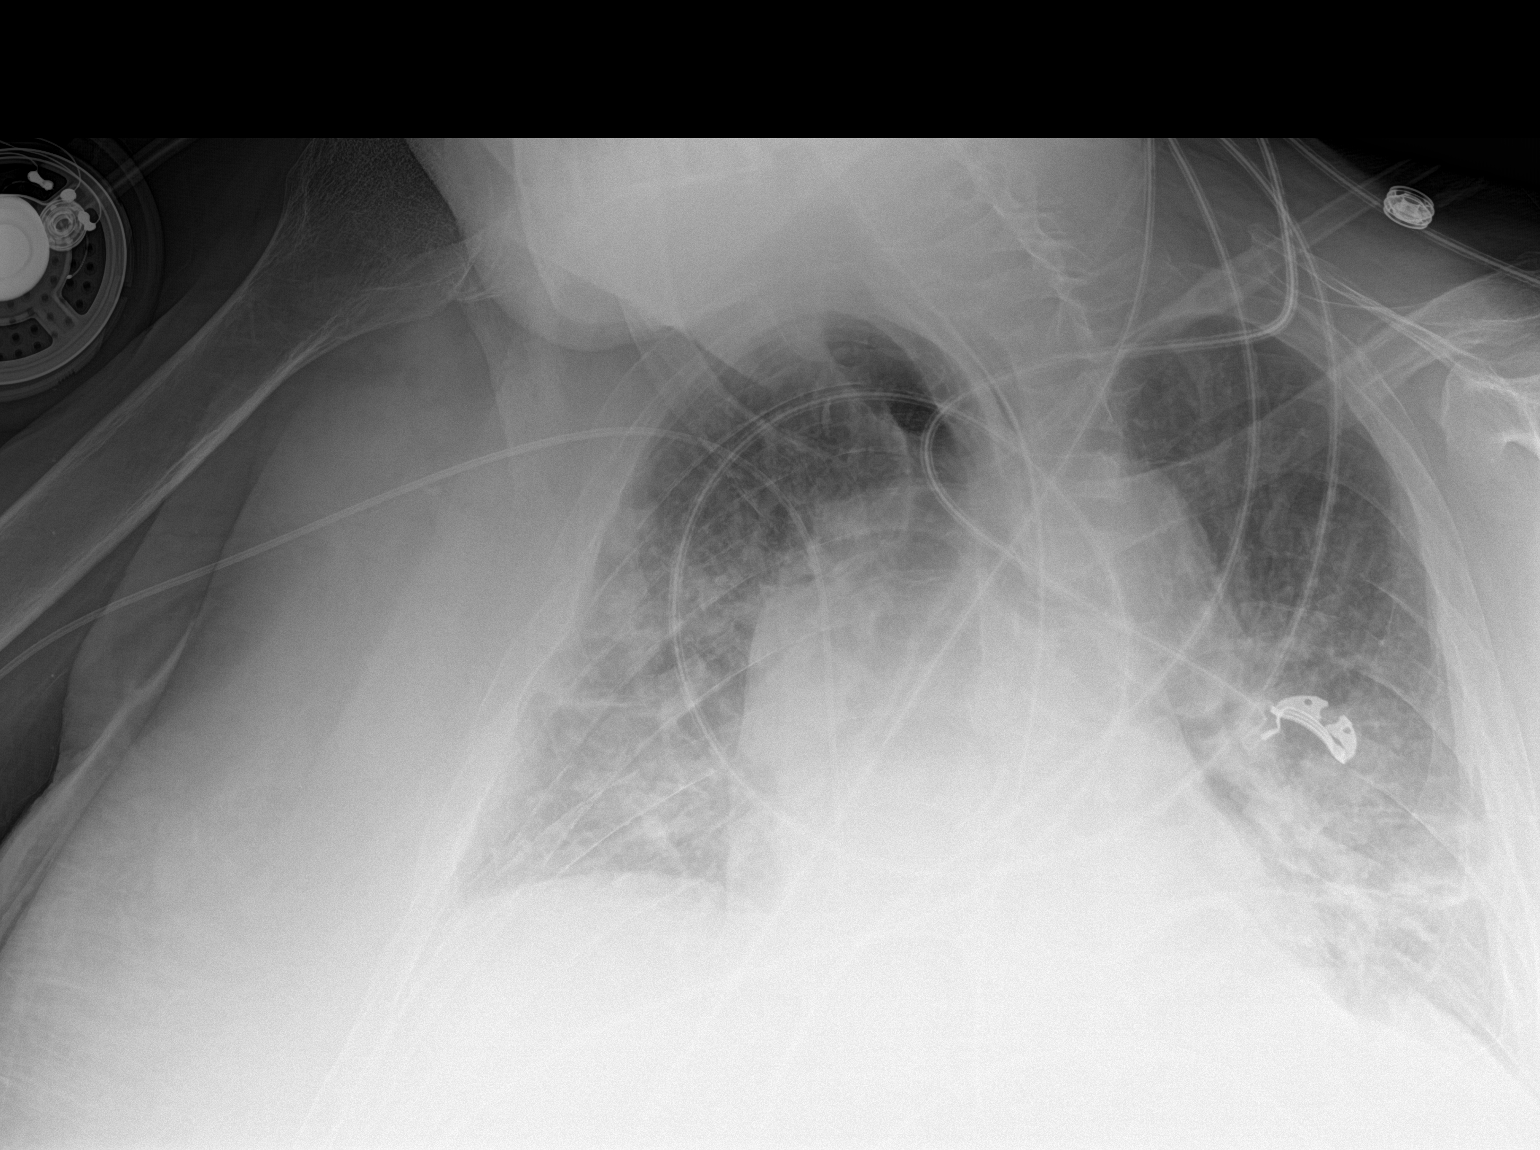

[2 of 2 positions shown; findings below may reference images not displayed]

FINDINGS: Cardiac shadow is stable. Right-sided PICC line and nasogastric
catheter are again seen and stable. Patchy infiltrative changes are
again seen bilaterally and stable. No new focal infiltrate is noted.
No significant effusion is seen. No bony abnormality is noted.
IMPRESSION: Stable patchy infiltrates bilaterally.
